# Patient Record
Sex: Male | Born: 1975 | Race: Black or African American | Hispanic: No | Marital: Single | Smoking: Former smoker
Health system: Southern US, Community
[De-identification: ages and names within clinical notes are randomized; demographics above are authoritative.]

## PROBLEM LIST (undated history)

## (undated) DIAGNOSIS — E785 Hyperlipidemia, unspecified: Secondary | ICD-10-CM

## (undated) DIAGNOSIS — J189 Pneumonia, unspecified organism: Secondary | ICD-10-CM

## (undated) DIAGNOSIS — B1920 Unspecified viral hepatitis C without hepatic coma: Secondary | ICD-10-CM

## (undated) DIAGNOSIS — K219 Gastro-esophageal reflux disease without esophagitis: Secondary | ICD-10-CM

## (undated) DIAGNOSIS — F4310 Post-traumatic stress disorder, unspecified: Secondary | ICD-10-CM

## (undated) DIAGNOSIS — F419 Anxiety disorder, unspecified: Secondary | ICD-10-CM

## (undated) DIAGNOSIS — F1910 Other psychoactive substance abuse, uncomplicated: Secondary | ICD-10-CM

## (undated) DIAGNOSIS — R7303 Prediabetes: Secondary | ICD-10-CM

## (undated) DIAGNOSIS — F32A Depression, unspecified: Secondary | ICD-10-CM

## (undated) DIAGNOSIS — F329 Major depressive disorder, single episode, unspecified: Secondary | ICD-10-CM

## (undated) DIAGNOSIS — I10 Essential (primary) hypertension: Secondary | ICD-10-CM

## (undated) DIAGNOSIS — M5430 Sciatica, unspecified side: Secondary | ICD-10-CM

## (undated) HISTORY — DX: Anxiety disorder, unspecified: F41.9

## (undated) HISTORY — DX: Sciatica, unspecified side: M54.30

## (undated) HISTORY — DX: Other psychoactive substance abuse, uncomplicated: F19.10

## (undated) HISTORY — DX: Post-traumatic stress disorder, unspecified: F43.10

## (undated) HISTORY — DX: Hyperlipidemia, unspecified: E78.5

## (undated) HISTORY — PX: OTHER SURGICAL HISTORY: SHX169

## (undated) HISTORY — PX: MOUTH SURGERY: SHX715

## (undated) HISTORY — PX: TESTICLE TORSION REDUCTION: SHX795

---

## 2012-02-24 DIAGNOSIS — F1193 Opioid use, unspecified with withdrawal: Secondary | ICD-10-CM

## 2012-02-24 DIAGNOSIS — F1220 Cannabis dependence, uncomplicated: Secondary | ICD-10-CM | POA: Insufficient documentation

## 2012-02-24 DIAGNOSIS — F1420 Cocaine dependence, uncomplicated: Secondary | ICD-10-CM

## 2012-02-24 HISTORY — DX: Opioid use, unspecified with withdrawal: F11.93

## 2012-02-24 HISTORY — DX: Cocaine dependence, uncomplicated: F14.20

## 2013-02-23 DIAGNOSIS — Z880 Allergy status to penicillin: Secondary | ICD-10-CM | POA: Insufficient documentation

## 2013-02-23 DIAGNOSIS — R45851 Suicidal ideations: Secondary | ICD-10-CM | POA: Insufficient documentation

## 2013-02-23 DIAGNOSIS — F1910 Other psychoactive substance abuse, uncomplicated: Secondary | ICD-10-CM | POA: Insufficient documentation

## 2013-02-23 DIAGNOSIS — F17200 Nicotine dependence, unspecified, uncomplicated: Secondary | ICD-10-CM | POA: Insufficient documentation

## 2013-02-23 DIAGNOSIS — Z8619 Personal history of other infectious and parasitic diseases: Secondary | ICD-10-CM | POA: Insufficient documentation

## 2013-02-23 DIAGNOSIS — F1010 Alcohol abuse, uncomplicated: Secondary | ICD-10-CM | POA: Insufficient documentation

## 2013-02-24 ENCOUNTER — Emergency Department (HOSPITAL_COMMUNITY)
Admission: EM | Admit: 2013-02-24 | Discharge: 2013-02-25 | Disposition: A | Discharge status: Other Acute Inpatient Hospital | Payer: Federal, State, Local not specified - Other | Attending: Emergency Medicine | Admitting: Emergency Medicine

## 2013-02-24 ENCOUNTER — Encounter (HOSPITAL_COMMUNITY): Payer: Self-pay | Admitting: Emergency Medicine

## 2013-02-24 DIAGNOSIS — F329 Major depressive disorder, single episode, unspecified: Secondary | ICD-10-CM

## 2013-02-24 DIAGNOSIS — F1010 Alcohol abuse, uncomplicated: Secondary | ICD-10-CM | POA: Diagnosis present

## 2013-02-24 DIAGNOSIS — F1910 Other psychoactive substance abuse, uncomplicated: Secondary | ICD-10-CM

## 2013-02-24 DIAGNOSIS — F1120 Opioid dependence, uncomplicated: Secondary | ICD-10-CM | POA: Diagnosis present

## 2013-02-24 HISTORY — DX: Unspecified viral hepatitis C without hepatic coma: B19.20

## 2013-02-24 LAB — CBC
HCT: 41.7 % (ref 39.0–52.0)
MCHC: 35.3 g/dL (ref 30.0–36.0)
MCV: 86.2 fL (ref 78.0–100.0)
Platelets: 281 10*3/uL (ref 150–400)
RDW: 13.1 % (ref 11.5–15.5)
WBC: 10.5 10*3/uL (ref 4.0–10.5)

## 2013-02-24 LAB — COMPREHENSIVE METABOLIC PANEL
AST: 19 U/L (ref 0–37)
Albumin: 3.8 g/dL (ref 3.5–5.2)
BUN: 8 mg/dL (ref 6–23)
CO2: 25 mEq/L (ref 19–32)
Chloride: 104 mEq/L (ref 96–112)
Creatinine, Ser: 1.11 mg/dL (ref 0.50–1.35)
Sodium: 138 mEq/L (ref 135–145)
Total Protein: 7.2 g/dL (ref 6.0–8.3)

## 2013-02-24 LAB — RAPID URINE DRUG SCREEN, HOSP PERFORMED
Benzodiazepines: NOT DETECTED
Cocaine: NOT DETECTED
Opiates: POSITIVE — AB

## 2013-02-24 LAB — ACETAMINOPHEN LEVEL: Acetaminophen (Tylenol), Serum: <15 ug/mL (ref 10–30)

## 2013-02-24 LAB — ETHANOL: Alcohol, Ethyl (B): 174 mg/dL — ABNORMAL HIGH (ref 0–11)

## 2013-02-24 LAB — SALICYLATE LEVEL: Salicylate Lvl: <2 mg/dL — ABNORMAL LOW (ref 2.8–20.0)

## 2013-02-24 MED ORDER — LORAZEPAM 1 MG PO TABS
1.0000 mg | ORAL_TABLET | Freq: Three times a day (TID) | ORAL | Status: DC | PRN
  Administered 2013-02-24: 1 mg via ORAL

## 2013-02-24 MED ORDER — LORAZEPAM 1 MG PO TABS
0.0000 mg | ORAL_TABLET | Freq: Four times a day (QID) | ORAL | Status: DC
  Filled 2013-02-24: qty 1

## 2013-02-24 MED ORDER — VITAMIN B-1 100 MG PO TABS
100.0000 mg | ORAL_TABLET | Freq: Every day | ORAL | Status: DC
  Administered 2013-02-25: 10:00:00 100 mg via ORAL
  Filled 2013-02-24: qty 1

## 2013-02-24 MED ORDER — CHLORDIAZEPOXIDE HCL 25 MG PO CAPS: 25.0000 mg | ORAL_CAPSULE | ORAL | Status: DC

## 2013-02-24 MED ORDER — CLONIDINE HCL 0.1 MG PO TABS: 0.1000 mg | ORAL_TABLET | ORAL | Status: DC

## 2013-02-24 MED ORDER — IBUPROFEN 200 MG PO TABS: 600.0000 mg | ORAL_TABLET | Freq: Three times a day (TID) | ORAL | Status: DC | PRN

## 2013-02-24 MED ORDER — LOPERAMIDE HCL 2 MG PO CAPS: 2.0000 mg | ORAL_CAPSULE | ORAL | Status: DC | PRN

## 2013-02-24 MED ORDER — CLONIDINE HCL 0.1 MG PO TABS
0.1000 mg | ORAL_TABLET | Freq: Four times a day (QID) | ORAL | Status: DC
  Administered 2013-02-24 – 2013-02-25 (×3): 0.1 mg via ORAL
  Filled 2013-02-24 (×4): qty 1

## 2013-02-24 MED ORDER — CHLORDIAZEPOXIDE HCL 25 MG PO CAPS
25.0000 mg | ORAL_CAPSULE | Freq: Three times a day (TID) | ORAL | Status: DC
  Administered 2013-02-25: 25 mg via ORAL
  Filled 2013-02-24: qty 1

## 2013-02-24 MED ORDER — NICOTINE 21 MG/24HR TD PT24: 21.0000 mg | MEDICATED_PATCH | Freq: Every day | TRANSDERMAL | Status: DC

## 2013-02-24 MED ORDER — CHLORDIAZEPOXIDE HCL 25 MG PO CAPS: 25.0000 mg | ORAL_CAPSULE | Freq: Four times a day (QID) | ORAL | Status: DC | PRN

## 2013-02-24 MED ORDER — CHLORDIAZEPOXIDE HCL 25 MG PO CAPS
25.0000 mg | ORAL_CAPSULE | Freq: Four times a day (QID) | ORAL | Status: AC
  Administered 2013-02-24 – 2013-02-25 (×3): 25 mg via ORAL
  Filled 2013-02-24 (×3): qty 1

## 2013-02-24 MED ORDER — ONDANSETRON HCL 4 MG PO TABS
4.0000 mg | ORAL_TABLET | Freq: Three times a day (TID) | ORAL | Status: DC | PRN
  Administered 2013-02-24: 4 mg via ORAL
  Filled 2013-02-24: qty 1

## 2013-02-24 MED ORDER — CHLORDIAZEPOXIDE HCL 25 MG PO CAPS: 25.0000 mg | ORAL_CAPSULE | Freq: Every day | ORAL | Status: DC

## 2013-02-24 MED ORDER — METHOCARBAMOL 500 MG PO TABS
500.0000 mg | ORAL_TABLET | Freq: Three times a day (TID) | ORAL | Status: DC | PRN
  Administered 2013-02-24: 500 mg via ORAL
  Filled 2013-02-24: qty 1

## 2013-02-24 MED ORDER — ONDANSETRON 4 MG PO TBDP: 4.0000 mg | ORAL_TABLET | Freq: Four times a day (QID) | ORAL | Status: DC | PRN

## 2013-02-24 MED ORDER — CHLORDIAZEPOXIDE HCL 25 MG PO CAPS
25.0000 mg | ORAL_CAPSULE | Freq: Once | ORAL | Status: AC
  Administered 2013-02-24: 25 mg via ORAL
  Filled 2013-02-24: qty 1

## 2013-02-24 MED ORDER — CLONIDINE HCL 0.1 MG PO TABS: 0.1000 mg | ORAL_TABLET | Freq: Every day | ORAL | Status: DC

## 2013-02-24 MED ORDER — HYDROXYZINE HCL 25 MG PO TABS
25.0000 mg | ORAL_TABLET | Freq: Four times a day (QID) | ORAL | Status: DC | PRN
  Administered 2013-02-24: 25 mg via ORAL
  Filled 2013-02-24: qty 1

## 2013-02-24 MED ORDER — DICYCLOMINE HCL 20 MG PO TABS
20.0000 mg | ORAL_TABLET | Freq: Four times a day (QID) | ORAL | Status: DC | PRN
  Administered 2013-02-24 – 2013-02-25 (×2): 20 mg via ORAL
  Filled 2013-02-24 (×2): qty 1

## 2013-02-24 MED ORDER — LORAZEPAM 1 MG PO TABS: 0.0000 mg | ORAL_TABLET | Freq: Two times a day (BID) | ORAL | Status: DC

## 2013-02-24 MED ORDER — NAPROXEN 500 MG PO TABS: 500.0000 mg | ORAL_TABLET | Freq: Two times a day (BID) | ORAL | Status: DC | PRN

## 2013-02-24 MED ORDER — ADULT MULTIVITAMIN W/MINERALS CH
1.0000 | ORAL_TABLET | Freq: Every day | ORAL | Status: DC
  Administered 2013-02-24 – 2013-02-25 (×2): 1 via ORAL
  Filled 2013-02-24 (×2): qty 1

## 2013-02-24 MED ORDER — THIAMINE HCL 100 MG/ML IJ SOLN
100.0000 mg | Freq: Once | INTRAMUSCULAR | Status: AC
  Administered 2013-02-24: 100 mg via INTRAMUSCULAR
  Filled 2013-02-24: qty 2

## 2013-02-24 NOTE — ED Notes (Signed)
Pt and belonging wanded.

## 2013-02-24 NOTE — ED Provider Notes (Signed)
CSN: [id number]     Arrival date & time 02/23/13  2339 History   First MD Initiated Contact with Patient 02/24/13 0043     Chief Complaint  Patient presents with  . Medical Clearance   (Consider location/radiation/quality/duration/timing/severity/associated sxs/prior Treatment) HPI Comments: Agent states he uses a plethora of drugs and alcohol.  States he spoke to a mark.  Today.  I was told to come to the hospital for medical clearance.  He he, states he thought about suicide, but has no active plan.  He thought about him is because nobody that he really wants to hurt.   The history is provided by the patient.    Past Medical History  Diagnosis Date  . Hepatitis C    History reviewed. No pertinent past surgical history. No family history on file. History  Substance Use Topics  . Smoking status: Current Every Day Smoker -- 1.00 packs/day    Types: Cigarettes  . Smokeless tobacco: Not on file  . Alcohol Use: Yes     Comment: "alot"    Review of Systems  Constitutional: Negative for fever.  Gastrointestinal: Negative for abdominal pain.  Neurological: Negative for dizziness and headaches.  Psychiatric/Behavioral: Negative for suicidal ideas, hallucinations and sleep disturbance. The patient is not nervous/anxious and is not hyperactive.   All other systems reviewed and are negative.    Allergies  Fish allergy and Penicillins  Home Medications   Current Outpatient Rx  Name  Route  Sig  Dispense  Refill  . ibuprofen (ADVIL,MOTRIN) 200 MG tablet   Oral   Take 600 mg by mouth every 6 (six) hours as needed for pain.          BP 120/72  Pulse 58  Temp(Src) 97.6 F (36.4 C) (Oral)  Resp 16  SpO2 94% Physical Exam  Nursing note and vitals reviewed. Constitutional: He is oriented to person, place, and time. He appears well-developed and well-nourished.  HENT:  Head: Normocephalic.  Eyes: Pupils are equal, round, and reactive to light.  Neck: Normal range of  motion.  Cardiovascular: Normal rate and regular rhythm.   Pulmonary/Chest: Effort normal.  Musculoskeletal: Normal range of motion.  Neurological: He is alert and oriented to person, place, and time.  Skin: Skin is warm.  Psychiatric: He has a normal mood and affect. Judgment normal. His speech is slurred. Cognition and memory are normal. He expresses no suicidal plans and no homicidal plans.    ED Course  Procedures (including critical care time) Labs Review Labs Reviewed  ACETAMINOPHEN LEVEL  CBC  COMPREHENSIVE METABOLIC PANEL  ETHANOL  SALICYLATE LEVEL  URINE RAPID DRUG SCREEN (HOSP PERFORMED)   Imaging Review No results found.  EKG Interpretation   None       MDM  No diagnosis found.  Polysubstance abuse    Arman Filter, NP 02/24/13 [phone]

## 2013-02-24 NOTE — Consult Note (Signed)
[facility] and [facility] will not accept pt due to seizure history indicated in assessment.  While it is uncertain whether or not pt does in fact have a seizure hx or it is related to detox is not relevant to [facility] and [facility] protocol.    Pt can either remain in ER to detox and then go to [facility] per pt plan or be considered for [facility] once they have a bed.
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
## 2013-02-24 NOTE — BH Assessment (Signed)
Dr. Mervyn Gay accepted Pt pending 300-hall bed. Per Laverle Hobby, [facility], adult unit is at capacity. Contacted the following facilities for placement:  [facility]: At capacity [facility]: At capacity [facility]: At capacity [facility]: At capacity [facility] At [facility]: At capacity [facility]: At capacity [facility]: At capacity [facility]: At capacity  [facility]: Does not accept detox patients [facility]: Does not accept detox patients [facility]: Declined due to history of seizures [facility]: Declined due to history of seizures   Pamalee Leyden, [facility], [facility] Triage Specialist

## 2013-02-24 NOTE — ED Provider Notes (Signed)
Medical screening examination/treatment/procedure(s) were performed by non-physician practitioner and as supervising physician I was immediately available for consultation/collaboration.  Braley Luckenbaugh M Alila Sotero, MD 02/24/13 0301 

## 2013-02-24 NOTE — ED Notes (Signed)
Pt states "I need help getting off drugs";  When asked what drugs pt states "all of them"; denies SI / HI; + ETOH

## 2013-02-24 NOTE — ED Notes (Signed)
Spoke with Shuvon Rankin NP about giving loading dose of Librium now, stated OK to give.
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
## 2013-02-25 ENCOUNTER — Inpatient Hospital Stay (HOSPITAL_COMMUNITY)
Admission: AD | Admit: 2013-02-25 | Discharge: 2013-03-05 | DRG: 897 | Disposition: A | Discharge status: Home / Self Care | Payer: Federal, State, Local not specified - Other | Source: Intra-hospital | Attending: Psychiatry | Admitting: Psychiatry

## 2013-02-25 ENCOUNTER — Encounter (HOSPITAL_COMMUNITY): Payer: Self-pay | Admitting: *Deleted

## 2013-02-25 DIAGNOSIS — F1994 Other psychoactive substance use, unspecified with psychoactive substance-induced mood disorder: Secondary | ICD-10-CM | POA: Diagnosis present

## 2013-02-25 DIAGNOSIS — B1920 Unspecified viral hepatitis C without hepatic coma: Secondary | ICD-10-CM | POA: Diagnosis present

## 2013-02-25 DIAGNOSIS — F1010 Alcohol abuse, uncomplicated: Secondary | ICD-10-CM

## 2013-02-25 DIAGNOSIS — F4310 Post-traumatic stress disorder, unspecified: Secondary | ICD-10-CM | POA: Diagnosis present

## 2013-02-25 DIAGNOSIS — F1120 Opioid dependence, uncomplicated: Secondary | ICD-10-CM | POA: Diagnosis present

## 2013-02-25 DIAGNOSIS — F39 Unspecified mood [affective] disorder: Secondary | ICD-10-CM

## 2013-02-25 DIAGNOSIS — F1920 Other psychoactive substance dependence, uncomplicated: Principal | ICD-10-CM | POA: Diagnosis present

## 2013-02-25 DIAGNOSIS — Z79899 Other long term (current) drug therapy: Secondary | ICD-10-CM

## 2013-02-25 MED ORDER — NICOTINE 21 MG/24HR TD PT24
21.0000 mg | MEDICATED_PATCH | Freq: Every day | TRANSDERMAL | Status: DC
  Administered 2013-02-26 – 2013-03-04 (×8): 21 mg via TRANSDERMAL
  Filled 2013-02-25 (×11): qty 1

## 2013-02-25 MED ORDER — DICYCLOMINE HCL 20 MG PO TABS: 20.0000 mg | ORAL_TABLET | Freq: Four times a day (QID) | ORAL | Status: AC | PRN

## 2013-02-25 MED ORDER — CHLORDIAZEPOXIDE HCL 25 MG PO CAPS
25.0000 mg | ORAL_CAPSULE | Freq: Once | ORAL | Status: DC
  Filled 2013-02-25 (×2): qty 1

## 2013-02-25 MED ORDER — ADULT MULTIVITAMIN W/MINERALS CH
1.0000 | ORAL_TABLET | Freq: Every day | ORAL | Status: DC
  Administered 2013-02-26 – 2013-03-04 (×7): 1 via ORAL
  Filled 2013-02-25 (×10): qty 1

## 2013-02-25 MED ORDER — METHOCARBAMOL 500 MG PO TABS
500.0000 mg | ORAL_TABLET | Freq: Three times a day (TID) | ORAL | Status: AC | PRN
  Administered 2013-02-26 – 2013-03-02 (×5): 500 mg via ORAL
  Filled 2013-02-25 (×4): qty 1

## 2013-02-25 MED ORDER — THIAMINE HCL 100 MG/ML IJ SOLN: 100.0000 mg | Freq: Once | INTRAMUSCULAR | Status: DC

## 2013-02-25 MED ORDER — CHLORDIAZEPOXIDE HCL 25 MG PO CAPS
25.0000 mg | ORAL_CAPSULE | Freq: Three times a day (TID) | ORAL | Status: AC
  Administered 2013-02-27 (×3): 25 mg via ORAL
  Filled 2013-02-25 (×3): qty 1

## 2013-02-25 MED ORDER — CLONIDINE HCL 0.1 MG PO TABS
0.1000 mg | ORAL_TABLET | ORAL | Status: AC
  Administered 2013-02-28 – 2013-03-01 (×4): 0.1 mg via ORAL
  Filled 2013-02-25 (×4): qty 1

## 2013-02-25 MED ORDER — MAGNESIUM HYDROXIDE 400 MG/5ML PO SUSP
30.0000 mL | Freq: Every day | ORAL | Status: DC | PRN
  Administered 2013-02-28: 30 mL via ORAL

## 2013-02-25 MED ORDER — NAPROXEN 500 MG PO TABS
500.0000 mg | ORAL_TABLET | Freq: Two times a day (BID) | ORAL | Status: AC | PRN
  Administered 2013-02-26 – 2013-02-27 (×2): 500 mg via ORAL
  Filled 2013-02-25 (×2): qty 1

## 2013-02-25 MED ORDER — VITAMIN B-1 100 MG PO TABS
100.0000 mg | ORAL_TABLET | Freq: Every day | ORAL | Status: DC
  Administered 2013-02-26 – 2013-03-04 (×7): 100 mg via ORAL
  Filled 2013-02-25 (×9): qty 1

## 2013-02-25 MED ORDER — CHLORDIAZEPOXIDE HCL 25 MG PO CAPS: 25.0000 mg | ORAL_CAPSULE | Freq: Four times a day (QID) | ORAL | Status: AC | PRN

## 2013-02-25 MED ORDER — ACETAMINOPHEN 325 MG PO TABS: 650.0000 mg | ORAL_TABLET | Freq: Four times a day (QID) | ORAL | Status: DC | PRN

## 2013-02-25 MED ORDER — CHLORDIAZEPOXIDE HCL 25 MG PO CAPS
25.0000 mg | ORAL_CAPSULE | Freq: Every day | ORAL | Status: AC
Start: 2013-03-01 — End: 2013-03-01
  Administered 2013-03-01: 25 mg via ORAL
  Filled 2013-02-25: qty 1

## 2013-02-25 MED ORDER — CHLORDIAZEPOXIDE HCL 25 MG PO CAPS
25.0000 mg | ORAL_CAPSULE | ORAL | Status: AC
  Administered 2013-02-28 (×2): 25 mg via ORAL
  Filled 2013-02-25 (×2): qty 1

## 2013-02-25 MED ORDER — CLONIDINE HCL 0.1 MG PO TABS
0.1000 mg | ORAL_TABLET | Freq: Every day | ORAL | Status: AC
  Administered 2013-03-02 – 2013-03-03 (×2): 0.1 mg via ORAL
  Filled 2013-02-25 (×2): qty 1

## 2013-02-25 MED ORDER — ALUM & MAG HYDROXIDE-SIMETH 200-200-20 MG/5ML PO SUSP: 30.0000 mL | ORAL | Status: DC | PRN

## 2013-02-25 MED ORDER — CLONIDINE HCL 0.1 MG PO TABS
0.1000 mg | ORAL_TABLET | Freq: Four times a day (QID) | ORAL | Status: AC
  Administered 2013-02-25 – 2013-02-27 (×10): 0.1 mg via ORAL
  Filled 2013-02-25 (×10): qty 1

## 2013-02-25 MED ORDER — CHLORDIAZEPOXIDE HCL 25 MG PO CAPS
25.0000 mg | ORAL_CAPSULE | Freq: Four times a day (QID) | ORAL | Status: AC
  Administered 2013-02-25 – 2013-02-26 (×6): 25 mg via ORAL
  Filled 2013-02-25 (×5): qty 1

## 2013-02-25 NOTE — Tx Team (Signed)
Initial Interdisciplinary Treatment Plan  PATIENT STRENGTHS: (choose at least two) Ability for insight Capable of independent living General fund of knowledge Physical Health Work skills  PATIENT STRESSORS: Financial difficulties Marital or family conflict Substance abuse   PROBLEM LIST: Problem List/Patient Goals Date to be addressed Date deferred Reason deferred Estimated date of resolution  Substance abuse 02/25/13                                                      DISCHARGE CRITERIA:  Ability to meet basic life and health needs Improved stabilization in mood, thinking, and/or behavior Motivation to continue treatment in a less acute level of care Need for constant or close observation no longer present Reduction of life-threatening or endangering symptoms to within safe limits Verbal commitment to aftercare and medication compliance Withdrawal symptoms are absent or subacute and managed without 24-hour nursing intervention  PRELIMINARY DISCHARGE PLAN: Attend aftercare/continuing care group Attend 12-step recovery group  PATIENT/FAMIILY INVOLVEMENT: This treatment plan has been presented to and reviewed with the patient, Todd [name], and/or family member, .  The patient and family have been given the opportunity to ask questions and make suggestions.  Todd [name] 02/25/2013, 5:19 PM

## 2013-02-25 NOTE — Consult Note (Signed)
  Patient Identification:  Todd [name] Date of Evaluation:  02/25/2013   History of Present Illness: Patient is seeking detox from multiple drugs and alcohol.  Patient states he has been using different drugs and alcohol since age 37.   He also reports long standing depression with out treatment. He states he is tired of wasting his money and need detox.  We will use our Librium protocol for his detox.  He denies SI/HI/AVH.   Past Psychiatric History:  Polysubstance dependence   Past Medical History:     Past Medical History  Diagnosis Date  . Hepatitis C       History reviewed. No pertinent past surgical history.  Allergies:  Allergies  Allergen Reactions  . Fish Allergy Anaphylaxis  . Penicillins Anaphylaxis    Current Medications:  Prior to Admission medications   Medication Sig Start Date End Date Taking? Authorizing Provider  ibuprofen (ADVIL,MOTRIN) 200 MG tablet Take 600 mg by mouth every 6 (six) hours as needed for pain.   Yes Historical Provider, MD    Social History:    reports that he has been smoking Cigarettes.  He has been smoking about 1.00 pack per day. He does not have any smokeless tobacco history on file. He reports that he drinks alcohol. He reports that he uses illicit drugs.   Family History:    No family history on file.  Mental Status Examination/Evaluation:Psychiatric Specialty Exam: Physical Exam  ROS  Blood pressure 138/88, pulse 50, temperature 98.7 F (37.1 C), temperature source Oral, resp. rate 22, SpO2 98.00%.There is no height or weight on file to calculate BMI.  General Appearance: Casual  Eye Contact::  Good  Speech:  Clear and Coherent and Normal Rate  Volume:  Normal  Mood:  Depressed  Affect:  Congruent, Depressed and Flat  Thought Process:  Coherent and Goal Directed  Orientation:  Full (Time, Place, and Person)  Thought Content:  NA  Suicidal Thoughts:  No  Homicidal Thoughts:  No  Memory:  Immediate;   Good Recent;    Good Remote;   Good  Judgement:  Fair  Insight:  Fair  Psychomotor Activity:  Tremor  Concentration:  Good  Recall:  NA  Akathisia:  NA  Handed:  Right  AIMS (if indicated):     Assets:  Desire for Improvement  Sleep:          DIAGNOSIS:   AXIS I   Depressive Disorder NOS and Substance Abuse   AXIS II  Deffered  AXIS III See medical notes.  AXIS IV other psychosocial or environmental problems and problems related to social environment  AXIS V 41-50 serious symptoms     Assessment/Plan:  Face to face interview with Dr Lolly Mustache We have accepted patient to our inpatient psychiatric unit for detox with librium protocol.  I have personally seen the patient and agreed with the findings and involved in the treatment plan. Kathryne Sharper, MD

## 2013-02-25 NOTE — Progress Notes (Signed)
Pt observed in the dayroom watching TV and interacting with his peers.  Pt reports he is doing fine at this point.  He denies any withdrawal symptoms at this time.  He denies SI/HI/AV.  Pt was encouraged to make his needs known to staff and reminded that he was on a protocol for withdrawal symptoms.  Pt voiced understanding.  Support and encouragement offered.  Safety maintained with q15 minute checks.

## 2013-02-25 NOTE — Progress Notes (Signed)
Attended AA group 

## 2013-02-25 NOTE — Progress Notes (Signed)
[facility] provided patient with a [employer] Orange Card application, highlighting [facility].  °
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
## 2013-02-26 ENCOUNTER — Encounter (HOSPITAL_COMMUNITY): Payer: Self-pay | Admitting: Psychiatry

## 2013-02-26 DIAGNOSIS — F411 Generalized anxiety disorder: Secondary | ICD-10-CM

## 2013-02-26 DIAGNOSIS — F1994 Other psychoactive substance use, unspecified with psychoactive substance-induced mood disorder: Secondary | ICD-10-CM | POA: Diagnosis present

## 2013-02-26 DIAGNOSIS — F39 Unspecified mood [affective] disorder: Secondary | ICD-10-CM

## 2013-02-26 DIAGNOSIS — F4310 Post-traumatic stress disorder, unspecified: Secondary | ICD-10-CM | POA: Diagnosis present

## 2013-02-26 MED ORDER — TRAZODONE HCL 100 MG PO TABS
200.0000 mg | ORAL_TABLET | Freq: Every day | ORAL | Status: DC
  Administered 2013-02-26 – 2013-03-04 (×7): 200 mg via ORAL
  Filled 2013-02-26 (×8): qty 2

## 2013-02-26 MED ORDER — QUETIAPINE FUMARATE 50 MG PO TABS
50.0000 mg | ORAL_TABLET | Freq: Four times a day (QID) | ORAL | Status: DC | PRN
  Administered 2013-02-26 – 2013-03-04 (×8): 50 mg via ORAL
  Filled 2013-02-26 (×5): qty 1

## 2013-02-26 MED ORDER — GABAPENTIN 100 MG PO CAPS
100.0000 mg | ORAL_CAPSULE | Freq: Three times a day (TID) | ORAL | Status: DC
  Administered 2013-02-26 – 2013-02-27 (×3): 100 mg via ORAL
  Filled 2013-02-26 (×6): qty 1

## 2013-02-26 NOTE — Progress Notes (Signed)
The focus of this group is to educate the patient on the purpose and policies of crisis stabilization and provide a format to answer questions about their admission.  The group details unit policies and expectations of patients while admitted.  Patient attended the group, but was called out by Dr. Dub Mikes and did not return until the group was nearly over.
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
## 2013-02-26 NOTE — Progress Notes (Signed)
Pt attended A/A 

## 2013-02-26 NOTE — BHH Suicide Risk Assessment (Addendum)
BHH INPATIENT: Family/Significant Other Suicide Prevention Education  Suicide Prevention Education:  Education Completed; No one has been identified by the patient as the family member/significant other with whom the patient will be residing, and identified as the person(s) who will aid the patient in the event of a mental health crisis (suicidal ideations/suicide attempt).   Pt refused to consent to family contact for SPE. SPE completed with pt. SPI pamphlet provided to pt and he was encouraged to ask questions, talk about concerns, and share with his support network.   The Sherwin-Bidinger, LCSWA 02/26/2013 10:47 AM

## 2013-02-26 NOTE — Progress Notes (Signed)
Recreation Therapy Notes  Date: 10.14.2014 Time: 3:00pm Location: [address] Dayroom  Group Topic: Communication, Team Building, Problem Solving  Goal Area(s) Addresses:  Patient will effectively work with peer towards shared goal.  Patient will identify skill used to make activity successful.  Patient will identify how skills used during activity can be used to reach post d/c goals.   Behavioral Response: Engaged, Attentive, Apporpriate  Intervention: Problem Solving Activitiy  Activity: Life Boat. Patients were given a scenario about being on a sinking yacht. Patients were informed the yacht included 15 guest, 8 of which could be placed on the life boat, along with all group members. Individuals on guest list were of varying socioeconomic classes such as a Education officer, museum, Materials engineer, Midwife, Tree surgeon.   Education: Customer service manager, Discharge Planning   Education Outcome: Acknowledges understanding  Clinical Observations/Feedback: Patient actively engaged in activity, voicing his opinion and debating appropriately with group members. Patient identified skill set and logic as qualities that guided decision making process. Patient additionally identified problem solving as a skill necessary to make activity successful. Patient identified importance of using skills used in group session to facilitate recovery.    Marykay Lex Larenda Reedy, LRT/CTRS  Jearl Klinefelter 02/26/2013 4:51 PM
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
## 2013-02-26 NOTE — Progress Notes (Signed)
D:  Patient up and active in the milieu most of the shift.  Attending and participating in groups.  Rates depression at 7 and hopelessness at 8, but affect has been bright most of the shift.  States sleep is fair.  Has stayed up today.  A:  Medications given as prescribed.  Encouraged participation in all groups.  Educated on specific medications.   R:  Cooperative with staff.  Interacting well with peers.  Tolerating medications.  Safety is maintained.
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
## 2013-02-26 NOTE — Progress Notes (Signed)
Adult Psychoeducational Group Note  Date:  02/26/2013 Time:  1:34 PM  Group Topic/Focus:  Recovery Goals:   The focus of this group is to identify appropriate goals for recovery and establish a plan to achieve them.  Participation Level:  Active  Participation Quality:  Appropriate, Drowsy, Sharing and Supportive  Affect:  Appropriate and Flat  Cognitive:  Appropriate  Insight: Appropriate and Good  Engagement in Group:  Engaged and Supportive  Modes of Intervention:  Discussion and Education  Additional Comments:  Pt stated one change he wants to make is change his friends and the people he hangs around. Pt stated he also wants to get a job and keep a job. Pt stated he doesn't have a problem getting a job, only keeping it.  Caswell Corwin 02/26/2013, 1:34 PM
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
## 2013-02-26 NOTE — [facility] Group Notes (Signed)
[facility] LCSW Group Therapy  02/26/2013 1:28 PM  Type of Therapy:  Group Therapy  Participation Level:  Minimal   Participation Quality:  Drowsy  Affect:  Lethargic  Cognitive:  Limited   Insight:  Limited  Engagement in Therapy:  Limited  Modes of Intervention:  Discussion, Education, Socialization and Support  Summary of Progress/Problems: MHA Speaker came to talk about his personal journey with substance abuse and addiction. The pt processed ways by which to relate to the speaker. MHA speaker provided handouts and educational information pertaining to groups and services offered by the [facility]. Todd [name] was drowsy and inattentive throughout today's therapy group. Todd [name] quickly fell asleep and rested quietly throughout speaker's presentation. He does not appear to be making progress in the group setting at this time.   Smart, Todd [name] 02/26/2013, 1:28 PM

## 2013-02-26 NOTE — Progress Notes (Signed)
Recreation Therapy Notes  Date: 10.14.2014 Time: 2:30pm Location: [address] Dayroom  Group Topic: Software engineer Activities (AAA)  Behavioral Response: Engaged, Attentive  Affect: Euthymic  Clinical Observations/Feedback: Dog Team: Education officer, museum. Patient interacted appropriately with peer, dog team, LRT and MHT.   Marykay Lex Marciano Mundt, LRT/CTRS  Jearl Klinefelter 02/26/2013 3:56 PM

## 2013-02-27 MED ORDER — QUETIAPINE FUMARATE 50 MG PO TABS
50.0000 mg | ORAL_TABLET | Freq: Three times a day (TID) | ORAL | Status: DC
  Administered 2013-02-27 – 2013-03-01 (×7): 50 mg via ORAL
  Filled 2013-02-27 (×10): qty 1

## 2013-02-27 MED ORDER — GABAPENTIN 100 MG PO CAPS
200.0000 mg | ORAL_CAPSULE | Freq: Three times a day (TID) | ORAL | Status: DC
  Administered 2013-02-27 – 2013-02-28 (×4): 200 mg via ORAL
  Filled 2013-02-27 (×6): qty 2

## 2013-02-27 NOTE — Progress Notes (Signed)
[facility] MD Progress Note  02/27/2013 2:42 PM Todd [name]  MRN:  [id number] Subjective:  Todd [name] continues to endorse anxiety, agitation. He is having a hard time with the withdrawal. He would like more help with the anxiety, the agitation. States that when it gets to this point he feels it is all hopeless, and starts feeling like giving up. This state of mind sets the stage for feeling more depressed, and suicidal. Sleep is still an issue although he slept some better last night. Diagnosis:   DSM5: Schizophrenia Disorders:   Obsessive-Compulsive Disorders:   Trauma-Stressor Disorders:  Posttraumatic Stress Disorder (309.81) Substance/Addictive Disorders:  Alcohol Related Disorder - Severe (303.90), Cannabis Use Disorder - Moderate 9304.30) and Opioid Disorder - Severe (304.00) Depressive Disorders:  Major Depressive Disorder - Moderate (296.22)  Axis I: Anxiety Disorder NOS and Mood Disorder NOS  ADL's:  Intact  Sleep: Fair  Appetite:  Fair  Suicidal Ideation:  Plan:  denies Intent:  denies Means:  denies Homicidal Ideation:  Plan:  denies Intent:  denies Means:  denies AEB (as evidenced by):  Psychiatric Specialty Exam: Review of Systems  Constitutional: Positive for malaise/fatigue.  HENT: Negative.   Eyes: Negative.   Respiratory: Negative.   Cardiovascular: Negative.   Gastrointestinal: Negative.   Genitourinary: Negative.   Musculoskeletal: Positive for myalgias.  Skin: Negative.   Neurological: Positive for dizziness.  Endo/Heme/Allergies: Negative.   Psychiatric/Behavioral: Positive for depression and substance abuse. The patient is nervous/anxious and has insomnia.     Blood pressure 130/91, pulse 93, temperature 97.1 F (36.2 C), temperature source Oral, resp. rate 18, height 6' (1.829 m), weight 89.359 kg (197 lb).Body mass index is 26.71 kg/(m^2).  General Appearance: Fairly Groomed  Patent attorney::  Fair  Speech:  Clear and Coherent  Volume:  Decreased   Mood:  Anxious, Depressed, Dysphoric and Irritable  Affect:  Restricted and anxious, worried  Thought Process:  Coherent and Goal Directed  Orientation:  Full (Time, Place, and Person)  Thought Content:  worries, concerns, symptoms, fear of losing control  Suicidal Thoughts:  Intermittent  Homicidal Thoughts:  No  Memory:  Immediate;   Fair Recent;   Fair Remote;   Fair  Judgement:  Fair  Insight:  Present and Shallow  Psychomotor Activity:  Restlessness  Concentration:  Fair  Recall:  Fair  Akathisia:  No  Handed:    AIMS (if indicated):     Assets:  Desire for Improvement  Sleep:  Number of Hours: 6.75   Current Medications: Current Facility-Administered Medications  Medication Dose Route Frequency Provider Last Rate Last Dose  . alum & mag hydroxide-simeth (MAALOX/MYLANTA) 200-200-20 MG/5ML suspension 30 mL  30 mL Oral Q4H PRN Nanine Means, NP      . chlordiazePOXIDE (LIBRIUM) capsule 25 mg  25 mg Oral Q6H PRN Nanine Means, NP      . chlordiazePOXIDE (LIBRIUM) capsule 25 mg  25 mg Oral Once Nanine Means, NP      . chlordiazePOXIDE (LIBRIUM) capsule 25 mg  25 mg Oral TID Nanine Means, NP   25 mg at 02/27/13 1210   Followed by  . [START ON 02/28/2013] chlordiazePOXIDE (LIBRIUM) capsule 25 mg  25 mg Oral BH-qamhs Nanine Means, NP       Followed by  . [START ON 03/01/2013] chlordiazePOXIDE (LIBRIUM) capsule 25 mg  25 mg Oral Daily Nanine Means, NP      . cloNIDine (CATAPRES) tablet 0.1 mg  0.1 mg Oral QID Nanine Means, NP   0.1  mg at 02/27/13 1207   Followed by  . [START ON 02/28/2013] cloNIDine (CATAPRES) tablet 0.1 mg  0.1 mg Oral BH-qamhs Nanine Means, NP       Followed by  . [START ON 03/02/2013] cloNIDine (CATAPRES) tablet 0.1 mg  0.1 mg Oral QAC breakfast Nanine Means, NP      . dicyclomine (BENTYL) tablet 20 mg  20 mg Oral Q6H PRN Nanine Means, NP      . gabapentin (NEURONTIN) capsule 200 mg  200 mg Oral TID Rachael Fee, MD   200 mg at 02/27/13 1207  . magnesium  hydroxide (MILK OF MAGNESIA) suspension 30 mL  30 mL Oral Daily PRN Nanine Means, NP      . methocarbamol (ROBAXIN) tablet 500 mg  500 mg Oral Q8H PRN Nanine Means, NP   500 mg at 02/27/13 1610  . multivitamin with minerals tablet 1 tablet  1 tablet Oral Daily Nanine Means, NP   1 tablet at 02/27/13 0910  . naproxen (NAPROSYN) tablet 500 mg  500 mg Oral BID PRN Nanine Means, NP   500 mg at 02/27/13 0917  . nicotine (NICODERM CQ - dosed in mg/24 hours) patch 21 mg  21 mg Transdermal Daily Nanine Means, NP   21 mg at 02/27/13 1208  . QUEtiapine (SEROQUEL) tablet 50 mg  50 mg Oral Q6H PRN Rachael Fee, MD   50 mg at 02/26/13 2123  . QUEtiapine (SEROQUEL) tablet 50 mg  50 mg Oral TID Rachael Fee, MD   50 mg at 02/27/13 1206  . thiamine (B-1) injection 100 mg  100 mg Intramuscular Once Nanine Means, NP      . thiamine (VITAMIN B-1) tablet 100 mg  100 mg Oral Daily Nanine Means, NP   100 mg at 02/27/13 0912  . traZODone (DESYREL) tablet 200 mg  200 mg Oral QHS Rachael Fee, MD   200 mg at 02/26/13 2122    Lab Results: No results found for this or any previous visit (from the past 48 hour(s)).  Physical Findings: AIMS: Facial and Oral Movements Muscles of Facial Expression: None, normal Lips and Perioral Area: None, normal Jaw: None, normal Tongue: None, normal,Extremity Movements Upper (arms, wrists, hands, fingers): None, normal Lower (legs, knees, ankles, toes): None, normal, Trunk Movements Neck, shoulders, hips: None, normal, Overall Severity Severity of abnormal movements (highest score from questions above): None, normal Incapacitation due to abnormal movements: None, normal Patient's awareness of abnormal movements (rate only patient's report): No Awareness, Dental Status Current problems with teeth and/or dentures?: No Does patient usually wear dentures?: No  CIWA:  CIWA-Ar Total: 0 COWS:  COWS Total Score: 1  Treatment Plan Summary: Daily contact with patient to assess and  evaluate symptoms and progress in treatment Medication management  Plan: Supportive approach/coping skills/relapse prevention           Continue detox protocol           Seroquel 50 mg TID  Medical Decision Making Problem Points:  Review of psycho-social stressors (1) Data Points:  Review of medication regiment & side effects (2) Review of new medications or change in dosage (2)  I certify that inpatient services furnished can reasonably be expected to improve the patient's condition.   Todd [name] A 02/27/2013, 2:42 PM

## 2013-02-27 NOTE — [facility] Group Notes (Signed)
[facility] LCSW Aftercare Discharge Planning Group Note   02/27/2013 9:30 AM  Participation Quality:  Appropriate   Mood/Affect:  Lethargic   Depression Rating:  7  Anxiety Rating:  8  Thoughts of Suicide:  No Will you contract for safety?   NA  Current AVH:  No  Plan for Discharge/Comments:  Pt reporting severe withdrawal symptoms today and stated that he slept poorly last night. Pt has Tues. Admission into [facility] and plans to follow up at [facility] for med management. He has screening on Friday at [facility]. [facility] referral sent as well.   Transportation Means: bus   Supports: none identified  Counselling psychologist, [employer]

## 2013-02-27 NOTE — [facility] Group Notes (Signed)
[facility] LCSW Group Therapy  02/27/2013 3:11 PM  Type of Therapy:  Group Therapy  Participation Level:  Did Not Attend  Smart, Todd [name] 02/27/2013, 3:11 PM

## 2013-02-27 NOTE — Progress Notes (Signed)
D: Patient denies SI/HI or AVH. Patient has a depressed mood and a flat affect.   A: Patient given emotional support from RN. Patient encouraged to come to staff with concerns and/or questions. Patient's medication routine continued. Patient's orders and plan of care reviewed.   R: Patient remains appropriate and cooperative. Will continue to monitor patient q15 minutes for safety.

## 2013-02-27 NOTE — Tx Team (Signed)
Interdisciplinary Treatment Plan Update (Adult)  Date: 02/27/2013   Time Reviewed: 10:58 AM  Progress in Treatment:  Attending groups: Yes Participating in groups:  Minimally   Taking medication as prescribed: Yes  Tolerating medication: Yes  Family/Significant othe contact made: No. SPE not required for this pt.  Patient understands diagnosis: Yes, AEB seeking treatment for ETOH detox and depression.  Discussing patient identified problems/goals with staff: Yes  Medical problems stabilized or resolved: Yes  Denies suicidal/homicidal ideation: Yes during admission, group, and self report.  Patient has not harmed self or Others: Yes  New problem(s) identified: Pt reporting severe withdrawals and difficulty sleeping. Pt has history of seizures and would not be good candidate for [facility].   Discharge Plan or Barriers: Pt has admission date for [facility] on Tues. 10/21 and plans to follow up at [facility] for med management. Pt reports that he will not make it if d/ced prior to [facility] date. CSW gave pt list of shelters/[place] [facility] in order to prepare for possible d/c prior to Tuesday.  Additional comments: Patient came to [facility] seeking assistance with detox. Pt had gotten in touch with [facility] and they said he had to be detoxed before coming in for rehabilitation bed. Patient has no SI, HI or A/V hallucinations. Patient is using a plethora of drugs along with ETOH. Patient reports drinking at least a 5th of rum daily over the last year. He last drank tonight (10/11) prior to arrival. Patient reports using "about a gram" of IV heroin daily for the last 7 months. Last use of heroin was 10/11 also. Patient uses cocaine and reports using about $70-$100 2-3 times in a week. Patient uses marijuana daily also. Patient did report that he had a seizure about 5 years ago. Questionable about whether it was during detox. No other seizure activity reported. Patient reports being in a detox facility in [facility]  about a year ago. Patient repeatedly asked for help with his SA problem. Reason for Continuation of Hospitalization: Librium/Clonidine taper-withdrawals Mood stabilization Medication management  Estimated length of stay: 3-5 days  For review of initial/current patient goals, please see plan of care.  Attendees:  Patient:    Family:    Physician: Geoffery Lyons MD 02/27/2013 10:57 AM   Nursing: Lupita Leash RN  02/27/2013 10:57 AM   Clinical Social Worker Kalil Woessner Smart, LCSWA  02/27/2013 10:57 AM   Other: Cicero Duck RN 02/27/2013 10:57 AM   Other:    Other: Darden Dates Nurse CM  02/27/2013 10:57 AM   Other:    Scribe for Treatment Team:  [facility] LCSWA 02/27/2013 10:58 AM

## 2013-02-27 NOTE — Progress Notes (Signed)
Attended NA Meeting 

## 2013-02-28 LAB — HEPATITIS PANEL, ACUTE
HCV Ab: REACTIVE — AB
Hepatitis B Surface Ag: NEGATIVE

## 2013-02-28 MED ORDER — GABAPENTIN 300 MG PO CAPS
300.0000 mg | ORAL_CAPSULE | Freq: Three times a day (TID) | ORAL | Status: DC
  Administered 2013-02-28 – 2013-03-04 (×13): 300 mg via ORAL
  Filled 2013-02-28 (×18): qty 1

## 2013-02-28 NOTE — Progress Notes (Signed)
Patient ID: Todd [name], male   DOB: 04-Jan-1976, 37 y.o.   MRN: [id number] He has been up and to groups interacting with peers and staff. Another pt confronted him and he argued back. He was left dayroom on request of MHT and he calmed down. He has refused to fill out his Self Inventory. He  Denies thoughts of harming self or others.

## 2013-02-28 NOTE — Progress Notes (Signed)
[facility] MD Progress Note  02/28/2013 3:19 PM Todd [name]  MRN:  [id number] Subjective:  Todd [name] is having a "rough time." admits he is feeling irritable, he has had some close calls with another patient and almost loses control. States that if he was out of here he would have been in trouble already. He is still going through some withdrawal, aches, pains, cravings. He is worried that he might relapse. His Hep C screening was positive. (broke down when he heard the result of the test) Expresses a lot of shame, guilt, regrets for what he has done to himself.  Diagnosis:   DSM5: Schizophrenia Disorders:   Obsessive-Compulsive Disorders:   Trauma-Stressor Disorders:  Posttraumatic Stress Disorder (309.81) Substance/Addictive Disorders:  Alcohol Related Disorder - Severe (303.90) and Opioid Disorder - Severe (304.00) Depressive Disorders:  Major Depressive Disorder - Moderate (296.22)  Axis I: Mood Disorder NOS  ADL's:  Intact  Sleep: Fair  Appetite:  Fair  Suicidal Ideation:  Plan:  denies Intent:  denies Means:  denies Homicidal Ideation:  Plan:  denies Intent:  denies Means:  denies AEB (as evidenced by):  Psychiatric Specialty Exam: Review of Systems  Constitutional: Negative.   HENT: Negative.   Eyes: Negative.   Respiratory: Negative.   Cardiovascular: Negative.   Gastrointestinal: Negative.   Genitourinary: Negative.   Musculoskeletal: Negative.   Skin: Negative.   Neurological: Negative.   Endo/Heme/Allergies: Negative.   Psychiatric/Behavioral: Positive for depression and substance abuse. The patient is nervous/anxious and has insomnia.     Blood pressure 105/71, pulse 65, temperature 96.7 F (35.9 C), temperature source Oral, resp. rate 18, height 6' (1.829 m), weight 89.359 kg (197 lb).Body mass index is 26.71 kg/(m^2).  General Appearance: Fairly Groomed  Patent attorney::  Fair  Speech:  Clear and Coherent  Volume:  Decreased  Mood:  Anxious, Depressed,  Dysphoric, Irritable and Worthless  Affect:  Restricted and Tearful  Thought Process:  Coherent and Goal Directed  Orientation:  Full (Time, Place, and Person)  Thought Content:  worries, concerns, regrets, fears  Suicidal Thoughts:  intermittent  Homicidal Thoughts:  No  Memory:  Immediate;   Fair Recent;   Fair Remote;   Fair  Judgement:  Fair  Insight:  Present  Psychomotor Activity:  Restlessness  Concentration:  Fair  Recall:  Fair  Akathisia:  No  Handed:    AIMS (if indicated):     Assets:  Desire for Improvement  Sleep:  Number of Hours: 6.75   Current Medications: Current Facility-Administered Medications  Medication Dose Route Frequency Provider Last Rate Last Dose  . alum & mag hydroxide-simeth (MAALOX/MYLANTA) 200-200-20 MG/5ML suspension 30 mL  30 mL Oral Q4H PRN Nanine Means, NP      . chlordiazePOXIDE (LIBRIUM) capsule 25 mg  25 mg Oral Q6H PRN Nanine Means, NP      . chlordiazePOXIDE (LIBRIUM) capsule 25 mg  25 mg Oral Once Nanine Means, NP      . chlordiazePOXIDE (LIBRIUM) capsule 25 mg  25 mg Oral BH-qamhs Nanine Means, NP   25 mg at 02/28/13 0840   Followed by  . [START ON 03/01/2013] chlordiazePOXIDE (LIBRIUM) capsule 25 mg  25 mg Oral Daily Nanine Means, NP      . cloNIDine (CATAPRES) tablet 0.1 mg  0.1 mg Oral BH-qamhs Nanine Means, NP   0.1 mg at 02/28/13 0839   Followed by  . [START ON 03/02/2013] cloNIDine (CATAPRES) tablet 0.1 mg  0.1 mg Oral QAC breakfast Nanine Means, NP      .  dicyclomine (BENTYL) tablet 20 mg  20 mg Oral Q6H PRN Nanine Means, NP      . gabapentin (NEURONTIN) capsule 300 mg  300 mg Oral TID Rachael Fee, MD      . magnesium hydroxide (MILK OF MAGNESIA) suspension 30 mL  30 mL Oral Daily PRN Nanine Means, NP   30 mL at 02/28/13 1154  . methocarbamol (ROBAXIN) tablet 500 mg  500 mg Oral Q8H PRN Nanine Means, NP   500 mg at 02/27/13 1610  . multivitamin with minerals tablet 1 tablet  1 tablet Oral Daily Nanine Means, NP   1 tablet at  02/28/13 0840  . naproxen (NAPROSYN) tablet 500 mg  500 mg Oral BID PRN Nanine Means, NP   500 mg at 02/27/13 0917  . nicotine (NICODERM CQ - dosed in mg/24 hours) patch 21 mg  21 mg Transdermal Daily Nanine Means, NP   21 mg at 02/28/13 1441  . QUEtiapine (SEROQUEL) tablet 50 mg  50 mg Oral Q6H PRN Rachael Fee, MD   50 mg at 02/27/13 2213  . QUEtiapine (SEROQUEL) tablet 50 mg  50 mg Oral TID Rachael Fee, MD   50 mg at 02/28/13 1154  . thiamine (B-1) injection 100 mg  100 mg Intramuscular Once Nanine Means, NP      . thiamine (VITAMIN B-1) tablet 100 mg  100 mg Oral Daily Nanine Means, NP   100 mg at 02/28/13 0840  . traZODone (DESYREL) tablet 200 mg  200 mg Oral QHS Rachael Fee, MD   200 mg at 02/27/13 2209    Lab Results:  Results for orders placed during the hospital encounter of 02/25/13 (from the past 48 hour(s))  HEPATITIS PANEL, ACUTE     Status: Abnormal   Collection Time    02/27/13  7:47 PM      Result Value Range   Hepatitis B Surface Ag NEGATIVE  NEGATIVE   HCV Ab Reactive (*) NEGATIVE   Comment: (NOTE)                                                                               This test is for screening purposes only.  Reactive results should be     confirmed by an alternative method.  Suggest HCV Qualitative, PCR,     test code 96045.  Specimens will be stable for reflex testing up to 3     days after collection.   Hep A IgM NON REACTIVE  NON REACTIVE   Hep B C IgM NON REACTIVE  NON REACTIVE   Comment: (NOTE)     High levels of Hepatitis B Core IgM antibody are detectable     during the acute stage of Hepatitis B. This antibody is used     to differentiate current from past HBV infection.     Performed at [facility]    Physical Findings: AIMS: Facial and Oral Movements Muscles of Facial Expression: None, normal Lips and Perioral Area: None, normal Jaw: None, normal Tongue: None, normal,Extremity Movements Upper (arms, wrists, hands, fingers):  None, normal Lower (legs, knees, ankles, toes): None, normal, Trunk Movements Neck, shoulders, hips: None, normal, Overall Severity  Severity of abnormal movements (highest score from questions above): None, normal Incapacitation due to abnormal movements: None, normal Patient's awareness of abnormal movements (rate only patient's report): No Awareness, Dental Status Current problems with teeth and/or dentures?: No Does patient usually wear dentures?: No  CIWA:  CIWA-Ar Total: 1 COWS:  COWS Total Score: 1  Treatment Plan Summary: Daily contact with patient to assess and evaluate symptoms and progress in treatment Medication management  Plan: Supportive approach/coping skills/relapse prevention           CBT/mindfulness           Wil continue detox protocol           Will increase the Neurontin to 300 mg TID           Will order the test suggested for confirmation of Hep C Medical Decision Making Problem Points:  Review of psycho-social stressors (1) Data Points:  Review of medication regiment & side effects (2)  I certify that inpatient services furnished can reasonably be expected to improve the patient's condition.   Karee Christopherson A 02/28/2013, 3:19 PM

## 2013-02-28 NOTE — Progress Notes (Signed)
Adult Psychoeducational Group Note  Date:  02/28/2013 Time:  10:07 AM  Group Topic/Focus:  Orientation:   The focus of this group is to educate the patient on the purpose and policies of crisis stabilization and provide a format to answer questions about their admission.  The group details unit policies and expectations of patients while admitted.  Participation Level:  Active  Participation Quality:  Appropriate, Sharing and Supportive  Affect:  Appropriate  Cognitive:  Appropriate  Insight: Appropriate  Engagement in Group:  Engaged and Supportive  Modes of Intervention:  Orientation  Additional Comments:  Pt participated in the morning exercises. Pt stated happiness is treating others the way you want to be treated and to have positive thinking. Pt stated his goal for today was to stay awake and that he wanted to go straight to [facility] from the hospital.   Caswell Corwin 02/28/2013, 10:07 AM

## 2013-02-28 NOTE — [facility] Group Notes (Signed)
[facility] LCSW Group Therapy  02/28/2013  1:15 PM   Type of Therapy:  Group Therapy  Participation Level:  Active  Participation Quality:  Appropriate and Attentive  Affect:  Appropriate  Cognitive:  Alert and Appropriate  Insight:  Developing/Improving and Engaged  Engagement in Therapy:  Developing/Improving and Engaged  Modes of Intervention:  Clarification, Confrontation, Discussion, Education, Exploration, Limit-setting, Orientation, Problem-solving, Rapport Building, Dance movement psychotherapist, Socialization and Support  Summary of Progress/Problems: The topic for group was balance in life.  Today's group focused on defining balance in one's own words, identifying things that can knock one off balance, and exploring healthy ways to maintain balance in life. Group members were asked to provide an example of a time when they felt off balance, describe how they handled that situation,and process healthier ways to regain balance in the future. Group members were asked to share the most important tool for maintaining balance that they learned while at [facility] and how they plan to apply this method after discharge.  Pt was able to address a conflict the group was having prior to this group starting, discussing his emotions behind how he felt when it occurred.  Through group leader's CBT, pt was able to identify feeling less than and small when other peers were making fun of him having hepatitis C and his natural response being to fight.  Pt was able to process how this comes from his childhood of being biracial and not fitting in, having to stuck up for himself by fighting.  Pt was able to resolve the conflict with the peer and apologize for his actions in the situation.  Pt was open to talking to a therapist longer term to learn ways to get grounded and think before acting as he did in this situation.  Pt actively participated and was engaged in group discussion.    Reyes Ivan, LCSWA 02/28/2013 2:44 PM

## 2013-02-28 NOTE — Progress Notes (Signed)
Recreation Therapy Notes  Date: 10.16.2014 Time: 3:00pm Location: [address] Dayroom   Group Topic: Communication, Team Building, Problem Solving  Goal Area(s) Addresses:  Patient will effectively work with peer towards shared goal.  Patient will identify skill used to make activity successful.  Patient will identify how skills used during activity can be used to reach post d/c goals.   Behavioral Response: Engaged, Attentive, Appropriate   Intervention: Problem Solving Activity  Activity: Landing Pad. In teams patients were given 12 plastic drinking straws and a length of masking tape. Using the materials provided patients were asked to build a landing pad to catch a golf ball dropped from approximately 6 feet in the air.   Education: Discharge Planning, Positive Lifestyle Change   Education Outcome: Acknowledges understanding  Clinical Observations/Feedback: Patient actively engaged in activity working well with team mates, offering suggestions for building teams landing pad, as well as assisting with Holiday representative. Patient contributed to group discussion, identifying communication and problem solving as skill needed to make activity successful. Patient related having good communication skills to being able to maintain employment. When asked to apply it to his recovery process and the positive lifestyle changes needed to maintain recovery, patient related communication skills to working with his sponsor and support system.   Marykay Lex Sterling Ucci, LRT/CTRS  Isaul Landi L 02/28/2013 5:04 PM

## 2013-02-28 NOTE — Progress Notes (Signed)
Patient ID: Todd [name], male   DOB: 1976-01-24, 37 y.o.   MRN: [id number] He has been up and to groups interacting with peers and staff He denies thoughts to harm self, no c/o discomfort. He has been sleepy today because he said he stayed up talking to 3 am last night to roommate. He is concerned about being discharged prior to his going to a treatment center. He says he has no support system here and said he will relapse.

## 2013-02-28 NOTE — Progress Notes (Signed)
[facility] Group Notes:  (Nursing/MHT/Case Management/Adjunct)  Date:  02/28/2013  Time:  10:22 AM  Type of Therapy:  AA Meeting   Summary of Progress/Problems: Pt attended the AA speaker group.  Elaina Cara C 02/28/2013, 10:22 AM 

## 2013-02-28 NOTE — Progress Notes (Signed)
Patient ID: Todd [name], male   DOB: October 17, 1975, 37 y.o.   MRN: [id number] D: Patient in room on approach. Pt stated he "feels anxious". Pt mood/affect appeared anxious. Pt denies any other withdrawal symptoms. Pt denies SI/HI/AVH and pain. Pt attended evening NA group and engaged in discussion. Pt denies any needs or concerns.  Cooperative with assessment. No acute distressed noted at this time.   A: Met with pt 1:1. Medications administered as prescribed. Writer encouraged pt to discuss feelings. Pt encouraged to come to staff with any question or concerns.   R: Patient remains safe. He is complaint with medications and denies any adverse reaction. Continue current POC.

## 2013-03-01 MED ORDER — GABAPENTIN 300 MG PO CAPS
300.0000 mg | ORAL_CAPSULE | Freq: Every day | ORAL | Status: DC
  Administered 2013-03-01 – 2013-03-04 (×4): 300 mg via ORAL
  Filled 2013-03-01 (×5): qty 1

## 2013-03-01 MED ORDER — QUETIAPINE FUMARATE 100 MG PO TABS
100.0000 mg | ORAL_TABLET | Freq: Every day | ORAL | Status: DC
  Administered 2013-03-01 – 2013-03-04 (×4): 100 mg via ORAL
  Filled 2013-03-01 (×5): qty 1

## 2013-03-01 MED ORDER — QUETIAPINE FUMARATE 50 MG PO TABS
75.0000 mg | ORAL_TABLET | Freq: Three times a day (TID) | ORAL | Status: DC
  Administered 2013-03-01 – 2013-03-03 (×6): 75 mg via ORAL
  Filled 2013-03-01 (×11): qty 1

## 2013-03-01 NOTE — Tx Team (Signed)
Interdisciplinary Treatment Plan Update (Adult)  Date: 03/01/2013   Time Reviewed: 11:08 AM  Progress in Treatment:  Attending groups: Yes Participating in groups:  Yes  Taking medication as prescribed: Yes  Tolerating medication: Yes  Family/Significant othe contact made: No. SPE not required for this pt.  Patient understands diagnosis: Yes, AEB seeking treatment for ETOH detox and depression.  Discussing patient identified problems/goals with staff: Yes  Medical problems stabilized or resolved: Yes  Denies suicidal/homicidal ideation: Yes during admission, group, and self report.  Patient has not harmed self or Others: Yes  New problem(s) identified: n/a  Discharge Plan or Barriers: Pt has admission date for [facility] on Tues. 10/21 and plans to follow up at [facility] for med management. Pt reports that he will not make it if d/ced prior to [facility] date. CSW gave pt list of shelters/[place] [facility] in order to prepare for possible d/c prior to Tuesday.  Additional comments: Pt reporting mild withdrawals with mood lability.  Reason for Continuation of Hospitalization: Clonidine taper-withdrawals Mood stabilization Medication management  Estimated length of stay:2-3 days (likely d/c Monday or Tues AM)  For review of initial/current patient goals, please see plan of care.  Attendees:  Patient:    Family:    Physician: Geoffery Lyons MD 03/01/2013 11:08 AM   Nursing: Sue Lush RN  03/01/2013 11:08 AM   Clinical Social Worker Garnet Chatmon Smart, LCSWA  03/01/2013 11:08 AM   Other: Massie Kluver, Care Coordination  03/01/2013 11:08 AM   Other:    Other: Darden Dates Nurse CM  03/01/2013 11:08 AM   Other:    Scribe for Treatment Team:  The Sherwin-Munar LCSWA 03/01/2013 11:08 AM

## 2013-03-01 NOTE — Progress Notes (Signed)
D.  Pt pleasant on approach, denies complaints other than some anxiety.  Positive for evening AA group, interacting appropriately within milieu.  Denies SI/HI/hallucinations at this time.  A.  Support and encouragement offered  R.  Pt remains safe, will continue to monitor.

## 2013-03-01 NOTE — Progress Notes (Signed)
Pt resting in bed with eyes closed. RR WNL, even and unlabored. Level III obs in place and pt remains safe. Lawrence Marseilles

## 2013-03-01 NOTE — Progress Notes (Signed)
[facility] MD Progress Note  03/01/2013 2:45 PM Todd [name]  MRN:  [id number] Subjective:  Todd [name] continues to have a hard time. He is experiencing mood instability, episodes of irritability with racing thoughts. . He is having issues with his other peers due to his mood instability. He is not sleeping trough the night, has dreams of people following him. States that he was dependent on substances to deal with his symptoms and now he is off the substances so he is having a hard time dealing with them.  Diagnosis:   DSM5: Schizophrenia Disorders:   Obsessive-Compulsive Disorders:   Trauma-Stressor Disorders:  Posttraumatic Stress Disorder (309.81) Substance/Addictive Disorders:  Alcohol Related Disorder - Severe (303.90) and Opioid Disorder - Severe (304.00) Depressive Disorders:    Axis I: Mood Disorder NOS  ADL's:  Intact  Sleep: Poor  Appetite:  Fair  Suicidal Ideation:  Plan:  denies Intent:  denies Means:  denies Homicidal Ideation:  Plan:  denies Intent:  denies Means:  denies AEB (as evidenced by):  Psychiatric Specialty Exam: Review of Systems  Constitutional: Negative.   HENT: Negative.   Eyes: Negative.   Respiratory: Negative.   Cardiovascular: Negative.   Gastrointestinal: Negative.   Genitourinary: Negative.   Musculoskeletal: Negative.   Skin: Negative.   Neurological: Negative.   Endo/Heme/Allergies: Negative.   Psychiatric/Behavioral: Positive for depression and substance abuse. The patient is nervous/anxious and has insomnia.     Blood pressure 147/85, pulse 77, temperature 97.4 F (36.3 C), temperature source Oral, resp. rate 18, height 6' (1.829 m), weight 89.359 kg (197 lb).Body mass index is 26.71 kg/(m^2).  General Appearance: Fairly Groomed  Patent attorney::  Fair  Speech:  Clear and Coherent  Volume:  fluctuates  Mood:  Anxious, Depressed, Dysphoric and Irritable  Affect:  Restricted  Thought Process:  Coherent and Goal Directed  Orientation:   Full (Time, Place, and Person)  Thought Content:  symptoms. worries, concerns  Suicidal Thoughts:  Yes.  without intent/plan  Homicidal Thoughts:  No  Memory:  Immediate;   Fair Recent;   Fair Remote;   Fair  Judgement:  Fair  Insight:  Present and superfical  Psychomotor Activity:  Restlessness  Concentration:  Fair  Recall:  Fair  Akathisia:  No  Handed:    AIMS (if indicated):     Assets:  Desire for Improvement  Sleep:  Number of Hours: 4.75   Current Medications: Current Facility-Administered Medications  Medication Dose Route Frequency Provider Last Rate Last Dose  . alum & mag hydroxide-simeth (MAALOX/MYLANTA) 200-200-20 MG/5ML suspension 30 mL  30 mL Oral Q4H PRN Nanine Means, NP      . chlordiazePOXIDE (LIBRIUM) capsule 25 mg  25 mg Oral Once Nanine Means, NP      . cloNIDine (CATAPRES) tablet 0.1 mg  0.1 mg Oral BH-qamhs Nanine Means, NP   0.1 mg at 03/01/13 0830   Followed by  . [START ON 03/02/2013] cloNIDine (CATAPRES) tablet 0.1 mg  0.1 mg Oral QAC breakfast Nanine Means, NP      . dicyclomine (BENTYL) tablet 20 mg  20 mg Oral Q6H PRN Nanine Means, NP      . gabapentin (NEURONTIN) capsule 300 mg  300 mg Oral TID Rachael Fee, MD   300 mg at 03/01/13 1206  . gabapentin (NEURONTIN) capsule 300 mg  300 mg Oral QHS Rachael Fee, MD      . magnesium hydroxide (MILK OF MAGNESIA) suspension 30 mL  30 mL Oral Daily PRN Catha Nottingham  Lord, NP   30 mL at 02/28/13 1154  . methocarbamol (ROBAXIN) tablet 500 mg  500 mg Oral Q8H PRN Nanine Means, NP   500 mg at 02/27/13 1610  . multivitamin with minerals tablet 1 tablet  1 tablet Oral Daily Nanine Means, NP   1 tablet at 03/01/13 9604  . naproxen (NAPROSYN) tablet 500 mg  500 mg Oral BID PRN Nanine Means, NP   500 mg at 02/27/13 0917  . nicotine (NICODERM CQ - dosed in mg/24 hours) patch 21 mg  21 mg Transdermal Daily Nanine Means, NP   21 mg at 03/01/13 0830  . QUEtiapine (SEROQUEL) tablet 100 mg  100 mg Oral QHS Rachael Fee, MD       . QUEtiapine (SEROQUEL) tablet 50 mg  50 mg Oral Q6H PRN Rachael Fee, MD   50 mg at 02/27/13 2213  . QUEtiapine (SEROQUEL) tablet 75 mg  75 mg Oral TID Rachael Fee, MD      . thiamine (B-1) injection 100 mg  100 mg Intramuscular Once Nanine Means, NP      . thiamine (VITAMIN B-1) tablet 100 mg  100 mg Oral Daily Nanine Means, NP   100 mg at 03/01/13 0829  . traZODone (DESYREL) tablet 200 mg  200 mg Oral QHS Rachael Fee, MD   200 mg at 02/28/13 2222    Lab Results:  Results for orders placed during the hospital encounter of 02/25/13 (from the past 48 hour(s))  HEPATITIS PANEL, ACUTE     Status: Abnormal   Collection Time    02/27/13  7:47 PM      Result Value Range   Hepatitis B Surface Ag NEGATIVE  NEGATIVE   HCV Ab Reactive (*) NEGATIVE   Comment: (NOTE)                                                                               This test is for screening purposes only.  Reactive results should be     confirmed by an alternative method.  Suggest HCV Qualitative, PCR,     test code 54098.  Specimens will be stable for reflex testing up to 3     days after collection.   Hep A IgM NON REACTIVE  NON REACTIVE   Hep B C IgM NON REACTIVE  NON REACTIVE   Comment: (NOTE)     High levels of Hepatitis B Core IgM antibody are detectable     during the acute stage of Hepatitis B. This antibody is used     to differentiate current from past HBV infection.     Performed at [facility]    Physical Findings: AIMS: Facial and Oral Movements Muscles of Facial Expression: None, normal Lips and Perioral Area: None, normal Jaw: None, normal Tongue: None, normal,Extremity Movements Upper (arms, wrists, hands, fingers): None, normal Lower (legs, knees, ankles, toes): None, normal, Trunk Movements Neck, shoulders, hips: None, normal, Overall Severity Severity of abnormal movements (highest score from questions above): None, normal Incapacitation due to abnormal movements: None,  normal Patient's awareness of abnormal movements (rate only patient's report): No Awareness, Dental Status Current problems with teeth and/or dentures?: No  Does patient usually wear dentures?: No  CIWA:  CIWA-Ar Total: 2 COWS:  COWS Total Score: 1  Treatment Plan Summary: Daily contact with patient to assess and evaluate symptoms and progress in treatment Medication management  Plan: Supportive approach/coping skills/relapse prevention           Will increase the Seroquel to 75 mg TID, 100 mg HS                                          Neurontin 300 mg TID and HS           CBT/mindfulness  Medical Decision Making Problem Points:  Review of psycho-social stressors (1) Data Points:  Review of medication regiment & side effects (2) Review of new medications or change in dosage (2)  I certify that inpatient services furnished can reasonably be expected to improve the patient's condition.   Zitlaly Malson A 03/01/2013, 2:45 PM
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
## 2013-03-01 NOTE — Progress Notes (Signed)
Patient did attend the evening karaoke group. Pt was engaged, supportive, and participated by singing multiple songs.   

## 2013-03-01 NOTE — [facility] Group Notes (Signed)
[facility] Group Notes:  (Nursing/MHT/Case Management/Adjunct)  Date:  03/01/2013  Time:  3:01 PM  Type of Therapy:  Psychoeducational Skills  Participation Level:  Active  Participation Quality:  Appropriate  Affect:  Appropriate  Cognitive:  Appropriate  Insight:  Appropriate  Engagement in Group:  Engaged  Modes of Intervention:  Discussion, Exploration, Rapport Building, Socialization and Support  Summary of Progress/Problems: Pt attended group this morning.Pt participated and shared his strength of being "vigilant." Pt stated, "I have to be vigilant to recognize danger during my recovery. I have to know or I'll be subject to relapse."   Tania Ade 03/01/2013, 3:01 PM
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
## 2013-03-01 NOTE — Progress Notes (Signed)
Adult Psychoeducational Group Note  Date:  03/01/2013 Time:  2:28 PM  Group Topic/Focus:  Recovery Goals:   The focus of this group is to identify appropriate goals for recovery and establish a plan to achieve them. Relapse Prevention Planning:   The focus of this group is to define relapse and discuss the need for planning to combat relapse.  Participation Level:  Minimal  Participation Quality:  Drowsy  Affect:  drowsy  Cognitive:  Lacking  Insight: Lacking  Engagement in Group:  Lacking  Modes of Intervention:  Discussion, Education, Socialization and Support  Additional Comments:  Pt was sleeping when group began but towards the end he awoke and added his comments about the discussion regarding the Journey to recovery.  Reynolds Bowl 03/01/2013, 2:28 PM

## 2013-03-02 NOTE — Progress Notes (Signed)
D.  Pt. Pleasant and cooperative.  Denies SI/HI and denies A/V hallucinations. Reports "I haven't felt this good in a year." No concerns or issues voiced at present. A.  Pt.  Encouraged to notify writer or staff of any concerns or issues. R.  Pt. Receptive and remains safe.

## 2013-03-02 NOTE — [facility] Group Notes (Signed)
[facility] Group Notes:  (Clinical Social Work)  03/02/2013     10-11AM  Summary of Progress/Problems:   The main focus of today's process group was for the patient to identify ways in which they have in the past sabotaged their own recovery. Motivational Interviewing was utilized to ask the group members what they get out of their substance use, and what reasons they may have for wanting to change.  The Stages of Change were explained using a handout, and patients identified where they currently are with regard to stages of change.  The patient expressed that his desire to change his addiction is 8 out of 10.  He said his whole family uses, and are dug users.  He said that he has always been the "bad guy" in the family, but now wants to be the good one.  He said what he lies most about the sober ife is the camaraderie.,  Type of Therapy:  Group Therapy - Process   Participation Level:  Active  Participation Quality:  Attentive, Sharing and Supportive  Affect:  Appropriate  Cognitive:  Appropriate  Insight:  Developing/Improving  Engagement in Therapy:  Engaged  Modes of Intervention:  Education, Support and Processing, Motivational Interviewing  Ambrose Mantle, LCSW 03/02/2013, 12:53 PM

## 2013-03-02 NOTE — Progress Notes (Signed)
[facility] MD Progress Note  03/02/2013 4:46 PM Todd [name]  MRN:  [id number] Subjective:  Still with occasional negative thoughts, but not as often and easier to handle. He is sleeping better what has made things more tolerable. Does not want to feel like a zombie so he states he thinks the medication dosage is enough to allow him to control his thoughts, his mood but not make him too sedated. Continues to work on identifying triggers and making plans of how to address them Diagnosis:   DSM5: Schizophrenia Disorders:   Obsessive-Compulsive Disorders:   Trauma-Stressor Disorders:  Posttraumatic Stress Disorder (309.81) Substance/Addictive Disorders:  Alcohol Related Disorder - Severe (303.90) and Opioid Disorder - Severe (304.00) Depressive Disorders:  Major Depressive Disorder - Moderate (296.22)  Axis I: Anxiety Disorder NOS and Mood Disorder NOS  ADL's:  Intact  Sleep: Fair  Appetite:  Fair  Suicidal Ideation:  Plan:  denies Intent:  denies Means:  denies Homicidal Ideation:  Plan:  denies Intent:  denies Means:  denies AEB (as evidenced by):  Psychiatric Specialty Exam: Review of Systems  Constitutional: Negative.   HENT: Negative.   Eyes: Negative.   Respiratory: Negative.   Cardiovascular: Negative.   Gastrointestinal: Negative.   Genitourinary: Negative.   Musculoskeletal: Negative.   Skin: Negative.   Neurological: Negative.   Endo/Heme/Allergies: Negative.   Psychiatric/Behavioral: Positive for substance abuse. The patient is nervous/anxious.     Blood pressure 130/90, pulse 80, temperature 97.8 F (36.6 C), temperature source Oral, resp. rate 20, height 6' (1.829 m), weight 89.359 kg (197 lb).Body mass index is 26.71 kg/(m^2).  General Appearance: Fairly Groomed  Patent attorney::  Fair  Speech:  Clear and Coherent and not spontaneus  Volume:  Decreased  Mood:  Anxious and worried  Affect:  Restricted  Thought Process:  Coherent and Goal Directed   Orientation:  Full (Time, Place, and Person)  Thought Content:  symptoms, worries, concerns  Suicidal Thoughts:  No  Homicidal Thoughts:  No  Memory:  Immediate;   Fair Recent;   Fair Remote;   Fair  Judgement:  Fair  Insight:  Present  Psychomotor Activity:  Restlessness  Concentration:  Fair  Recall:  Fair  Akathisia:  No  Handed:    AIMS (if indicated):     Assets:  Desire for Improvement  Sleep:  Number of Hours: 4.75   Current Medications: Current Facility-Administered Medications  Medication Dose Route Frequency Provider Last Rate Last Dose  . alum & mag hydroxide-simeth (MAALOX/MYLANTA) 200-200-20 MG/5ML suspension 30 mL  30 mL Oral Q4H PRN Nanine Means, NP      . chlordiazePOXIDE (LIBRIUM) capsule 25 mg  25 mg Oral Once Nanine Means, NP      . cloNIDine (CATAPRES) tablet 0.1 mg  0.1 mg Oral QAC breakfast Nanine Means, NP   0.1 mg at 03/02/13 0740  . dicyclomine (BENTYL) tablet 20 mg  20 mg Oral Q6H PRN Nanine Means, NP      . gabapentin (NEURONTIN) capsule 300 mg  300 mg Oral TID Rachael Fee, MD   300 mg at 03/02/13 1138  . gabapentin (NEURONTIN) capsule 300 mg  300 mg Oral QHS Rachael Fee, MD   300 mg at 03/01/13 2149  . magnesium hydroxide (MILK OF MAGNESIA) suspension 30 mL  30 mL Oral Daily PRN Nanine Means, NP   30 mL at 02/28/13 1154  . methocarbamol (ROBAXIN) tablet 500 mg  500 mg Oral Q8H PRN Nanine Means, NP   500  mg at 03/02/13 0742  . multivitamin with minerals tablet 1 tablet  1 tablet Oral Daily Nanine Means, NP   1 tablet at 03/02/13 0739  . naproxen (NAPROSYN) tablet 500 mg  500 mg Oral BID PRN Nanine Means, NP   500 mg at 02/27/13 0917  . nicotine (NICODERM CQ - dosed in mg/24 hours) patch 21 mg  21 mg Transdermal Daily Nanine Means, NP   21 mg at 03/02/13 0740  . QUEtiapine (SEROQUEL) tablet 100 mg  100 mg Oral QHS Rachael Fee, MD   100 mg at 03/01/13 2149  . QUEtiapine (SEROQUEL) tablet 50 mg  50 mg Oral Q6H PRN Rachael Fee, MD   50 mg at 03/02/13  1610  . QUEtiapine (SEROQUEL) tablet 75 mg  75 mg Oral TID Rachael Fee, MD   75 mg at 03/02/13 1138  . thiamine (B-1) injection 100 mg  100 mg Intramuscular Once Nanine Means, NP      . thiamine (VITAMIN B-1) tablet 100 mg  100 mg Oral Daily Nanine Means, NP   100 mg at 03/02/13 0739  . traZODone (DESYREL) tablet 200 mg  200 mg Oral QHS Rachael Fee, MD   200 mg at 03/01/13 2149    Lab Results:  Results for orders placed during the hospital encounter of 02/25/13 (from the past 48 hour(s))  HCV RNA QUANT     Status: Abnormal   Collection Time    02/28/13  8:17 PM      Result Value Range   HCV Quantitative Not Detected (*) <15 IU/mL   Comment: (NOTE)     No detectable level of HCV RNA.   HCV Quantitative Log NOT CALC  <1.18 log 10   Comment: (NOTE)     This test utilizes the [place] FDA approved Roche HCV Test Kit by RT-PCR.     Performed at [facility]    Physical Findings: AIMS: Facial and Oral Movements Muscles of Facial Expression: None, normal Lips and Perioral Area: None, normal Jaw: None, normal Tongue: None, normal,Extremity Movements Upper (arms, wrists, hands, fingers): None, normal Lower (legs, knees, ankles, toes): None, normal, Trunk Movements Neck, shoulders, hips: None, normal, Overall Severity Severity of abnormal movements (highest score from questions above): None, normal Incapacitation due to abnormal movements: None, normal Patient's awareness of abnormal movements (rate only patient's report): No Awareness, Dental Status Current problems with teeth and/or dentures?: No Does patient usually wear dentures?: No  CIWA:  CIWA-Ar Total: 2 COWS:  COWS Total Score: 1  Treatment Plan Summary: Daily contact with patient to assess and evaluate symptoms and progress in treatment Medication management  Plan: Supportive approach/coping skills/relapse prevention           Continue to optimize dosages of psychotropics            CBT  Medical Decision  Making Problem Points:  Review of psycho-social stressors (1) Data Points:  Review of medication regiment & side effects (2)  I certify that inpatient services furnished can reasonably be expected to improve the patient's condition.   Kamyra Schroeck A 03/02/2013, 4:46 PM

## 2013-03-02 NOTE — Progress Notes (Signed)
Adult Psychoeducational Group Note  Date:  03/02/2013 Time:  3:29 PM  Group Topic/Focus:  Healthy Communication:   The focus of this group is to discuss communication, barriers to communication, as well as healthy ways to communicate with others.  Participation Level:  None  Participation Quality:  Drowsy  Affect:  Lethargic  Cognitive:  Appropriate  Insight: None  Engagement in Group:  Poor  Modes of Intervention:  Discussion and Education  Additional Comments:  Pt closed eyes to sleep right when group started. Pt was asked to go lay down. Pt left group early to sleep.   Tora Perches N 03/02/2013, 3:29 PM

## 2013-03-02 NOTE — Progress Notes (Signed)
Patient ID: Todd [name], male   DOB: January 26, 1976, 37 y.o.   MRN: [id number]  D: Pt has been appropriate on the unit, he reported that he feels the best that he has ever felt in his life. Pt reported that his current medication regimen is great, and that his moods are stable and he has been able to get sleep. Pt reported that his depression and his hopelessness was a 2. Pt reported that he was negative SI/HI, no AH/VH noted. A: 15 min checks continued for pt safety. R: Pt safety maintained.

## 2013-03-02 NOTE — [facility] Group Notes (Signed)
[facility] Group Notes:  (Nursing/MHT/Case Management/Adjunct)  Date:  03/02/2013  Time:  1:33 PM  Type of Therapy:  Psychoeducational Skills  Participation Level:  Active  Participation Quality:  Appropriate  Affect:  Appropriate  Cognitive:  Appropriate  Insight:  Appropriate  Engagement in Group:  Engaged  Modes of Intervention:  Problem-solving  Summary of Progress/Problems: Pt attended healthy coping skills group and self inventory group.  Jacquelyne Balint Shanta 03/02/2013, 1:33 PM

## 2013-03-02 NOTE — Clinical Social Work Note (Signed)
Clinical Social Work Note  CSW provided patient with a pair of pants, a shirt, and a hoodie at his request.  He stated he had one shirt already, and nobody would be bringing him additional clothing.  Ambrose Mantle, LCSW 03/02/2013, 5:20 PM

## 2013-03-03 MED ORDER — QUETIAPINE FUMARATE 100 MG PO TABS
100.0000 mg | ORAL_TABLET | Freq: Three times a day (TID) | ORAL | Status: DC
  Administered 2013-03-03 – 2013-03-04 (×3): 100 mg via ORAL
  Filled 2013-03-03 (×10): qty 1

## 2013-03-03 MED ORDER — PAROXETINE HCL 10 MG PO TABS
10.0000 mg | ORAL_TABLET | Freq: Every day | ORAL | Status: DC
  Administered 2013-03-03 – 2013-03-04 (×2): 10 mg via ORAL
  Filled 2013-03-03: qty 1
  Filled 2013-03-03: qty 14
  Filled 2013-03-03 (×5): qty 1

## 2013-03-03 NOTE — Progress Notes (Signed)
Patient did attend the evening speaker AA meeting.  

## 2013-03-03 NOTE — Progress Notes (Signed)
D.  Pt pleasant but anxious on approach.  Denies other complaints at this time.  Happy with medication adjustment so far and hopeful for better sleep tonight.  Denies SI/HI/hallucinations at this time.  Interacting appropriately within milieu.  A.  Support and encouragement offered.  Medication given as ordered for anxiety.  R.  Pt remains safe, positive for evening AA group, will continue to monitor.

## 2013-03-03 NOTE — Progress Notes (Signed)
Patient ID: Todd [name], male   DOB: 1975-06-19, 37 y.o.   MRN: [id number]  D: Pt has been appropriate on the unit, he did report that he was agitated and anxious. Pt reported that another patient upset him when he called another patient something offensive. Pt requested his prn Seroquel, he was given Seroquel twice. Pt reported being negative SI/HI, no AH/VH noted. A: 15 minute checks continued for patient safety. R: Pts safety maintained.

## 2013-03-03 NOTE — [facility] Group Notes (Signed)
[facility] Group Notes:  (Clinical Social Work)  03/03/2013  10:00-11:00AM  Summary of Progress/Problems:   The main focus of today's process group was to   identify the patient's current support system and decide on other supports that can be put in place.  The picture on workbook was used to discuss why additional supports are needed, and a hand-out was distributed with four definitions/levels of support, then used to talk about how patients have given and received all different kinds of support.  An emphasis was placed on using counselor, doctor, therapy groups, 12-step groups, and problem-specific support groups to expand supports.  The patient identified his "best" girlfriend and his cousins as supports.  He stated that even though his cousins sell dope, they want him off of it.  Type of Therapy:  Process Group with Motivational Interviewing  Participation Level:  Active  Participation Quality:  Attentive, Sharing and Supportive  Affect:  Blunted  Cognitive:  Alert, Appropriate and Oriented  Insight:  Engaged  Engagement in Therapy:  Engaged  Modes of Intervention:   Education, Support and Processing, Activity  [employer], LCSW 03/03/2013, 12:41 PM

## 2013-03-03 NOTE — Progress Notes (Signed)
The focus of this group is to educate the patient on the purpose and policies of crisis stabilization and provide a format to answer questions about their admission.  The group details unit policies and expectations of patients while admitted.  Patient attended 0900 nurse education orientation group.  Patient actively participated, appropriate affect, alert, appropriate insight and engagement  Today patient will work on 3 goals for discharge. 

## 2013-03-03 NOTE — Progress Notes (Signed)
[facility] MD Progress Note  03/03/2013 7:54 PM Todd [name]  MRN:  [id number] Subjective:  Keeven endorses that he is still having depression, mood instability, anxiety, racing thoughts. Has difficulty falling asleep due to the racing thoughts. Will like his medications adjusted. Stated the Seroquel has helped but he is still struggling. States he really wants this to work for him He is also having dreams were he is being persecuted. Sates that he used Paxil successfully i the past Diagnosis:   DSM5: Schizophrenia Disorders:   Obsessive-Compulsive Disorders:   Trauma-Stressor Disorders:  Posttraumatic Stress Disorder (309.81) Substance/Addictive Disorders:  Alcohol Related Disorder - Severe (303.90), Cannabis Use Disorder - Moderate 9304.30) and Opioid Disorder - Severe (304.00) Depressive Disorders:  Major Depressive Disorder - Moderate (296.22)  Axis I: Anxiety Disorder NOS and Mood Disorder NOS  ADL's:  Intact  Sleep: Poor  Appetite:  Fair  Suicidal Ideation:  Plan:  denies Intent:  denies Means:  denies Homicidal Ideation:  Plan:  denies Intent:  denies Means:  denies AEB (as evidenced by):  Psychiatric Specialty Exam: Review of Systems  Constitutional: Negative.   HENT: Negative.   Eyes: Negative.   Respiratory: Negative.   Cardiovascular: Negative.   Gastrointestinal: Negative.   Genitourinary: Negative.   Musculoskeletal: Negative.   Skin: Negative.   Neurological: Negative.   Endo/Heme/Allergies: Negative.   Psychiatric/Behavioral: Positive for depression and substance abuse. The patient is nervous/anxious and has insomnia.     Blood pressure 148/107, pulse 82, temperature 98.2 F (36.8 C), temperature source Oral, resp. rate 16, height 6' (1.829 m), weight 89.359 kg (197 lb).Body mass index is 26.71 kg/(m^2).  General Appearance: Fairly Groomed  Patent attorney::  Fair  Speech:  Clear and Coherent  Volume:  fluctuates  Mood:  Anxious, Depressed and Irritable   Affect:  Restricted  Thought Process:  Coherent and Goal Directed  Orientation:  Full (Time, Place, and Person)  Thought Content:  symptoms, worries, concerns  Suicidal Thoughts:  Intermittent (when hopeless)  Homicidal Thoughts:  No  Memory:  Immediate;   Fair Recent;   Fair Remote;   Fair  Judgement:  Fair  Insight:  Present  Psychomotor Activity:  Restlessness  Concentration:  Fair  Recall:  Fair  Akathisia:  No  Handed:    AIMS (if indicated):     Assets:  Desire for Improvement  Sleep:  Number of Hours: 5.25   Current Medications: Current Facility-Administered Medications  Medication Dose Route Frequency Provider Last Rate Last Dose  . alum & mag hydroxide-simeth (MAALOX/MYLANTA) 200-200-20 MG/5ML suspension 30 mL  30 mL Oral Q4H PRN Nanine Means, NP      . chlordiazePOXIDE (LIBRIUM) capsule 25 mg  25 mg Oral Once Nanine Means, NP      . gabapentin (NEURONTIN) capsule 300 mg  300 mg Oral TID Rachael Fee, MD   300 mg at 03/03/13 1709  . gabapentin (NEURONTIN) capsule 300 mg  300 mg Oral QHS Rachael Fee, MD   300 mg at 03/02/13 2204  . magnesium hydroxide (MILK OF MAGNESIA) suspension 30 mL  30 mL Oral Daily PRN Nanine Means, NP   30 mL at 02/28/13 1154  . multivitamin with minerals tablet 1 tablet  1 tablet Oral Daily Nanine Means, NP   1 tablet at 03/03/13 0754  . nicotine (NICODERM CQ - dosed in mg/24 hours) patch 21 mg  21 mg Transdermal Daily Nanine Means, NP   21 mg at 03/03/13 0754  . PARoxetine (PAXIL) tablet  10 mg  10 mg Oral Daily Rachael Fee, MD   10 mg at 03/03/13 1413  . QUEtiapine (SEROQUEL) tablet 100 mg  100 mg Oral QHS Rachael Fee, MD   100 mg at 03/02/13 2204  . QUEtiapine (SEROQUEL) tablet 100 mg  100 mg Oral TID Rachael Fee, MD   100 mg at 03/03/13 1709  . QUEtiapine (SEROQUEL) tablet 50 mg  50 mg Oral Q6H PRN Rachael Fee, MD   50 mg at 03/03/13 1942  . thiamine (B-1) injection 100 mg  100 mg Intramuscular Once Nanine Means, NP      . thiamine  (VITAMIN B-1) tablet 100 mg  100 mg Oral Daily Nanine Means, NP   100 mg at 03/03/13 0754  . traZODone (DESYREL) tablet 200 mg  200 mg Oral QHS Rachael Fee, MD   200 mg at 03/02/13 2203    Lab Results: No results found for this or any previous visit (from the past 48 hour(s)).  Physical Findings: AIMS: Facial and Oral Movements Muscles of Facial Expression: None, normal Lips and Perioral Area: None, normal Jaw: None, normal Tongue: None, normal,Extremity Movements Upper (arms, wrists, hands, fingers): None, normal Lower (legs, knees, ankles, toes): None, normal, Trunk Movements Neck, shoulders, hips: None, normal, Overall Severity Severity of abnormal movements (highest score from questions above): None, normal Incapacitation due to abnormal movements: None, normal Patient's awareness of abnormal movements (rate only patient's report): No Awareness, Dental Status Current problems with teeth and/or dentures?: No Does patient usually wear dentures?: No  CIWA:  CIWA-Ar Total: 2 COWS:  COWS Total Score: 1  Treatment Plan Summary: Daily contact with patient to assess and evaluate symptoms and progress in treatment Medication management  Plan: Supportive approach/coping skills/relapse prevention           CBT           Increase the Seroquel to 100 mg TID and HS           Add Paxil 10 mg dailly Medical Decision Making Problem Points:  Review of psycho-social stressors (1) Data Points:  Review of medication regiment & side effects (2) Review of new medications or change in dosage (2)  I certify that inpatient services furnished can reasonably be expected to improve the patient's condition.   Brannan Cassedy A 03/03/2013, 7:54 PM

## 2013-03-04 DIAGNOSIS — F332 Major depressive disorder, recurrent severe without psychotic features: Secondary | ICD-10-CM

## 2013-03-04 DIAGNOSIS — F909 Attention-deficit hyperactivity disorder, unspecified type: Secondary | ICD-10-CM

## 2013-03-04 DIAGNOSIS — F1010 Alcohol abuse, uncomplicated: Secondary | ICD-10-CM

## 2013-03-04 DIAGNOSIS — F4310 Post-traumatic stress disorder, unspecified: Secondary | ICD-10-CM

## 2013-03-04 MED ORDER — QUETIAPINE FUMARATE 200 MG PO TABS: 200.0000 mg | ORAL_TABLET | Freq: Three times a day (TID) | ORAL | Status: DC

## 2013-03-04 MED ORDER — QUETIAPINE FUMARATE 200 MG PO TABS
200.0000 mg | ORAL_TABLET | Freq: Three times a day (TID) | ORAL | Status: DC
  Administered 2013-03-04: 200 mg via ORAL
  Filled 2013-03-04 (×2): qty 1

## 2013-03-04 MED ORDER — IBUPROFEN 800 MG PO TABS
800.0000 mg | ORAL_TABLET | Freq: Four times a day (QID) | ORAL | Status: DC
  Administered 2013-03-04 (×3): 800 mg via ORAL
  Filled 2013-03-04 (×8): qty 1

## 2013-03-04 MED ORDER — PAROXETINE HCL 10 MG PO TABS: 10.0000 mg | ORAL_TABLET | Freq: Every day | ORAL | Status: DC

## 2013-03-04 MED ORDER — TRAZODONE HCL 100 MG PO TABS: 200.0000 mg | ORAL_TABLET | Freq: Every day | ORAL | Status: DC

## 2013-03-04 MED ORDER — QUETIAPINE FUMARATE 100 MG PO TABS: 100.0000 mg | ORAL_TABLET | Freq: Four times a day (QID) | ORAL | Status: DC

## 2013-03-04 MED ORDER — GABAPENTIN 300 MG PO CAPS: 300.0000 mg | ORAL_CAPSULE | Freq: Four times a day (QID) | ORAL | Status: DC

## 2013-03-04 MED ORDER — GABAPENTIN 600 MG PO TABS
300.0000 mg | ORAL_TABLET | Freq: Four times a day (QID) | ORAL | Status: DC
  Filled 2013-03-04 (×4): qty 28

## 2013-03-04 MED ORDER — QUETIAPINE FUMARATE 100 MG PO TABS
100.0000 mg | ORAL_TABLET | Freq: Four times a day (QID) | ORAL | Status: DC
  Filled 2013-03-04 (×4): qty 56

## 2013-03-04 MED ORDER — TRAZODONE HCL 100 MG PO TABS
200.0000 mg | ORAL_TABLET | Freq: Every day | ORAL | Status: DC
  Filled 2013-03-04: qty 28

## 2013-03-04 NOTE — [facility] Suicide Risk Assessment (Signed)
Suicide Risk Assessment  Discharge Assessment     Demographic Factors:  Male, Low socioeconomic status and Unemployed , african american  Mental Status Per Nursing Assessment::   On Admission:  NA  Current Mental Status by Physician: Patient alert and oriented to 4. Speech normal. Mood slightly improved, continues to have some irritability. Denies SI/HI/AH/VH. FaIR INSIGHT AND JUDGEMENT.  Loss Factors: Decrease in vocational status and Financial problems/change in socioeconomic status  Historical Factors: Family history of mental illness or substance abuse and Impulsivity  Risk Reduction Factors:   Positive coping skills or problem solving skills  Continued Clinical Symptoms:  Alcohol/Substance Abuse/Dependencies  Cognitive Features That Contribute To Risk:  Cognitively intact  Suicide Risk:  Minimal: No identifiable suicidal ideation.  Patients presenting with no risk factors but with morbid ruminations; may be classified as minimal risk based on the severity of the depressive symptoms  Discharge Diagnoses:   AXIS I:  Alcohol Abuse and Substance Induced Mood Disorder, Cannabis abuse, Opiate abuse AXIS II:  Deferred AXIS III:   Past Medical History  Diagnosis Date  . Hepatitis C   . Seizures     5 yrs ago with detox   AXIS IV:  housing problems, occupational problems and other psychosocial or environmental problems AXIS V:  61-70 mild symptoms  Plan Of Care/Follow-up recommendations:  Activity:  as tolerated Diet:  normal  Is patient on multiple antipsychotic therapies at discharge:  No   Has Patient had three or more failed trials of antipsychotic monotherapy by history:  No  Recommended Plan for Multiple Antipsychotic Therapies: NA  Matika Bartell 03/04/2013, 1:59 PM

## 2013-03-04 NOTE — Progress Notes (Signed)
[facility] Adult Case Management Discharge Plan :  Will you be returning to the same living situation after discharge: No. [facility] Tues AM. At discharge, do you have transportation home?:Yes,  bus pass in chart.  Do you have the ability to pay for your medications:Yes,  mental health.  Release of information consent forms completed and in the chart;  Patient's signature needed at discharge.  Patient to Follow up at: Follow-up Information   Follow up with [facility]  On 03/05/2013. (Arrive at [facility] for admission. Make sure to bring ID, med supply, and clothing. )    Contact information:   [address] [place], [place] [address] Phone: [phone] Fax: [phone]      Follow up with [facility] . (Walk in between 8AM-9AM Monday through Friday for hospital followup/medication management. )    [address]:   [address] [place], [place] [address] Phone: [phone] Fax: [phone]      Patient denies SI/HI:   Yes,  during admission, group, and self report.    Safety Planning and Suicide Prevention discussed:  Yes,  Yes,  SPE not required for this pt. SPI pamphlet provided to pt and he was encouraged to ask questions, talk about concerns, and share information with his support network.   Smart, Terence Googe 03/04/2013, 11:45 AM

## 2013-03-04 NOTE — [facility] Group Notes (Signed)
[facility] LCSW Group Therapy  03/04/2013 3:36 PM  Type of Therapy:  Group Therapy  Participation Level:  None  Participation Quality:  Drowsy  Affect:  Lethargic  Cognitive:  Lacking  Insight:  None  Engagement in Therapy:  None  Modes of Intervention:  Discussion, Education, Exploration, Limit-setting, Socialization and Support  Summary of Progress/Problems: Today's Topic: Overcoming Obstacles. Todd [name] fell asleep within a few minutes and did not wake up until called out by NP. He did not contribute to group discussion and does not appear to be making progress in the group setting at this time due to extreme drowsiness.   Smart, Petina Muraski 03/04/2013, 3:36 PM

## 2013-03-04 NOTE — Progress Notes (Signed)
[facility] MD Progress Note  03/04/2013 4:40 PM Todd [name]  MRN:  [id number] Subjective:  Sleep "good", appetite "is back", depression 3/10 and denies suicidal/homicidal thoughts and hallucinations, patient is not helping his racing thoughts feels it needs to be increased, discharge planned for [facility] tomorrow Diagnosis:   DSM5:  Trauma-Stressor Disorders:  Posttraumatic Stress Disorder (309.81) Substance/Addictive Disorders:  Alcohol Related Disorder - Severe (303.90) Depressive Disorders:  Major Depressive Disorder - Severe (296.23)  Axis I: ADHD, hyperactive type, Alcohol Abuse, Anxiety Disorder NOS, Major Depression, Recurrent severe and Post Traumatic Stress Disorder Axis II: Deferred Axis III:  Past Medical History  Diagnosis Date  . Hepatitis C   . Seizures     5 yrs ago with detox   Axis IV: other psychosocial or environmental problems, problems related to social environment and problems with primary support group Axis V: 51-60 moderate symptoms  ADL's:  Intact  Sleep: Good  Appetite:  Fair  Suicidal Ideation:  Denies Homicidal Ideation:  Denies  Psychiatric Specialty Exam: Review of Systems  Constitutional: Negative.   HENT: Negative.   Eyes: Negative.   Respiratory: Negative.   Cardiovascular: Negative.   Gastrointestinal: Negative.   Genitourinary: Negative.   Musculoskeletal: Negative.   Skin: Negative.   Neurological: Negative.   Endo/Heme/Allergies: Negative.   Psychiatric/Behavioral: Positive for substance abuse. The patient is nervous/anxious.     Blood pressure 126/86, pulse 79, temperature 98.2 F (36.8 C), temperature source Oral, resp. rate 16, height 6' (1.829 m), weight 89.359 kg (197 lb).Body mass index is 26.71 kg/(m^2).  General Appearance: Casual  Eye Contact::  Fair  Speech:  Normal Rate  Volume:  Normal  Mood:  Anxious  Affect:  Congruent  Thought Process:  Coherent  Orientation:  Full (Time, Place, and Person)  Thought Content:   WDL  Suicidal Thoughts:  No  Homicidal Thoughts:  No  Memory:  Immediate;   Fair Recent;   Fair Remote;   Fair  Judgement:  Fair  Insight:  Fair  Psychomotor Activity:  Normal  Concentration:  Good  Recall:  Fair  Akathisia:  No  Handed:  Right  AIMS (if indicated):     Assets:  Resilience  Sleep:  Number of Hours: 6.75   Current Medications: Current Facility-Administered Medications  Medication Dose Route Frequency Provider Last Rate Last Dose  . alum & mag hydroxide-simeth (MAALOX/MYLANTA) 200-200-20 MG/5ML suspension 30 mL  30 mL Oral Q4H PRN Nanine Means, NP      . chlordiazePOXIDE (LIBRIUM) capsule 25 mg  25 mg Oral Once Nanine Means, NP      . gabapentin (NEURONTIN) capsule 300 mg  300 mg Oral TID Rachael Fee, MD   300 mg at 03/04/13 1202  . gabapentin (NEURONTIN) capsule 300 mg  300 mg Oral QHS Rachael Fee, MD   300 mg at 03/03/13 2117  . ibuprofen (ADVIL,MOTRIN) tablet 800 mg  800 mg Oral QID Nanine Means, NP   800 mg at 03/04/13 1202  . magnesium hydroxide (MILK OF MAGNESIA) suspension 30 mL  30 mL Oral Daily PRN Nanine Means, NP   30 mL at 02/28/13 1154  . multivitamin with minerals tablet 1 tablet  1 tablet Oral Daily Nanine Means, NP   1 tablet at 03/04/13 0849  . nicotine (NICODERM CQ - dosed in mg/24 hours) patch 21 mg  21 mg Transdermal Daily Nanine Means, NP   21 mg at 03/04/13 0847  . PARoxetine (PAXIL) tablet 10 mg  10 mg Oral  Daily Rachael Fee, MD   10 mg at 03/04/13 0849  . QUEtiapine (SEROQUEL) tablet 100 mg  100 mg Oral QHS Rachael Fee, MD   100 mg at 03/03/13 2118  . QUEtiapine (SEROQUEL) tablet 100 mg  100 mg Oral TID Rachael Fee, MD   100 mg at 03/04/13 1202  . QUEtiapine (SEROQUEL) tablet 50 mg  50 mg Oral Q6H PRN Rachael Fee, MD   50 mg at 03/04/13 1205  . thiamine (B-1) injection 100 mg  100 mg Intramuscular Once Nanine Means, NP      . thiamine (VITAMIN B-1) tablet 100 mg  100 mg Oral Daily Nanine Means, NP   100 mg at 03/04/13 0849  .  traZODone (DESYREL) tablet 200 mg  200 mg Oral QHS Rachael Fee, MD   200 mg at 03/03/13 2117    Lab Results: No results found for this or any previous visit (from the past 48 hour(s)).  Physical Findings: AIMS: Facial and Oral Movements Muscles of Facial Expression: None, normal Lips and Perioral Area: None, normal Jaw: None, normal Tongue: None, normal,Extremity Movements Upper (arms, wrists, hands, fingers): None, normal Lower (legs, knees, ankles, toes): None, normal, Trunk Movements Neck, shoulders, hips: None, normal, Overall Severity Severity of abnormal movements (highest score from questions above): None, normal Incapacitation due to abnormal movements: None, normal Patient's awareness of abnormal movements (rate only patient's report): No Awareness, Dental Status Current problems with teeth and/or dentures?: No Does patient usually wear dentures?: No  CIWA:  CIWA-Ar Total: 2 COWS:  COWS Total Score: 1  Treatment Plan Summary: Daily contact with patient to assess and evaluate symptoms and progress in treatment Medication management  Plan:  Review of chart, vital signs, medications, and notes. 1-Individual and group therapy 2-Medication management for depression and anxiety:  Medications reviewed with the patient and Seroquel increased per request 3-Coping skills for depression, anxiety, and  4-Continue crisis stabilization and management 5-Address health issues--monitoring vital signs, stable 6-Treatment plan in progress to prevent relapse of depression and anxiety  Medical Decision Making Problem Points:  Established problem, stable/improving (1) and Review of psycho-social stressors (1) Data Points:  Review of medication regiment & side effects (2)  I certify that inpatient services furnished can reasonably be expected to improve the patient's condition.   Nanine Means, PMH-NP 03/04/2013, 4:40 PM  Reviewed the information documented and agree with the treatment  plan.  Debanhi Blaker,JANARDHAHA R. 03/05/2013 9:11 AM

## 2013-03-04 NOTE — Progress Notes (Signed)
D:Pt reports that his mood is irritable and anxious. He c/o pain in his lt heel from playing basketball in gym yesterday. Pt rates his depression as a 3 on 1-10 scale with 10 being the most depressed. A:Reported pain to NP and received order for ibuprofen. Gave as ordered. Offered support and 15 minute checks.  R:Pt denies si and hi. Safety maintained on the unit.

## 2013-03-04 NOTE — [facility] Group Notes (Signed)
[facility] LCSW Aftercare Discharge Planning Group Note   03/04/2013 9:43 AM  Participation Quality:  Appropriate   Mood/Affect:  Appropriate  Depression Rating:  1  Anxiety Rating:  5  Thoughts of Suicide:  No Will you contract for safety?   NA  Current AVH:  No  Plan for Discharge/Comments:  Pt reports that he is ready for d/c tomorrow morning and feels hopeful but anxious about going to [facility]. Pt plans to follow up at [facility] for med management. Pt stated that he is still experiencing mild mood swings and had been snapping at staff in the mornings but reports that he apologizes afterward.   Transportation Means: bus   Supports: cousins/limited family supports.   Smart, [employer]

## 2013-03-04 NOTE — Progress Notes (Signed)
Pt observed in the dayroom watching TV.  Pt states he is discharging tomorrow to [facility].  He says he is ok with this plan.  He is frustrated that his clothes are at his cousin's house and the cousin won't bring them to him.  He says other than that he is doing well.  He denies SI/HI/AV.  He is not having any withdrawal symptoms at this time. Pt makes his needs known to staff.  Support and encouragement offered.  Will review discharge orders/instructions with pt before bedtime d/t early morning discharge.  Safety maintained with q15 minute checks.

## 2013-03-05 NOTE — Progress Notes (Signed)
Pt was discharged this morning to go to [facility] by bus.  Pt's belongings from his locker were returned.  Discharge instructions were reviewed with the patient.  Pt voiced understanding.  Paperwork was signed.  Pt was given instructions on how and where to meet the bus.  Pt was given samples of his med and prescriptions along with a bus pass.   Pt voiced no needs or concerns.

## 2013-03-08 NOTE — Progress Notes (Signed)
Patient Discharge Instructions:  After Visit Summary (AVS):   Faxed to:  03/08/13 Psychiatric Admission Assessment Note:   Faxed to:  03/08/13 Suicide Risk Assessment - Discharge Assessment:   Faxed to:  03/08/13 Faxed/Sent to the Next Level Care provider:  03/08/13 Faxed to [facility] @ [phone] Faxed to [facility] @ [phone]  Jerelene Redden, 03/08/2013, 3:46 PM
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
## 2013-03-29 ENCOUNTER — Emergency Department (HOSPITAL_BASED_OUTPATIENT_CLINIC_OR_DEPARTMENT_OTHER): Payer: Self-pay

## 2013-03-29 ENCOUNTER — Encounter (HOSPITAL_BASED_OUTPATIENT_CLINIC_OR_DEPARTMENT_OTHER): Payer: Self-pay | Admitting: Emergency Medicine

## 2013-03-29 ENCOUNTER — Emergency Department (HOSPITAL_BASED_OUTPATIENT_CLINIC_OR_DEPARTMENT_OTHER)
Admission: EM | Admit: 2013-03-29 | Discharge: 2013-03-29 | Disposition: A | Discharge status: Home / Self Care | Payer: Self-pay | Attending: Emergency Medicine | Admitting: Emergency Medicine

## 2013-03-29 DIAGNOSIS — Y9368 Activity, volleyball (beach) (court): Secondary | ICD-10-CM | POA: Insufficient documentation

## 2013-03-29 DIAGNOSIS — Z880 Allergy status to penicillin: Secondary | ICD-10-CM | POA: Insufficient documentation

## 2013-03-29 DIAGNOSIS — S6390XA Sprain of unspecified part of unspecified wrist and hand, initial encounter: Secondary | ICD-10-CM | POA: Insufficient documentation

## 2013-03-29 DIAGNOSIS — W219XXA Striking against or struck by unspecified sports equipment, initial encounter: Secondary | ICD-10-CM | POA: Insufficient documentation

## 2013-03-29 DIAGNOSIS — S63615A Unspecified sprain of left ring finger, initial encounter: Secondary | ICD-10-CM

## 2013-03-29 DIAGNOSIS — Z79899 Other long term (current) drug therapy: Secondary | ICD-10-CM | POA: Insufficient documentation

## 2013-03-29 DIAGNOSIS — G40909 Epilepsy, unspecified, not intractable, without status epilepticus: Secondary | ICD-10-CM | POA: Insufficient documentation

## 2013-03-29 DIAGNOSIS — Y9239 Other specified sports and athletic area as the place of occurrence of the external cause: Secondary | ICD-10-CM | POA: Insufficient documentation

## 2013-03-29 DIAGNOSIS — Z8619 Personal history of other infectious and parasitic diseases: Secondary | ICD-10-CM | POA: Insufficient documentation

## 2013-03-29 DIAGNOSIS — F17200 Nicotine dependence, unspecified, uncomplicated: Secondary | ICD-10-CM | POA: Insufficient documentation

## 2013-03-29 NOTE — ED Notes (Signed)
Patient educated on proper wear & usage of static finger splint.

## 2013-03-29 NOTE — ED Provider Notes (Signed)
CSN: [id number]     Arrival date & time 03/29/13  0900 History   First MD Initiated Contact with Patient 03/29/13 [phone]     Chief Complaint  Patient presents with  . Finger Injury   (Consider location/radiation/quality/duration/timing/severity/associated sxs/prior Treatment) HPI Comments: Patient presents with complaints of pain in the left fourth finger for the past 2 days since injuring it while playing volleyball. He is able to bend the finger but this is uncomfortable to do so. He denies any numbness or tingling. He denies any history of any prior injury to this area.  The history is provided by the patient.    Past Medical History  Diagnosis Date  . Hepatitis C   . Seizures     5 yrs ago with detox   History reviewed. No pertinent past surgical history. No family history on file. History  Substance Use Topics  . Smoking status: Current Every Day Smoker -- 1.00 packs/day    Types: Cigarettes  . Smokeless tobacco: Not on file  . Alcohol Use: Yes     Comment: "alot"- In [facility]    Review of Systems  All other systems reviewed and are negative.    Allergies  Fish allergy and Penicillins  Home Medications   Current Outpatient Rx  Name  Route  Sig  Dispense  Refill  . gabapentin (NEURONTIN) 300 MG capsule   Oral   Take 1 capsule (300 mg total) by mouth 4 (four) times daily.   120 capsule   0   . PARoxetine (PAXIL) 10 MG tablet   Oral   Take 1 tablet (10 mg total) by mouth daily.   30 tablet   0   . QUEtiapine (SEROQUEL) 100 MG tablet   Oral   Take 1 tablet (100 mg total) by mouth QID.   120 tablet   0   . traZODone (DESYREL) 100 MG tablet   Oral   Take 2 tablets (200 mg total) by mouth at bedtime.   60 tablet   0    BP 146/91  Pulse 83  Temp(Src) 98.7 F (37.1 C) (Oral)  Resp 18  Ht 6\' 2"  (1.88 m)  SpO2 99% Physical Exam  Nursing note and vitals reviewed. Constitutional: He is oriented to person, place, and time. He appears well-developed and  well-nourished.  HENT:  Head: Normocephalic.  Neck: Normal range of motion. Neck supple.  Musculoskeletal:  The left fourth finger appears grossly normal without swelling or deformity. There is tenderness to palpation over the dorsal aspect of the middle phalanx. He is able to flex and extend his finger without difficulty. Refill and sensation are intact to the finger tip.  Neurological: He is alert and oriented to person, place, and time.  Skin: Skin is warm and dry.    ED Course  Procedures (including critical care time) Labs Review Labs Reviewed - No data to display Imaging Review Dg Finger Ring Left  03/29/2013   CLINICAL DATA:  Left ring finger injury 03/26/2013 with continued pain.  EXAM: LEFT RING FINGER 2+V  COMPARISON:  None.  FINDINGS: Imaged bones, joints and soft tissues appear normal.  IMPRESSION: Negative exam.   Electronically Signed   By: Drusilla Kanner M.D.   On: 03/29/2013 09:38    EKG Interpretation   None       MDM  No diagnosis found. This appears to be a sprain. We'll place in a splint recommend NSAIDs and followup when necessary.    Geoffery Lyons, MD  03/29/13 0948 

## 2013-03-29 NOTE — ED Notes (Signed)
Pt c/o finger pain to left ring finger since Tuesday after jamming it with a volleyball. CMS intact.

## 2013-05-23 ENCOUNTER — Emergency Department (INDEPENDENT_AMBULATORY_CARE_PROVIDER_SITE_OTHER)
Admission: EM | Admit: 2013-05-23 | Discharge: 2013-05-23 | Disposition: A | Discharge status: Home / Self Care | Payer: Self-pay | Source: Home / Self Care | Attending: Emergency Medicine | Admitting: Emergency Medicine

## 2013-05-23 ENCOUNTER — Encounter (HOSPITAL_COMMUNITY): Payer: Self-pay | Admitting: Emergency Medicine

## 2013-05-23 DIAGNOSIS — J069 Acute upper respiratory infection, unspecified: Secondary | ICD-10-CM

## 2013-05-23 LAB — POCT RAPID STREP A
Streptococcus, Group A Screen (Direct): NEGATIVE
Streptococcus, Group A Screen (Direct): NEGATIVE

## 2013-05-23 MED ORDER — ONDANSETRON 8 MG PO TBDP: 8.0000 mg | ORAL_TABLET | Freq: Three times a day (TID) | ORAL | Status: DC | PRN

## 2013-05-23 MED ORDER — BENZONATATE 200 MG PO CAPS: 200.0000 mg | ORAL_CAPSULE | Freq: Three times a day (TID) | ORAL | Status: DC | PRN

## 2013-05-23 NOTE — ED Notes (Signed)
Seen by physician.

## 2013-05-23 NOTE — Discharge Instructions (Signed)

## 2013-05-23 NOTE — ED Provider Notes (Signed)
  Chief Complaint   Chief Complaint  Patient presents with  . URI    History of Present Illness   Todd [name] is a 38 year old male who has had a history since last night of nausea, headache, nasal congestion, rhinorrhea with yellow drainage, watering of the eyes, soreness, sinus pressure, ear congestion, sore throat, cough productive yellow sputum, chest pain, low-grade fever, and myalgias. He's been exposed to sick coworkers.  Review of Systems   Other than as noted above, the patient denies any of the following symptoms: Systemic:  No fevers, chills, sweats, or myalgias. Eye:  No redness or discharge. ENT:  No ear pain, headache, nasal congestion, drainage, sinus pressure, or sore throat. Neck:  No neck pain, stiffness, or swollen glands. Lungs:  No cough, sputum production, hemoptysis, wheezing, chest tightness, shortness of breath or chest pain. GI:  No abdominal pain, nausea, vomiting or diarrhea.  PMFSH   Past medical history, family history, social history, meds, and allergies were reviewed. He's allergic to penicillin.  Physical exam   Vital signs:  BP 149/88  Pulse 92  Temp(Src) 99.6 F (37.6 C) (Oral)  Resp 18  SpO2 98% General:  Alert and oriented.  In no distress.  Skin warm and dry. Eye:  No conjunctival injection or drainage. Lids were normal. ENT:  TMs and canals were normal, without erythema or inflammation.  Nasal mucosa was clear and uncongested, without drainage.  Mucous membranes were moist.  Pharynx was erythematous with no exudate or drainage.  There were no oral ulcerations or lesions. Neck:  Supple, no adenopathy, tenderness or mass. Lungs:  No respiratory distress.  Lungs were clear to auscultation, without wheezes, rales or rhonchi.  Breath sounds were clear and equal bilaterally.  Heart:  Regular rhythm, without gallops, murmers or rubs. Skin:  Clear, warm, and dry, without rash or lesions.  Labs   Results for orders placed during the  hospital encounter of 05/23/13  POCT RAPID STREP A ([facility]RG CARE ONLY)      Result Value Range   Streptococcus, Group A Screen (Direct) NEGATIVE  NEGATIVE  POCT RAPID STREP A ([facility] ONLY)      Result Value Range   Streptococcus, Group A Screen (Direct) NEGATIVE  NEGATIVE    Assessment     The encounter diagnosis was Viral upper respiratory infection.  No indication for antibiotics.  Plan    1.  Meds:  The following meds were prescribed:   New Prescriptions   BENZONATATE (TESSALON) 200 MG CAPSULE    Take 1 capsule (200 mg total) by mouth 3 (three) times daily as needed for cough.   ONDANSETRON (ZOFRAN ODT) 8 MG DISINTEGRATING TABLET    Take 1 tablet (8 mg total) by mouth every 8 (eight) hours as needed for nausea.    2.  Patient Education/Counseling:  The patient was given appropriate handouts, self care instructions, and instructed in symptomatic relief.  Instructed to get extra fluids, rest, and use a cool mist vaporizer.   3.  Follow up:  The patient was told to follow up here if no better in 3 to 4 days, or sooner if becoming worse in any way, and given some red flag symptoms such as increasing fever, difficulty breathing, chest pain, or persistent vomiting which would prompt immediate return.  Follow up here as needed.      Reuben Likesavid C Dazaria Macneill, MD 05/23/13 1425

## 2013-05-23 NOTE — ED Notes (Signed)
djischarged by Jadene Pieriniramon, emt

## 2013-05-25 LAB — CULTURE, GROUP A STREP

## 2014-06-23 ENCOUNTER — Encounter (HOSPITAL_BASED_OUTPATIENT_CLINIC_OR_DEPARTMENT_OTHER): Payer: Self-pay | Admitting: Emergency Medicine

## 2014-06-23 ENCOUNTER — Emergency Department (HOSPITAL_BASED_OUTPATIENT_CLINIC_OR_DEPARTMENT_OTHER)
Admission: EM | Admit: 2014-06-23 | Discharge: 2014-06-23 | Disposition: A | Discharge status: Home / Self Care | Payer: Self-pay | Attending: Emergency Medicine | Admitting: Emergency Medicine

## 2014-06-23 DIAGNOSIS — Z8619 Personal history of other infectious and parasitic diseases: Secondary | ICD-10-CM | POA: Insufficient documentation

## 2014-06-23 DIAGNOSIS — Z791 Long term (current) use of non-steroidal anti-inflammatories (NSAID): Secondary | ICD-10-CM | POA: Insufficient documentation

## 2014-06-23 DIAGNOSIS — G8929 Other chronic pain: Secondary | ICD-10-CM | POA: Insufficient documentation

## 2014-06-23 DIAGNOSIS — S39012A Strain of muscle, fascia and tendon of lower back, initial encounter: Secondary | ICD-10-CM | POA: Insufficient documentation

## 2014-06-23 DIAGNOSIS — Y998 Other external cause status: Secondary | ICD-10-CM | POA: Insufficient documentation

## 2014-06-23 DIAGNOSIS — Y92410 Unspecified street and highway as the place of occurrence of the external cause: Secondary | ICD-10-CM | POA: Insufficient documentation

## 2014-06-23 DIAGNOSIS — Z880 Allergy status to penicillin: Secondary | ICD-10-CM | POA: Insufficient documentation

## 2014-06-23 DIAGNOSIS — Z720 Tobacco use: Secondary | ICD-10-CM | POA: Insufficient documentation

## 2014-06-23 DIAGNOSIS — Y9389 Activity, other specified: Secondary | ICD-10-CM | POA: Insufficient documentation

## 2014-06-23 MED ORDER — KETOROLAC TROMETHAMINE 60 MG/2ML IM SOLN
60.0000 mg | Freq: Once | INTRAMUSCULAR | Status: AC
  Administered 2014-06-23: 60 mg via INTRAMUSCULAR
  Filled 2014-06-23: qty 2

## 2014-06-23 MED ORDER — HYDROCODONE-ACETAMINOPHEN 5-325 MG PO TABS: 2.0000 | ORAL_TABLET | ORAL | Status: DC | PRN

## 2014-06-23 MED ORDER — METHOCARBAMOL 500 MG PO TABS: 500.0000 mg | ORAL_TABLET | Freq: Two times a day (BID) | ORAL | Status: DC | PRN

## 2014-06-23 MED ORDER — NAPROXEN 500 MG PO TABS
500.0000 mg | ORAL_TABLET | Freq: Two times a day (BID) | ORAL | Status: DC
Start: 2014-06-23 — End: 2016-03-14
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
## 2014-06-23 NOTE — Discharge Instructions (Signed)
Back Pain: ° ° °Your back pain should be treated with medicines such as ibuprofen or aleve and this back pain should get better over the next 2 weeks.  However if you develop severe or worsening pain, low back pain with fever, numbness, weakness or inability to walk or urinate, you should return to the ER immediately.  Please follow up with your doctor this week for a recheck if still having symptoms. °Low back pain is discomfort in the lower back that may be due to injuries to muscles and ligaments around the spine.  Occasionally, it may be caused by a a problem to a part of the spine called a disc.  The pain may last several days or a week;  However, most patients get completely well in 4 weeks. ° °Self - care:  The application of heat can help soothe the pain.  Maintaining your daily activities, including walking, is encourged, as it will help you get better faster than just staying in bed. ° °Medications are also useful to help with pain control.  A commonly prescribed medications includes acetaminophen.  This medication is generally safe, though you should not take more than 8 of the extra strength (500mg) pills a day. ° °Non steroidal anti inflammatory medications including Ibuprofen and naproxen;  These medications help both pain and swelling and are very useful in treating back pain.  They should be taken with food, as they can cause stomach upset, and more seriously, stomach bleeding.   ° °Muscle relaxants:  These medications can help with muscle tightness that is a cause of lower back pain.  Most of these medications can cause drowsiness, and it is not safe to drive or use dangerous machinery while taking them. ° °You will need to follow up with  Your primary healthcare provider in 1-2 weeks for reassessment. ° °Be aware that if you develop new symptoms, such as a fever, leg weakness, difficulty with or loss of control of your urine or bowels, abdominal pain, or more severe pain, you will need to seek  medical attention and  / or return to the Emergency department. ° °If you do not have a doctor see the list below. ° °RESOURCE GUIDE ° °Chronic Pain Problems: °Contact  [facility]  297-2271 °Patients need to be referred by their primary care doctor. ° °Insufficient Money for Medicine: °Contact United Way:  call "211" or [facility] 271-5999. ° °No Primary Care Doctor: °- Call [facility]  832-8000 - can help you locate a primary care doctor that  accepts your insurance, provides certain services, etc. °- Physician Referral Service- 1-800-533-3463 ° °Agencies that provide inexpensive medical care: °- [facility]  832-8035 °- [facility]  832-7272 °- [facility]  [phone] °- [facility]  832-4777 °- [facility]  373-0678 °- [facility]  272-1050 ° °Medicaid-accepting [place] Providers: °- [facility]- [address] ° [phone], Mon-Fri 9am-7pm, Sat 9am-1pm °- [facility]- [address], [address] ° [phone] °- [facility]- [address], [address] ° [phone] °- [facility]- [address] [place] [address] ° [phone] °- [facility]- [address], [address], [phone] ° Only accepts [place] Access Medicaid patients after they have their name  applied to their card ° °Self Pay (no insurance) in [place]: °- Sickle Cell Patients: Dr Eric Dean, [facility] °   [address], [phone] °- [facility]- [address] ° [phone] °      -     [facility]- [address] °      -     [facility]- see information above (Speak to Pam H if you do not have insurance) °      -  [facility]- [address], [phone] °      -  [facility] [place]- [address],  [phone] °      -  [facility]- [address] [place] [address],  [phone] °      -  Dr Osei-Bonsu-  [address], [place], [phone] °      -  [facility]- [address], [phone] °      -  [facility]- [address] [place] [address], [phone], also [address], [phone] °      -    [facility]- [address], [phone], 1st & 3rd Saturday   every month, 10am-1pm ° °1) Find a Doctor and Pay Out of Pocket °Although you won't have to find out who is covered by your insurance plan, it is a good idea to ask around and get recommendations. You will then need to call the office and see if the doctor you have chosen will accept you as a new patient and what types of options they offer for patients who are self-pay. Some doctors offer discounts or will set up payment plans for their patients who do not have insurance, but you will need to ask so you aren't surprised when you get to your appointment. ° °2) Contact Your Local Health Department °Not all health departments have doctors that can see patients for sick visits, but many do, so it is worth a call to see if yours does. If you don't know where your local health department is, you can check in your phone book. The CDC also has a tool to help you locate your state's health department, and many state websites also have listings of all of their local health departments. ° °3) Find a Walk-in Clinic °If your illness is not likely to be very severe or complicated, you may want to try a walk in clinic. These are popping up all over the country in pharmacies, drugstores, and shopping centers. They're usually staffed by nurse practitioners or physician assistants that have been trained to treat common illnesses and complaints. They're usually fairly quick and inexpensive. However, if you have serious medical issues or chronic medical problems, these are probably not your best option ° °STD Testing °- [facility] [place], [facility] Clinic, [address], [place], phone [phone] or 1-877-539-9860.  Monday - Friday, call for an appointment. °- [place] [facility] [place], [facility] Clinic, [address], [place], phone [phone] or 1-877-539-9860.  Monday - Friday, call for an appointment. ° °Abuse/Neglect: °- [place] Child Abuse Hotline (336) 641-3795 °- [place] Child Abuse Hotline 800-378-5315 (After Hours) ° °Emergency Shelter:  [facility] (336) 271-5985 ° °Maternity Homes: °- [facility] (336) 275-9566 °- [facility] (704) 372-4663 ° °MRSA Hotline #:   832-7006 ° °[place] Resources ° °[facility] [place]  [employer] [facility] [facility] °[address]                 [address]         [address]  °[place]                                               [place]                              [place] °Phone:  349-3220                                  Phone:  [phone]                   Phone:  342-8140 ° °[facility], 342-8316 °- [place] [facility] - [facility]- 1-888-581-9988 °      -     [facility] in [place], [address],                                  336-349-4454, Insurance ° °[place] Child Abuse Hotline °(336) 342-1394 or (336) 342-3537 (After Hours) ° ° °Behavioral Health Services ° °Substance Abuse Resources: °- [facility]  336-882-2125 °- [facility] 336-784-9470 °- [facility] 336-285-9073 °- [facility] 336-845-3988 °- Residential & Outpatient Substance Abuse Program  800-659-3381 ° °Psychological Services: °- [facility]  832-9600 °- [facility]  378-7881 °- [facility], [address], [place], ACCESS LINE: 1-800-853-5163 or 336-641-4981, Http://www.[facility].com/services/adult.htm ° °Dental Assistance ° °If unable to pay or  uninsured, contact:  [facility] or [facility] to become qualified for the adult dental clinic. ° °Patients with Medicaid: [facility] °[address], 632-0744 °[address], 510-2600 ° °If unable to pay, or uninsured, contact [facility] (271-5999) or [place] [facility] (641-3152 in [place], 842-7733 in [place]) to become qualified for the adult dental clinic ° °Other Low-Cost Community Dental Services: °- [facility]- [address], [place], [place], [address], 723-1848, Ext. [address], 2nd and 4th Thursday of the month at 6:30am.  10 clients each day by appointment, can sometimes see walk-in patients if someone does not show for an appointment. °- [facility]- [address], [place], [place], [address], 723-7904 °- [facility]- [address], [place], [place], [address], 631-2330 °- [facility]- 342-8273 °- [facility]- 703-3100 °- [facility]- 570-6415 ° ° ° ° ° ° °
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
## 2014-10-21 ENCOUNTER — Ambulatory Visit: Payer: Self-pay | Admitting: Internal Medicine

## 2014-10-21 DIAGNOSIS — Z0289 Encounter for other administrative examinations: Secondary | ICD-10-CM

## 2015-10-27 DIAGNOSIS — F331 Major depressive disorder, recurrent, moderate: Secondary | ICD-10-CM | POA: Insufficient documentation

## 2015-10-30 DIAGNOSIS — F3181 Bipolar II disorder: Secondary | ICD-10-CM | POA: Insufficient documentation

## 2016-03-09 ENCOUNTER — Emergency Department (HOSPITAL_COMMUNITY)
Admission: EM | Admit: 2016-03-09 | Discharge: 2016-03-10 | Disposition: A | Discharge status: Other Acute Inpatient Hospital | Payer: Self-pay | Attending: Emergency Medicine | Admitting: Emergency Medicine

## 2016-03-09 ENCOUNTER — Encounter (HOSPITAL_COMMUNITY): Payer: Self-pay | Admitting: Emergency Medicine

## 2016-03-09 DIAGNOSIS — F332 Major depressive disorder, recurrent severe without psychotic features: Secondary | ICD-10-CM | POA: Insufficient documentation

## 2016-03-09 DIAGNOSIS — Z79899 Other long term (current) drug therapy: Secondary | ICD-10-CM | POA: Insufficient documentation

## 2016-03-09 DIAGNOSIS — F1410 Cocaine abuse, uncomplicated: Secondary | ICD-10-CM | POA: Insufficient documentation

## 2016-03-09 DIAGNOSIS — F1910 Other psychoactive substance abuse, uncomplicated: Secondary | ICD-10-CM

## 2016-03-09 DIAGNOSIS — F1920 Other psychoactive substance dependence, uncomplicated: Secondary | ICD-10-CM

## 2016-03-09 DIAGNOSIS — R45851 Suicidal ideations: Secondary | ICD-10-CM

## 2016-03-09 DIAGNOSIS — F17210 Nicotine dependence, cigarettes, uncomplicated: Secondary | ICD-10-CM | POA: Insufficient documentation

## 2016-03-09 DIAGNOSIS — F4310 Post-traumatic stress disorder, unspecified: Secondary | ICD-10-CM

## 2016-03-09 DIAGNOSIS — F1110 Opioid abuse, uncomplicated: Secondary | ICD-10-CM | POA: Insufficient documentation

## 2016-03-09 DIAGNOSIS — F1120 Opioid dependence, uncomplicated: Secondary | ICD-10-CM

## 2016-03-09 LAB — CBC
HEMATOCRIT: 45.8 % (ref 39.0–52.0)
HEMOGLOBIN: 16.5 g/dL (ref 13.0–17.0)
MCH: 30.8 pg (ref 26.0–34.0)
MCHC: 36 g/dL (ref 30.0–36.0)
MCV: 85.4 fL (ref 78.0–100.0)
PLATELETS: 276 10*3/uL (ref 150–400)
RBC: 5.36 MIL/uL (ref 4.22–5.81)
RDW: 12.9 % (ref 11.5–15.5)
WBC: 10.6 10*3/uL — AB (ref 4.0–10.5)

## 2016-03-09 LAB — COMPREHENSIVE METABOLIC PANEL
ALBUMIN: 4.4 g/dL (ref 3.5–5.0)
ALT: 42 U/L (ref 17–63)
AST: 50 U/L — ABNORMAL HIGH (ref 15–41)
Alkaline Phosphatase: 65 U/L (ref 38–126)
Anion gap: 10 (ref 5–15)
BUN: 11 mg/dL (ref 6–20)
CHLORIDE: 101 mmol/L (ref 101–111)
CO2: 24 mmol/L (ref 22–32)
CREATININE: 1.29 mg/dL — AB (ref 0.61–1.24)
Calcium: 9.2 mg/dL (ref 8.9–10.3)
GFR calc Af Amer: >60 mL/min (ref >60)
GLUCOSE: 112 mg/dL — AB (ref 65–99)
POTASSIUM: 3.2 mmol/L — AB (ref 3.5–5.1)
Sodium: 135 mmol/L (ref 135–145)
Total Bilirubin: 1.6 mg/dL — ABNORMAL HIGH (ref 0.3–1.2)
Total Protein: 7.8 g/dL (ref 6.5–8.1)

## 2016-03-09 LAB — RAPID URINE DRUG SCREEN, HOSP PERFORMED
AMPHETAMINES: NOT DETECTED
BENZODIAZEPINES: NOT DETECTED
Barbiturates: NOT DETECTED
Cocaine: POSITIVE — AB
Opiates: POSITIVE — AB
TETRAHYDROCANNABINOL: NOT DETECTED

## 2016-03-09 LAB — ETHANOL

## 2016-03-09 LAB — SALICYLATE LEVEL: Salicylate Lvl: <7 mg/dL (ref 2.8–30.0)

## 2016-03-09 LAB — CBG MONITORING, ED: GLUCOSE-CAPILLARY: 114 mg/dL — AB (ref 65–99)

## 2016-03-09 LAB — ACETAMINOPHEN LEVEL: Acetaminophen (Tylenol), Serum: <10 ug/mL — ABNORMAL LOW (ref 10–30)

## 2016-03-09 NOTE — ED Triage Notes (Signed)
Pt reports using Molly, cocaine, and heroine for the last 2 days. Pt reports wanting to kill himself from overdose. Pt also reporting chest pain and shortness of breath that started today.

## 2016-03-09 NOTE — ED Notes (Signed)
Pt dressed out in maroon scrubs

## 2016-03-10 ENCOUNTER — Encounter (HOSPITAL_COMMUNITY): Payer: Self-pay | Admitting: *Deleted

## 2016-03-10 ENCOUNTER — Inpatient Hospital Stay (HOSPITAL_COMMUNITY)
Admission: EM | Admit: 2016-03-10 | Discharge: 2016-03-14 | DRG: 897 | Disposition: A | Discharge status: Rehabilitation Facility | Payer: Federal, State, Local not specified - Other | Source: Intra-hospital | Attending: Psychiatry | Admitting: Psychiatry

## 2016-03-10 DIAGNOSIS — F332 Major depressive disorder, recurrent severe without psychotic features: Secondary | ICD-10-CM | POA: Insufficient documentation

## 2016-03-10 DIAGNOSIS — F1120 Opioid dependence, uncomplicated: Secondary | ICD-10-CM | POA: Diagnosis present

## 2016-03-10 DIAGNOSIS — G4700 Insomnia, unspecified: Secondary | ICD-10-CM | POA: Diagnosis not present

## 2016-03-10 DIAGNOSIS — G8929 Other chronic pain: Secondary | ICD-10-CM | POA: Diagnosis present

## 2016-03-10 DIAGNOSIS — B1920 Unspecified viral hepatitis C without hepatic coma: Secondary | ICD-10-CM | POA: Diagnosis present

## 2016-03-10 DIAGNOSIS — F419 Anxiety disorder, unspecified: Secondary | ICD-10-CM | POA: Diagnosis present

## 2016-03-10 DIAGNOSIS — F17210 Nicotine dependence, cigarettes, uncomplicated: Secondary | ICD-10-CM | POA: Diagnosis not present

## 2016-03-10 DIAGNOSIS — F1010 Alcohol abuse, uncomplicated: Secondary | ICD-10-CM | POA: Diagnosis present

## 2016-03-10 DIAGNOSIS — F1920 Other psychoactive substance dependence, uncomplicated: Principal | ICD-10-CM | POA: Diagnosis present

## 2016-03-10 DIAGNOSIS — Z79899 Other long term (current) drug therapy: Secondary | ICD-10-CM | POA: Diagnosis not present

## 2016-03-10 DIAGNOSIS — Z91013 Allergy to seafood: Secondary | ICD-10-CM

## 2016-03-10 DIAGNOSIS — I10 Essential (primary) hypertension: Secondary | ICD-10-CM | POA: Diagnosis present

## 2016-03-10 DIAGNOSIS — F4310 Post-traumatic stress disorder, unspecified: Secondary | ICD-10-CM | POA: Diagnosis present

## 2016-03-10 DIAGNOSIS — Z880 Allergy status to penicillin: Secondary | ICD-10-CM | POA: Diagnosis not present

## 2016-03-10 DIAGNOSIS — F1994 Other psychoactive substance use, unspecified with psychoactive substance-induced mood disorder: Secondary | ICD-10-CM | POA: Diagnosis present

## 2016-03-10 DIAGNOSIS — F22 Delusional disorders: Secondary | ICD-10-CM | POA: Diagnosis present

## 2016-03-10 MED ORDER — VITAMIN B-1 100 MG PO TABS
100.0000 mg | ORAL_TABLET | Freq: Every day | ORAL | Status: DC
  Administered 2016-03-10 – 2016-03-14 (×5): 100 mg via ORAL
  Filled 2016-03-10 (×7): qty 1

## 2016-03-10 MED ORDER — VITAMIN B-1 100 MG PO TABS
100.0000 mg | ORAL_TABLET | Freq: Every day | ORAL | Status: DC
  Filled 2016-03-10 (×2): qty 1

## 2016-03-10 MED ORDER — POTASSIUM CHLORIDE CRYS ER 20 MEQ PO TBCR
40.0000 meq | EXTENDED_RELEASE_TABLET | Freq: Once | ORAL | Status: AC
  Administered 2016-03-10: 40 meq via ORAL
  Filled 2016-03-10 (×2): qty 2

## 2016-03-10 MED ORDER — LORAZEPAM 1 MG PO TABS
1.0000 mg | ORAL_TABLET | Freq: Four times a day (QID) | ORAL | Status: AC | PRN
  Administered 2016-03-11: 1 mg via ORAL
  Filled 2016-03-10: qty 1

## 2016-03-10 MED ORDER — LORAZEPAM 1 MG PO TABS: 0.0000 mg | ORAL_TABLET | Freq: Two times a day (BID) | ORAL | Status: DC

## 2016-03-10 MED ORDER — LORAZEPAM 1 MG PO TABS
1.0000 mg | ORAL_TABLET | Freq: Every day | ORAL | Status: AC
  Administered 2016-03-14: 1 mg via ORAL
  Filled 2016-03-10: qty 1

## 2016-03-10 MED ORDER — MAGNESIUM HYDROXIDE 400 MG/5ML PO SUSP: 30.0000 mL | Freq: Every day | ORAL | Status: DC | PRN

## 2016-03-10 MED ORDER — HYDROXYZINE HCL 25 MG PO TABS
25.0000 mg | ORAL_TABLET | Freq: Four times a day (QID) | ORAL | Status: DC | PRN
  Administered 2016-03-11 – 2016-03-13 (×3): 25 mg via ORAL
  Filled 2016-03-10 (×2): qty 1
  Filled 2016-03-10: qty 30
  Filled 2016-03-10: qty 1

## 2016-03-10 MED ORDER — CLONIDINE HCL 0.1 MG PO TABS
0.1000 mg | ORAL_TABLET | Freq: Every day | ORAL | Status: DC
  Filled 2016-03-10: qty 1

## 2016-03-10 MED ORDER — QUETIAPINE FUMARATE 100 MG PO TABS
100.0000 mg | ORAL_TABLET | Freq: Every day | ORAL | Status: DC
  Administered 2016-03-10 – 2016-03-11 (×2): 100 mg via ORAL
  Filled 2016-03-10 (×4): qty 1

## 2016-03-10 MED ORDER — ONDANSETRON 4 MG PO TBDP
4.0000 mg | ORAL_TABLET | Freq: Four times a day (QID) | ORAL | Status: DC | PRN
Start: 2016-03-10 — End: 2016-03-14
  Administered 2016-03-11: 4 mg via ORAL
  Filled 2016-03-10: qty 1

## 2016-03-10 MED ORDER — DICYCLOMINE HCL 20 MG PO TABS: 20.0000 mg | ORAL_TABLET | Freq: Four times a day (QID) | ORAL | Status: DC | PRN

## 2016-03-10 MED ORDER — POTASSIUM CHLORIDE CRYS ER 20 MEQ PO TBCR
40.0000 meq | EXTENDED_RELEASE_TABLET | Freq: Once | ORAL | Status: AC
  Administered 2016-03-10: 40 meq via ORAL
  Filled 2016-03-10: qty 2

## 2016-03-10 MED ORDER — LORAZEPAM 1 MG PO TABS: 0.0000 mg | ORAL_TABLET | Freq: Four times a day (QID) | ORAL | Status: DC

## 2016-03-10 MED ORDER — CLONIDINE HCL 0.1 MG PO TABS
0.1000 mg | ORAL_TABLET | Freq: Four times a day (QID) | ORAL | Status: AC
  Administered 2016-03-10 – 2016-03-11 (×7): 0.1 mg via ORAL
  Filled 2016-03-10 (×10): qty 1

## 2016-03-10 MED ORDER — LOPERAMIDE HCL 2 MG PO CAPS: 2.0000 mg | ORAL_CAPSULE | ORAL | Status: DC | PRN

## 2016-03-10 MED ORDER — ONDANSETRON HCL 4 MG PO TABS: 4.0000 mg | ORAL_TABLET | Freq: Three times a day (TID) | ORAL | Status: DC | PRN

## 2016-03-10 MED ORDER — IBUPROFEN 200 MG PO TABS: 600.0000 mg | ORAL_TABLET | Freq: Three times a day (TID) | ORAL | Status: DC | PRN

## 2016-03-10 MED ORDER — IBUPROFEN 600 MG PO TABS: 600.0000 mg | ORAL_TABLET | Freq: Three times a day (TID) | ORAL | Status: DC | PRN

## 2016-03-10 MED ORDER — LORAZEPAM 2 MG/ML IJ SOLN
1.0000 mg | Freq: Once | INTRAMUSCULAR | Status: AC
  Administered 2016-03-10: 1 mg via INTRAVENOUS
  Filled 2016-03-10: qty 1

## 2016-03-10 MED ORDER — NAPROXEN 500 MG PO TABS: 500.0000 mg | ORAL_TABLET | Freq: Two times a day (BID) | ORAL | Status: DC | PRN

## 2016-03-10 MED ORDER — ZOLPIDEM TARTRATE 5 MG PO TABS: 5.0000 mg | ORAL_TABLET | Freq: Every evening | ORAL | Status: DC | PRN

## 2016-03-10 MED ORDER — ACETAMINOPHEN 325 MG PO TABS: 650.0000 mg | ORAL_TABLET | ORAL | Status: DC | PRN

## 2016-03-10 MED ORDER — LORAZEPAM 1 MG PO TABS
1.0000 mg | ORAL_TABLET | Freq: Two times a day (BID) | ORAL | Status: AC
  Administered 2016-03-12 – 2016-03-13 (×2): 1 mg via ORAL
  Filled 2016-03-10: qty 1

## 2016-03-10 MED ORDER — LORAZEPAM 1 MG PO TABS
1.0000 mg | ORAL_TABLET | Freq: Four times a day (QID) | ORAL | Status: AC
  Administered 2016-03-10 – 2016-03-11 (×4): 1 mg via ORAL
  Filled 2016-03-10 (×4): qty 1

## 2016-03-10 MED ORDER — NICOTINE 21 MG/24HR TD PT24
21.0000 mg | MEDICATED_PATCH | Freq: Every day | TRANSDERMAL | Status: DC
  Administered 2016-03-10 – 2016-03-13 (×4): 21 mg via TRANSDERMAL
  Filled 2016-03-10 (×8): qty 1

## 2016-03-10 MED ORDER — ALUM & MAG HYDROXIDE-SIMETH 200-200-20 MG/5ML PO SUSP: 30.0000 mL | ORAL | Status: DC | PRN

## 2016-03-10 MED ORDER — CLONIDINE HCL 0.1 MG PO TABS
0.1000 mg | ORAL_TABLET | ORAL | Status: AC
  Administered 2016-03-12 – 2016-03-13 (×4): 0.1 mg via ORAL
  Filled 2016-03-10 (×4): qty 1

## 2016-03-10 MED ORDER — LORAZEPAM 1 MG PO TABS: 1.0000 mg | ORAL_TABLET | Freq: Four times a day (QID) | ORAL | Status: DC | PRN

## 2016-03-10 MED ORDER — METHOCARBAMOL 500 MG PO TABS
500.0000 mg | ORAL_TABLET | Freq: Three times a day (TID) | ORAL | Status: DC | PRN
  Administered 2016-03-11: 500 mg via ORAL
  Filled 2016-03-10: qty 1

## 2016-03-10 MED ORDER — LIP MEDEX EX OINT
TOPICAL_OINTMENT | CUTANEOUS | Status: DC | PRN
  Filled 2016-03-10: qty 7

## 2016-03-10 MED ORDER — LORAZEPAM 1 MG PO TABS
1.0000 mg | ORAL_TABLET | Freq: Three times a day (TID) | ORAL | Status: AC
  Administered 2016-03-11 – 2016-03-12 (×3): 1 mg via ORAL
  Filled 2016-03-10 (×3): qty 1

## 2016-03-10 MED ORDER — ADULT MULTIVITAMIN W/MINERALS CH
1.0000 | ORAL_TABLET | Freq: Every day | ORAL | Status: DC
  Administered 2016-03-10 – 2016-03-14 (×5): 1 via ORAL
  Filled 2016-03-10 (×6): qty 1

## 2016-03-10 MED ORDER — ACETAMINOPHEN 325 MG PO TABS
650.0000 mg | ORAL_TABLET | ORAL | Status: DC | PRN
  Administered 2016-03-13: 650 mg via ORAL
  Filled 2016-03-10: qty 2

## 2016-03-10 MED ORDER — THIAMINE HCL 100 MG/ML IJ SOLN: 100.0000 mg | Freq: Once | INTRAMUSCULAR | Status: DC

## 2016-03-10 NOTE — ED Notes (Signed)
[facility], asked to call back after they finish report.
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
## 2016-03-10 NOTE — ED Notes (Signed)
[facility] called for transport. 
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
## 2016-03-10 NOTE — BH Assessment (Signed)
[facility] Assessment Progress Note  Per Todd SievertSpencer Simon, PA, this pt requires psychiatric hospitalization at this time.  Todd AbedLindsay, RN, [id number]  Pt has signed Voluntary Admission and Consent for Treatment, as well as Consent to Release Information to no one, and signed [facility].  Pt's nurse has been notified, and agrees to send original paperwork along with pt via [place], and to call report to [phone].  Doylene Canninghomas Devetta Hagenow, MA Triage Specialist [phone]

## 2016-03-10 NOTE — ED Notes (Signed)
Pt's belongings secured in cabinet ([id number]) on the secretary's side.
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
## 2016-03-10 NOTE — ED Provider Notes (Signed)
[facility]-EMERGENCY DEPT Provider Note   CSN: [id number] Arrival date & time: 03/09/16  2242     History   Chief Complaint Chief Complaint  Patient presents with  . Medical Clearance    HPI Todd [name] is a 40 y.o. male.  Patient is a 40 year old male history of hepatitis C who presents the ED for medical clearance. Patient reports using Molly, cocaine and heroin daily for the past 6 weeks. He reports using the drugs last night with the intent of overdosing to try to kill himself. He reports after using the drugs he began to have chest pain and shortness of breath resulting in him calling his sister and coming to the ED for evaluation. Patient denies any chest pain or shortness of breath at this time. He reports feeling "tired". Denies any pain or complaints at this time. Patient also notes he drinks 6 pack of beer daily but denies having any beer last night. Patient endorses HI and reports "I just want to hurt everyone". Denies hallucinations.      Past Medical History:  Diagnosis Date  . Hepatitis C     Patient Active Problem List   Diagnosis Date Noted  . Unspecified episodic mood disorder 02/26/2013  . PTSD (post-traumatic stress disorder) 02/26/2013  . Polysubstance dependence including opioid type drug, episodic abuse ([facility]) 02/24/2013  . Alcohol abuse 02/24/2013    Past Surgical History:  Procedure Laterality Date  . TESTICLE TORSION REDUCTION         Home Medications    Prior to Admission medications   Medication Sig Start Date End Date Taking? Authorizing Provider  PROPRANOLOL HCL PO Take 1 tablet by mouth 2 (two) times daily.   Yes Historical Provider, MD  HYDROcodone-acetaminophen (NORCO/VICODIN) 5-325 MG per tablet Take 2 tablets by mouth every 4 (four) hours as needed. Patient not taking: Reported on 03/09/2016 06/23/14   Eber Hong, MD  methocarbamol (ROBAXIN) 500 MG tablet Take 1 tablet (500 mg total) by mouth 2 (two) times daily as needed for muscle  spasms. Patient not taking: Reported on 03/09/2016 06/23/14   Eber Hong, MD  naproxen (NAPROSYN) 500 MG tablet Take 1 tablet (500 mg total) by mouth 2 (two) times daily with a meal. Patient not taking: Reported on 03/09/2016 06/23/14   Eber Hong, MD    Family History No family history on file.  Social History Social History  Substance Use Topics  . Smoking status: Current Every Day Smoker    Packs/day: 1.00    Types: Cigarettes  . Smokeless tobacco: Never Used  . Alcohol use Yes     Comment: "alot"- In [place]     Allergies   Fish allergy and Penicillins   Review of Systems Review of Systems  Psychiatric/Behavioral: Positive for suicidal ideas.  All other systems reviewed and are negative.    Physical Exam Updated Vital Signs BP (!) 144/113   Pulse 70   Temp 99.3 F (37.4 C) (Oral)   Resp 19   Ht 6\' 2"  (1.88 m)   Wt 104.3 kg   SpO2 98%   BMI 29.53 kg/m   Physical Exam  Constitutional: He is oriented to person, place, and time. He appears well-developed and well-nourished. No distress.  HENT:  Head: Normocephalic and atraumatic.  Mouth/Throat: Oropharynx is clear and moist. No oropharyngeal exudate.  Eyes: Conjunctivae and EOM are normal. Right eye exhibits no discharge. Left eye exhibits no discharge. No scleral icterus.  Neck: Normal range of motion. Neck supple.  Cardiovascular:  Normal rate, regular rhythm, normal heart sounds and intact distal pulses.   Pulmonary/Chest: Effort normal and breath sounds normal. No respiratory distress. He has no wheezes. He has no rales. He exhibits no tenderness.  Abdominal: Soft. Bowel sounds are normal. He exhibits no distension and no mass. There is no tenderness. There is no rebound and no guarding. No hernia.  Musculoskeletal: Normal range of motion. He exhibits no edema.  Neurological: He is alert and oriented to person, place, and time.  Skin: Skin is warm and dry. He is not diaphoretic.  Psychiatric: His affect is  blunt. His speech is delayed. He is withdrawn. Cognition and memory are normal. He expresses inappropriate judgment. He expresses homicidal and suicidal ideation. He expresses suicidal plans.  Nursing note and vitals reviewed.    ED Treatments / Results  Labs (all labs ordered are listed, but only abnormal results are displayed) Labs Reviewed  COMPREHENSIVE METABOLIC PANEL - Abnormal; Notable for the following:       Result Value   Potassium 3.2 (*)    Glucose, Bld 112 (*)    Creatinine, Ser 1.29 (*)    AST 50 (*)    Total Bilirubin 1.6 (*)    All other components within normal limits  ACETAMINOPHEN LEVEL - Abnormal; Notable for the following:    Acetaminophen (Tylenol), Serum <10 (*)    All other components within normal limits  CBC - Abnormal; Notable for the following:    WBC 10.6 (*)    All other components within normal limits  RAPID URINE DRUG SCREEN, HOSP PERFORMED - Abnormal; Notable for the following:    Opiates POSITIVE (*)    Cocaine POSITIVE (*)    All other components within normal limits  CBG MONITORING, ED - Abnormal; Notable for the following:    Glucose-Capillary 114 (*)    All other components within normal limits  ETHANOL  SALICYLATE LEVEL    EKG  EKG Interpretation  Date/Time:  Wednesday March 09 2016 23:01:13 EDT Ventricular Rate:  91 PR Interval:    QRS Duration: 87 QT Interval:  379 QTC Calculation: 467 R Axis:   27 Text Interpretation:  Sinus rhythm No previous ECGs available Confirmed by LITTLE MD, RACHEL ([id number]) on 03/10/2016 12:02:48 AM       Radiology No results found.  Procedures Procedures (including critical care time)  Medications Ordered in ED Medications  LORazepam (ATIVAN) tablet 0-4 mg (not administered)    Followed by  LORazepam (ATIVAN) tablet 0-4 mg (not administered)  acetaminophen (TYLENOL) tablet 650 mg (not administered)  ibuprofen (ADVIL,MOTRIN) tablet 600 mg (not administered)  zolpidem (AMBIEN) tablet 5 mg  (not administered)  ondansetron (ZOFRAN) tablet 4 mg (not administered)  LORazepam (ATIVAN) injection 1 mg (1 mg Intravenous Given 03/10/16 0323)  potassium chloride SA (K-DUR,KLOR-CON) CR tablet 40 mEq (40 mEq Oral Given 03/10/16 0324)     Initial Impression / Assessment and Plan / ED Course  I have reviewed the triage vital signs and the nursing notes.  Pertinent labs & imaging results that were available during my care of the patient were reviewed by me and considered in my medical decision making (see chart for details).  Clinical Course   Patient presents for medical clearance. He reports using Molly, cocaine and heroin daily for the past few weeks and notes he tried to kill himself by overdosing last night. He reports having chest pain and shortness of breath after using drugs but denies any pain or complaints at this  time. Endorses drinking approximately 1-2 sixpacks of beer daily and notes he last drank yesterday. VSS. Exam revealed patient with blunt affect, delayed speech, withdrawn behavior and SI/HI. Remaining exam unremarkable. During my initial valuation patient denied having any chest pain or shortness of breath and states they have resolved while in the ED. EKG showed sinus rhythm without any acute ischemic changes. Potassium 3.2, patient given oral potassium supplement in the ED. UDS positive for opiates and cocaine. Remaining labs unremarkable. Patient medically cleared. Consulted TTS. Behavioral Health report patient will be reviewed by PA in the morning.  Final Clinical Impressions(s) / ED Diagnoses   Final diagnoses:  None    New Prescriptions New Prescriptions   No medications on file     Barrett Henleicole Elizabeth Jaxtyn [place]ville, PA-C 03/10/16 [id number]    Tomasita CrumbleAdeleke Oni, MD 03/10/16 [phone]

## 2016-03-10 NOTE — [facility] Suicide Risk Assessment (Signed)
[facility]   Nursing information obtained from:    Demographic factors:    Current Mental Status:    Loss Factors:    Historical Factors:    Risk Reduction Factors:     Total Time spent with patient: 30 minutes Principal Problem: Polysubstance (including opioids) dependence with physiological dependence (HCC) Diagnosis:   Patient Active Problem List   Diagnosis Date Noted  . Major depressive disorder, recurrent severe without psychotic features (HCC) [F33.2] 03/10/2016  . Polysubstance (including opioids) dependence with physiological dependence (HCC) [F19.20] 03/10/2016  . Substance induced mood disorder (HCC) [F19.94] 02/26/2013  . PTSD (post-traumatic stress disorder) [F43.10] 02/26/2013  . Polysubstance dependence including opioid type drug, episodic abuse (HCC) [F11.20, F19.20] 02/24/2013  . Alcohol abuse [F10.10] 02/24/2013   Subjective Data: Patient denies current suicidal or homicidal ideation, plan or intent.  Continued Clinical Symptoms:    The "Alcohol Use Disorders Identification Test", Guidelines for Use in Primary Care, Second Edition.  World Science writerHealth Organization [facility](WHO). Score between 0-7:  no or low risk or alcohol related problems. Score between 8-15:  moderate risk of alcohol related problems. Score between 16-19:  high risk of alcohol related problems. Score 20 or above:  warrants further diagnostic evaluation for alcohol dependence and treatment.   CLINICAL FACTORS:   Alcohol/Substance Abuse/Dependencies   Musculoskeletal: Strength & Muscle Tone: within normal limits Gait & Station: normal Patient leans: N/A  Psychiatric Specialty Exam: Physical Exam  ROS  Blood pressure 125/69, pulse 73, temperature 98.6 F (37 C), temperature source Oral, resp. rate 16, height 6' (1.829 m), weight 99.8 kg (220 lb), SpO2 97 %.Body mass index is 29.84 kg/m.     General Appearance: Casual  Eye Contact:  Good  Speech:  Clear and Coherent   Volume:  Normal  Mood:  tired  Affect:  Congruent  Thought Process:  Coherent and Goal Directed  Orientation:  Full (Time, Place, and Person)  Thought Content:  Paranoid Ideation  Suicidal Thoughts:  No  Homicidal Thoughts:  No  Memory:  Negative  Judgement:  Impaired  Insight:  Fair  Psychomotor Activity:  Normal  Concentration:  Concentration: Good and Attention Span: Good  Recall:  Good  Fund of Knowledge:  Good  Language:  Good  Akathisia:  No  Handed:  Right  AIMS (if indicated):     Assets:  Resilience  ADL's:  Intact  Cognition:  WNL  Sleep:         COGNITIVE FEATURES THAT CONTRIBUTE TO RISK:  Loss of executive function    SUICIDE RISK:   Mild:  Suicidal ideation of limited frequency, intensity, duration, and specificity.  There are no identifiable plans, no associated intent, mild dysphoria and related symptoms, good self-control (both objective and subjective assessment), few other risk factors, and identifiable protective factors, including available and accessible social support.Patient is at risk to himself chiefly from dangerous substance use that could lead to health effects or accidental OD.   PLAN OF CARE: see PAA  I certify that inpatient services furnished can reasonably be expected to improve the patient's condition.  Acquanetta SitElizabeth Woods Kayde Atkerson, MD 03/10/2016, 3:08 PM

## 2016-03-10 NOTE — ED Notes (Signed)
Report given to [place], RN at [facility]. 
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
## 2016-03-10 NOTE — Tx Team (Signed)
Initial Treatment Plan 03/10/2016 7:43 PM Todd Hanlyobert Ibe [id number]    PATIENT STRESSORS: Loss of  relationship ("my girlfriend is locked up") Substance abuse   PATIENT STRENGTHS: Ability for insight Average or above average intelligence Capable of independent living Communication skills Motivation for treatment/growth Physical Health Work skills   PATIENT IDENTIFIED PROBLEMS: "I'm lonely, I'm depressed, my girlfriend is locked up" Alteration in mood  ( Depression & Anxiety)  Infective coping skills   Polysubstance abuse  Risk for self harm               DISCHARGE CRITERIA:  Improved stabilization in mood, thinking, and/or behavior Motivation to continue treatment in a less acute level of care Need for constant or close observation no longer present Verbal commitment to aftercare and medication compliance Withdrawal symptoms are absent or subacute and managed without 24-hour nursing intervention  PRELIMINARY DISCHARGE PLAN: Outpatient therapy Placement in alternative living arrangements  PATIENT/FAMILY INVOLVEMENT: This treatment plan has been presented to and reviewed with the patient, Todd HanlyRobert [name]. The patient have been given the opportunity to ask questions and make suggestions.  Sherryl MangesWesseh, Cortana Vanderford, RN 03/10/2016, 7:43 PM

## 2016-03-10 NOTE — [facility] Group Notes (Signed)
[facility] Group Notes:  (Nursing/MHT/Case Management/Adjunct)  Date:  03/10/2016  Time:  12:20 PM  Type of Therapy:  Nurse Education  Participation Level:  Did Not Attend   [name] [name] 03/10/2016, 12:20 PM

## 2016-03-11 NOTE — Progress Notes (Signed)
Recreation Therapy Notes  Date: 03/11/16 Time: 0930 Location: 300 Hall Dayroom  Group Topic: Stress Management  Goal Area(s) Addresses:  Patient will verbalize importance of using healthy stress management.  Patient will identify positive emotions associated with healthy stress management.   Intervention: Stress Management  Activity :  [place].  LRT introduced the stress management technique of guided imagery.  LRT read a script to engage patients in the technique.  Patients were to follow along as LRT read the script.  Education:  Stress Management, Discharge Planning.   Education Outcome: Acknowledges edcuation/In group clarification offered/Needs additional education  Clinical Observations/Feedback: Pt did not attend group.     Todd [name], LRT/CTRS         Todd [name] A 03/11/2016 11:30 AM 

## 2016-03-11 NOTE — Progress Notes (Addendum)
Todd [name] presented to the med window this am after breakfast and said " can you help me". He says he feels " awful" and he is given all scheduled meds in addition to prn robaxin ( body cramps) and zofran ( nausea). He makes poor eye contact. E says this is the worst." A HE completes his daily assessment and on it he wrote he  Denied SI today and he rated his depression, hopelessness and anxiety " 5/9/9". R Safety is in place.

## 2016-03-11 NOTE — [facility] Group Notes (Signed)
[place] LCSW Group Therapy  03/11/2016 3:02 PM  Type of Therapy:  Group Therapy  Participation Level:  Did Not Attend-pt was resting in room. Invited. CSW met with pt individually after group concluded.   Summary of Progress/Problems: Feelings around Relapse. Group members discussed the meaning of relapse and shared personal stories of relapse, how it affected them and others, and how they perceived themselves during this time. Group members were encouraged to identify triggers, warning signs and coping skills used when facing the possibility of relapse. Social supports were discussed and explored in detail. Post Acute Withdrawal Syndrome (handout provided) was introduced and examined. Pt's were encouraged to ask questions, talk about key points associated with PAWS, and process this information in terms of relapse prevention.   Raffi Milstein N Smart LCSW 03/11/2016, 3:02 PM

## 2016-03-11 NOTE — Progress Notes (Signed)
Pt is a new admit to the unit earlier in the afternoon.  He was in the dayroom at the change of shift, but went to bed shortly after shift report. Pt denies SI/HI/AVH at this time.  He does report some withdrawal symptoms along with malaise.  He chose not to attend evening karaoke group.  He has been somewhat irritable, but cooperative with staff.  He was encouraged to make his needs known to staff.  Pt was medicated per orders.  Support and encouragement offered.  Discharge plans are in process.  Safety maintained with q15 minute checks.

## 2016-03-11 NOTE — Progress Notes (Signed)
NUTRITION ASSESSMENT  Pt identified as at risk on the Malnutrition Screen Tool  INTERVENTION: 1. Educated patient on the importance of nutrition and encouraged intake of food and beverages. 2. Discussed weight goals. 3. Supplements: none at this time.   NUTRITION DIAGNOSIS: Unintentional weight loss related to sub-optimal intake as evidenced by pt report.   Goal: Pt to meet >/= 90% of their estimated nutrition needs.  Monitor:  PO intake  Assessment:  Pt admitted for poly substance abuse (molly, cocaine, heroine, and 6-pack of beer daily) x6 weeks and SI. Pt reported hx of PTSD and bipolar disorder. MD note from today states that pt continues to detox and remains fatigued.   Per chart review, pt weight on date of admission was 10 lbs heavier than [facility]. Per weight hx table, he has lost 30 lbs (12% body weight) in the past 1.5 years which is not significant for time frame.   40 y.o. male  Height: Ht Readings from Last 1 Encounters:  03/10/16 6' (1.829 m)    Weight: Wt Readings from Last 1 Encounters:  03/10/16 220 lb (99.8 kg)    Weight Hx: Wt Readings from Last 10 Encounters:  03/10/16 220 lb (99.8 kg)  03/09/16 230 lb (104.3 kg)  06/23/14 250 lb (113.4 kg)  02/25/13 197 lb (89.4 kg)    BMI:  Body mass index is 29.84 kg/m. Pt meets criteria for overweight/borderline obesity based on current BMI.  Estimated Nutritional Needs: Kcal: 25-30 kcal/kg Protein: > 1 gram protein/kg Fluid: 1 ml/kcal  Diet Order: Diet Heart Room service appropriate? Yes; Fluid consistency: Thin Pt is also offered choice of unit snacks mid-morning and mid-afternoon.  Pt is eating as desired.   Lab results and medications reviewed.     Trenton GammonJessica Evelina Lore, MS, RD, LDN Inpatient Clinical Dietitian Pager # [phone] After hours/weekend pager # [phone]

## 2016-03-11 NOTE — [facility] Suicide Risk Assessment (Signed)
[facility] INPATIENT:  Family/Significant Other Suicide Prevention Education  Suicide Prevention Education:  Patient Refusal for Family/Significant Other Suicide Prevention Education: The patient [name] has refused to provide written consent for family/significant other to be provided Family/Significant Other Suicide Prevention Education during admission and/or prior to discharge.  Physician notified.  SPE completed with pt, as pt refused to consent to family contact. SPI pamphlet provided to pt and pt was encouraged to share information with support network, ask questions, and talk about any concerns relating to SPE. Pt denies access to guns/firearms and verbalized understanding of information provided. Mobile Crisis information also provided to pt.   Viraat Vanpatten N Smart LCSW 03/11/2016, 12:58 PM

## 2016-03-11 NOTE — Progress Notes (Signed)
[facility] MD Progress Note  03/11/2016 12:18 PM  Patient Active Problem List   Diagnosis Date Noted  . Major depressive disorder, recurrent severe without psychotic features ([place]) 03/10/2016  . Polysubstance (including opioids) dependence with physiological dependence ([place]) 03/10/2016  . Substance induced mood disorder ([place]) 02/26/2013  . PTSD (post-traumatic stress disorder) 02/26/2013  . Polysubstance dependence including opioid type drug, episodic abuse ([place]) 02/24/2013  . Alcohol abuse 02/24/2013    Diagnosis: Polysubstance use disorder  Subjective: Patient apparently is still quite fatigued and was resting comfortably in bed and sleeping soundly when approached. He was easily arousable and denied any current issues or any suicidal or homicidal plan or intent.  Objective: Well-developed well-nourished man resting comfortably in bed in no apparent distress speech and motor have been within normal limits not processes have been linear and goal directed although he was not very communicative at present mood is all right affect is somnolent, no current suicidal or homicidal ideation, plan or an 10 endorsed somnolent but oriented, IQ appears an average range insight and judgment are limited     Current Facility-Administered Medications (Cardiovascular):  .  cloNIDine (CATAPRES) tablet 0.1 mg **FOLLOWED BY** [START ON 03/12/2016] cloNIDine (CATAPRES) tablet 0.1 mg **FOLLOWED BY** [START ON 03/15/2016] cloNIDine (CATAPRES) tablet 0.1 mg     Current Facility-Administered Medications (Analgesics):  .  acetaminophen (TYLENOL) tablet 650 mg .  naproxen (NAPROSYN) tablet 500 mg     Current Facility-Administered Medications (Other):  .  alum & mag hydroxide-simeth (MAALOX/MYLANTA) 200-200-20 MG/5ML suspension 30 mL .  dicyclomine (BENTYL) tablet 20 mg .  hydrOXYzine (ATARAX/VISTARIL) tablet 25 mg .  lip balm (CARMEX) ointment .  loperamide (IMODIUM) capsule 2-4 mg .  LORazepam (ATIVAN)  tablet 1 mg .  LORazepam (ATIVAN) tablet 1 mg **FOLLOWED BY** LORazepam (ATIVAN) tablet 1 mg **FOLLOWED BY** [START ON 03/12/2016] LORazepam (ATIVAN) tablet 1 mg **FOLLOWED BY** [START ON 03/14/2016] LORazepam (ATIVAN) tablet 1 mg .  magnesium hydroxide (MILK OF MAGNESIA) suspension 30 mL .  methocarbamol (ROBAXIN) tablet 500 mg .  multivitamin with minerals tablet 1 tablet .  nicotine (NICODERM CQ - dosed in mg/24 hours) patch 21 mg .  ondansetron (ZOFRAN-ODT) disintegrating tablet 4 mg .  QUEtiapine (SEROQUEL) tablet 100 mg .  thiamine (VITAMIN B-1) tablet 100 mg  No current outpatient prescriptions on file.  Vital Signs:Blood pressure 109/70, pulse (!) 118, temperature 98.7 F (37.1 C), resp. rate 17, height 6' (1.829 m), weight 99.8 kg (220 lb), SpO2 97 %.    Lab Results:  Results for orders placed or performed during the hospital encounter of 03/09/16 (from the past 48 hour(s))  Rapid urine drug screen (hospital performed)     Status: Abnormal   Collection Time: 03/09/16 11:18 PM  Result Value Ref Range   Opiates POSITIVE (A) NONE DETECTED   Cocaine POSITIVE (A) NONE DETECTED   Benzodiazepines NONE DETECTED NONE DETECTED   Amphetamines NONE DETECTED NONE DETECTED   Tetrahydrocannabinol NONE DETECTED NONE DETECTED   Barbiturates NONE DETECTED NONE DETECTED    Comment:        DRUG SCREEN FOR MEDICAL PURPOSES ONLY.  IF CONFIRMATION IS NEEDED FOR ANY PURPOSE, NOTIFY LAB WITHIN 5 DAYS.        LOWEST DETECTABLE LIMITS FOR URINE DRUG SCREEN Drug Class       Cutoff (ng/mL) Amphetamine      1000 Barbiturate      200 Benzodiazepine   299 Tricyclics       242 Opiates  300 Cocaine          300 THC              50   CBG monitoring, ED     Status: Abnormal   Collection Time: 03/09/16 11:18 PM  Result Value Ref Range   Glucose-Capillary 114 (H) 65 - 99 mg/dL  Comprehensive metabolic panel     Status: Abnormal   Collection Time: 03/09/16 11:24 PM  Result Value Ref  Range   Sodium 135 135 - 145 mmol/L   Potassium 3.2 (L) 3.5 - 5.1 mmol/L   Chloride 101 101 - 111 mmol/L   CO2 24 22 - 32 mmol/L   Glucose, Bld 112 (H) 65 - 99 mg/dL   BUN 11 6 - 20 mg/dL   Creatinine, Ser 1.29 (H) 0.61 - 1.24 mg/dL   Calcium 9.2 8.9 - 10.3 mg/dL   Total Protein 7.8 6.5 - 8.1 g/dL   Albumin 4.4 3.5 - 5.0 g/dL   AST 50 (H) 15 - 41 U/L   ALT 42 17 - 63 U/L   Alkaline Phosphatase 65 38 - 126 U/L   Total Bilirubin 1.6 (H) 0.3 - 1.2 mg/dL   GFR calc non Af Amer >60 >60 mL/min   GFR calc Af Amer >60 >60 mL/min    Comment: (NOTE) The eGFR has been calculated using the CKD EPI equation. This calculation has not been validated in all clinical situations. eGFR's persistently <60 mL/min signify possible Chronic Kidney Disease.    Anion gap 10 5 - 15  Ethanol     Status: None   Collection Time: 03/09/16 11:24 PM  Result Value Ref Range   Alcohol, Ethyl (B) <5 <5 mg/dL    Comment:        LOWEST DETECTABLE LIMIT FOR SERUM ALCOHOL IS 5 mg/dL FOR MEDICAL PURPOSES ONLY   Salicylate level     Status: None   Collection Time: 03/09/16 11:24 PM  Result Value Ref Range   Salicylate Lvl <1.6 2.8 - 30.0 mg/dL  Acetaminophen level     Status: Abnormal   Collection Time: 03/09/16 11:24 PM  Result Value Ref Range   Acetaminophen (Tylenol), Serum <10 (L) 10 - 30 ug/mL    Comment:        THERAPEUTIC CONCENTRATIONS VARY SIGNIFICANTLY. A RANGE OF 10-30 ug/mL MAY BE AN EFFECTIVE CONCENTRATION FOR MANY PATIENTS. HOWEVER, SOME ARE BEST TREATED AT CONCENTRATIONS OUTSIDE THIS RANGE. ACETAMINOPHEN CONCENTRATIONS >150 ug/mL AT 4 HOURS AFTER INGESTION AND >50 ug/mL AT 12 HOURS AFTER INGESTION ARE OFTEN ASSOCIATED WITH TOXIC REACTIONS.   cbc     Status: Abnormal   Collection Time: 03/09/16 11:24 PM  Result Value Ref Range   WBC 10.6 (H) 4.0 - 10.5 K/uL   RBC 5.36 4.22 - 5.81 MIL/uL   Hemoglobin 16.5 13.0 - 17.0 g/dL   HCT 45.8 39.0 - 52.0 %   MCV 85.4 78.0 - 100.0 fL   MCH  30.8 26.0 - 34.0 pg   MCHC 36.0 30.0 - 36.0 g/dL   RDW 12.9 11.5 - 15.5 %   Platelets 276 150 - 400 K/uL    Physical Findings: AIMS: Facial and Oral Movements Muscles of Facial Expression: None, normal Lips and Perioral Area: None, normal Jaw: None, normal Tongue: None, normal,Extremity Movements Upper (arms, wrists, hands, fingers): None, normal Lower (legs, knees, ankles, toes): None, normal, Trunk Movements Neck, shoulders, hips: None, normal, Overall Severity Severity of abnormal movements (highest score from questions above): None, normal  Incapacitation due to abnormal movements: None, normal Patient's awareness of abnormal movements (rate only patient's report): No Awareness, Dental Status Current problems with teeth and/or dentures?: No Does patient usually wear dentures?: No  CIWA:  CIWA-Ar Total: 0 COWS:  COWS Total Score: 3   Assessment/Plan: Patient is detoxing without incident so far. Still remains fatigued and somnolent. We'll continue with present monitoring and treatment.  [name], MD 03/11/2016, 12:18 PM

## 2016-03-11 NOTE — Tx Team (Signed)
Interdisciplinary Treatment and Diagnostic Plan Update  03/11/2016 Time of Session: 9:30AM Todd [name] MRN: [id number]  Principal Diagnosis: Polysubstance (including opioids) dependence with physiological dependence (HCC)  Secondary Diagnoses: Principal Problem:   Polysubstance (including opioids) dependence with physiological dependence (HCC) Active Problems:   Substance induced mood disorder (HCC)   Current Medications:  Current Facility-Administered Medications  Medication Dose Route Frequency Provider Last Rate Last Dose  . acetaminophen (TYLENOL) tablet 650 mg  650 mg Oral Q4H PRN Charm Rings, NP      . alum & mag hydroxide-simeth (MAALOX/MYLANTA) 200-200-20 MG/5ML suspension 30 mL  30 mL Oral Q4H PRN Charm Rings, NP      . cloNIDine (CATAPRES) tablet 0.1 mg  0.1 mg Oral QID Acquanetta Sit, MD   0.1 mg at 03/11/16 0746   Followed by  . [START ON 03/12/2016] cloNIDine (CATAPRES) tablet 0.1 mg  0.1 mg Oral BH-qamhs Acquanetta Sit, MD       Followed by  . [START ON 03/15/2016] cloNIDine (CATAPRES) tablet 0.1 mg  0.1 mg Oral QAC breakfast Acquanetta Sit, MD      . dicyclomine (BENTYL) tablet 20 mg  20 mg Oral Q6H PRN Acquanetta Sit, MD      . hydrOXYzine (ATARAX/VISTARIL) tablet 25 mg  25 mg Oral Q6H PRN Acquanetta Sit, MD      . lip balm (CARMEX) ointment   Topical PRN Acquanetta Sit, MD      . loperamide (IMODIUM) capsule 2-4 mg  2-4 mg Oral PRN Acquanetta Sit, MD      . LORazepam (ATIVAN) tablet 1 mg  1 mg Oral Q6H PRN Acquanetta Sit, MD      . LORazepam (ATIVAN) tablet 1 mg  1 mg Oral QID Acquanetta Sit, MD   1 mg at 03/11/16 0746   Followed by  . LORazepam (ATIVAN) tablet 1 mg  1 mg Oral TID Acquanetta Sit, MD       Followed by  . [START ON 03/12/2016] LORazepam (ATIVAN) tablet 1 mg  1 mg Oral BID Acquanetta Sit, MD       Followed by  . [START ON 03/14/2016] LORazepam (ATIVAN) tablet 1 mg   1 mg Oral Daily Acquanetta Sit, MD      . magnesium hydroxide (MILK OF MAGNESIA) suspension 30 mL  30 mL Oral Daily PRN Charm Rings, NP      . methocarbamol (ROBAXIN) tablet 500 mg  500 mg Oral Q8H PRN Acquanetta Sit, MD   500 mg at 03/11/16 0746  . multivitamin with minerals tablet 1 tablet  1 tablet Oral Daily Acquanetta Sit, MD   1 tablet at 03/10/16 1342  . naproxen (NAPROSYN) tablet 500 mg  500 mg Oral BID PRN Acquanetta Sit, MD      . nicotine (NICODERM CQ - dosed in mg/24 hours) patch 21 mg  21 mg Transdermal Daily Acquanetta Sit, MD   21 mg at 03/11/16 0748  . ondansetron (ZOFRAN-ODT) disintegrating tablet 4 mg  4 mg Oral Q6H PRN Acquanetta Sit, MD   4 mg at 03/11/16 0746  . QUEtiapine (SEROQUEL) tablet 100 mg  100 mg Oral QHS Acquanetta Sit, MD   100 mg at 03/10/16 2131  . thiamine (VITAMIN B-1) tablet 100 mg  100 mg Oral Daily Acquanetta Sit, MD   100 mg at 03/11/16 2130   PTA Medications: Prescriptions Prior to Admission  Medication Sig Dispense Refill Last Dose  . methocarbamol (ROBAXIN) 500 MG tablet Take 1 tablet (500 mg total) by mouth 2 (two) times daily as needed for muscle spasms. (Patient not taking: Reported on 03/09/2016) 20 tablet 0 Not Taking at Unknown time  . naproxen (NAPROSYN) 500 MG tablet Take 1 tablet (500 mg total) by mouth 2 (two) times daily with a meal. (Patient not taking: Reported on 03/09/2016) 30 tablet 0 Not Taking at Unknown time  . PROPRANOLOL HCL PO Take 1 tablet by mouth 2 (two) times daily.   Past Month at Unknown time    Patient Stressors: Loss of  relationship ("my girlfriend is locked up") Substance abuse  Patient Strengths: Ability for insight Average or above average intelligence Capable of independent living Communication skills Motivation for treatment/growth Physical Health Work skills  Treatment Modalities: Medication Management, Group therapy, Case management,  1 to 1 session  with clinician, Psychoeducation, Recreational therapy.   Physician Treatment Plan for Primary Diagnosis: Polysubstance (including opioids) dependence with physiological dependence (HCC) Long Term Goal(s): Improvement in symptoms so as ready for discharge Improvement in symptoms so as ready for discharge   Short Term Goals: Ability to demonstrate self-control will improve Ability to maintain clinical measurements within normal limits will improve Ability to identify changes in lifestyle to reduce recurrence of condition will improve Ability to identify triggers associated with substance abuse/mental health issues will improve  Medication Management: Evaluate patient's response, side effects, and tolerance of medication regimen.  Therapeutic Interventions: 1 to 1 sessions, Unit Group sessions and Medication administration.  Evaluation of Outcomes: Progressing  Physician Treatment Plan for Secondary Diagnosis: Principal Problem:   Polysubstance (including opioids) dependence with physiological dependence (HCC) Active Problems:   Substance induced mood disorder (HCC)  Long Term Goal(s): Improvement in symptoms so as ready for discharge Improvement in symptoms so as ready for discharge   Short Term Goals: Ability to demonstrate self-control will improve Ability to maintain clinical measurements within normal limits will improve Ability to identify changes in lifestyle to reduce recurrence of condition will improve Ability to identify triggers associated with substance abuse/mental health issues will improve     Medication Management: Evaluate patient's response, side effects, and tolerance of medication regimen.  Therapeutic Interventions: 1 to 1 sessions, Unit Group sessions and Medication administration.  Evaluation of Outcomes: Progressing   RN Treatment Plan for Primary Diagnosis: Polysubstance (including opioids) dependence with physiological dependence (HCC) Long Term Goal(s):  Knowledge of disease and therapeutic regimen to maintain health will improve  Short Term Goals: Ability to remain free from injury will improve, Ability to disclose and discuss suicidal ideas and Ability to identify and develop effective coping behaviors will improve  Medication Management: RN will administer medications as ordered by provider, will assess and evaluate patient's response and provide education to patient for prescribed medication. RN will report any adverse and/or side effects to prescribing provider.  Therapeutic Interventions: 1 on 1 counseling sessions, Psychoeducation, Medication administration, Evaluate responses to treatment, Monitor vital signs and CBGs as ordered, Perform/monitor CIWA, COWS, AIMS and Fall Risk screenings as ordered, Perform wound care treatments as ordered.  Evaluation of Outcomes: Progressing   LCSW Treatment Plan for Primary Diagnosis: Polysubstance (including opioids) dependence with physiological dependence (HCC) Long Term Goal(s): Safe transition to appropriate next level of care at discharge, Engage patient in therapeutic group addressing interpersonal concerns.  Short Term Goals: Engage patient in aftercare planning with referrals and resources, Increase social support, Facilitate patient progression through stages of  change regarding substance use diagnoses and concerns and Identify triggers associated with mental health/substance abuse issues  Therapeutic Interventions: Assess for all discharge needs, 1 to 1 time with Social worker, Explore available resources and support systems, Assess for adequacy in community support network, Educate family and significant other(s) on suicide prevention, Complete Psychosocial Assessment, Interpersonal group therapy.  Evaluation of Outcomes: Progressing   Progress in Treatment: Attending groups: No.  New to unit. Continuing to assess. Participating in groups: No. Taking medication as prescribed:  Yes. Toleration medication: Yes. Family/Significant other contact made: No, will contact:  family member if pt consents. Patient understands diagnosis: Yes. Discussing patient identified problems/goals with staff: Yes. Medical problems stabilized or resolved: Yes. Denies suicidal/homicidal ideation: Yes. Issues/concerns per patient self-inventory: No. Other: n/a  New problem(s) identified: No, Describe:  n/a  New Short Term/Long Term Goal(s): medication stabilization, detox; possibly inpatient treatment from here. Pt unsure at this time.   Discharge Plan or Barriers: CSW assessing for appropriate referrals. Pt was last admitted in 2014 and was scheduled follow-up at [facility].   Reason for Continuation of Hospitalization: Anxiety Depression Medication stabilization Withdrawal symptoms  Estimated Length of Stay: 2-4 days   Attendees: Patient: 03/11/2016 8:04 AM  Physician: Dr. Mckinley Jewel MD 03/11/2016 8:04 AM  Nursing: Sharalyn Ink RN 03/11/2016 8:04 AM  RN Care Manager: Onnie Boer CM 03/11/2016 8:04 AM  Social Worker: Chartered loss adjuster, LCSW 03/11/2016 8:04 AM  Recreational Therapist:  03/11/2016 8:04 AM  Other:  03/11/2016 8:04 AM  Other:  03/11/2016 8:04 AM  Other: 03/11/2016 8:04 AM    Scribe for Treatment Team: Ledell Peoples Smart, LCSW 03/11/2016 8:04 AM

## 2016-03-12 DIAGNOSIS — F17210 Nicotine dependence, cigarettes, uncomplicated: Secondary | ICD-10-CM

## 2016-03-12 DIAGNOSIS — Z79899 Other long term (current) drug therapy: Secondary | ICD-10-CM

## 2016-03-12 DIAGNOSIS — F1920 Other psychoactive substance dependence, uncomplicated: Principal | ICD-10-CM

## 2016-03-12 MED ORDER — QUETIAPINE FUMARATE 200 MG PO TABS
200.0000 mg | ORAL_TABLET | Freq: Every day | ORAL | Status: DC
  Administered 2016-03-12 – 2016-03-13 (×2): 200 mg via ORAL
  Filled 2016-03-12 (×4): qty 1

## 2016-03-12 MED ORDER — SERTRALINE HCL 25 MG PO TABS
25.0000 mg | ORAL_TABLET | Freq: Every day | ORAL | Status: DC
  Administered 2016-03-13 – 2016-03-14 (×2): 25 mg via ORAL
  Filled 2016-03-12 (×4): qty 1

## 2016-03-12 NOTE — [facility] Group Notes (Signed)
  [facility] LCSW Group Therapy Note  03/12/2016  and  10:00 AM  Type of Therapy and Topic:  Group Therapy: Avoiding Self-Sabotaging and Enabling Behaviors  Participation Level:  Did Not Attend; invited to participate yet did not despite overhead announcement and encouragement by staff    Catherine C Harrill, LCSW 

## 2016-03-12 NOTE — [facility] Group Notes (Signed)
Identifying Needs  Date:  03/12/2016  Time:  0915  Type of Therapy:  Nurse Education   :  The group focuses on teaching  Patients how to identify their needs as a coping skill.   Participation Level:  Did Not Attend    Participation Quality: N/A   Affect:  N/A  Cognitive:  N/A  Insight:N/A    Engagement in Group:  N/A  Modes of Intervention:  N/A  Summary of Progress/Problems:N/A  Rich BraveDuke, Salene Mohamud Lynn 03/12/2016, 11:21 AM

## 2016-03-12 NOTE — Progress Notes (Addendum)
Patient ID: Francena HanlyRobert Kass, male   DOB: 09/23/1975, 40 y.o.   MRN: [id number]   Pt currently presents with an animated affect and restless behavior. Pt arrogant during interaction possibly due to increased anxiety and cravings. Pt reports to writer that their goal is to "get through the day." Pt states "[facility], if not [place] [facility] [facility]." Pt reports good sleep with current medication regimen.   Pt provided with medications per providers orders. Pt's labs and vitals were monitored throughout the night, pt trending hypertensive. Reports that he used to take propanolol but cannot remember the dosage. Pt supported emotionally and encouraged to express concerns and questions. Pt educated on medications.  Pt's safety ensured with 15 minute and environmental checks. Pt currently denies SI/HI and A/V hallucinations. Pt verbally agrees to seek staff if SI/HI or A/VH occurs and to consult with staff before acting on any harmful thoughts. Will continue POC.

## 2016-03-12 NOTE — Progress Notes (Signed)
Pt has been up this evening out in the milieu interacting with other patients.  He is not as irritable as when he was admitted yesterday.  He reports that his withdrawal symptoms are still moderate, and he is medicated per the Ativan protocol for his symptoms.  Pt denies SI/HI/AVh at this time.  He makes his needs known to staff.  He has been polite and cooperative with staff. Support and encouragement offered.  Discharge plans are in process.  He is considering long term treatment.  Safety maintained with q15 minute checks.

## 2016-03-12 NOTE — [facility] Group Notes (Signed)
[facility] Group Notes:  (Nursing/MHT/Case Management/Adjunct)  Date:  03/12/2016  Time:  5:40 PM  Type of Therapy:  Nurse Education  Participation Level:  Active  Participation Quality:  Intrusive  Affect:  Defensive  Cognitive:  Appropriate  Insight:  Lacking  Engagement in Group:  Lacking and Off Topic  Modes of Intervention:  Education and Problem-solving  Summary of Progress/Problems: patient made inappropriate comments through out group. He spent time discussing the size of his penis and was difficult to redirect and then kept coming back top this topic. He was able to identify five positive personal traits.  [name] Agustine Rossitto 03/12/2016, 5:40 PM

## 2016-03-12 NOTE — Progress Notes (Signed)
[facility] MD Progress Note  03/12/2016 12:44 PM Todd [name]  MRN:  [id number] Subjective:   Patient seen, chart reviewed and case discussed with nursing staff. Patient states that he feels good today. He states that he came here after overdosing Molly, feeling overwhelmed that his "life is falling apart." He relapsed on substance use after 2.5 years of sobriety in the setting of relationship issues. He wishes to go to [facility]. He wants his medication to be back on his home dose, a s quetiapine has been helping for his irritability. He denies insomnia. he denies SI.  Principal Problem: Polysubstance (including opioids) dependence with physiological dependence (HCC) Diagnosis:   Patient Active Problem List   Diagnosis Date Noted  . Major depressive disorder, recurrent severe without psychotic features (HCC) [F33.2] 03/10/2016  . Polysubstance (including opioids) dependence with physiological dependence (HCC) [F19.20] 03/10/2016  . Substance induced mood disorder (HCC) [F19.94] 02/26/2013  . PTSD (post-traumatic stress disorder) [F43.10] 02/26/2013  . Polysubstance dependence including opioid type drug, episodic abuse (HCC) [F11.20, F19.20] 02/24/2013  . Alcohol abuse [F10.10] 02/24/2013   Total Time spent with patient: 20 minutes  Past Psychiatric History: see HPI  Past Medical History:  Past Medical History:  Diagnosis Date  . Hepatitis C     Past Surgical History:  Procedure Laterality Date  . TESTICLE TORSION REDUCTION     Family History: History reviewed. No pertinent family history. Family Psychiatric  History: see HPI Social History:  History  Alcohol Use  . Yes    Comment: "alot"- In [facility]     History  Drug Use  . Types: Cocaine, Methamphetamines    Comment: pt states "all of them"    Social History   Social History  . Marital status: Single    Spouse name: N/A  . Number of children: N/A  . Years of education: N/A   Social History Main Topics  . Smoking status:  Current Every Day Smoker    Packs/day: 1.00    Types: Cigarettes  . Smokeless tobacco: Never Used  . Alcohol use Yes     Comment: "alot"- In [facility]  . Drug use:     Types: Cocaine, Methamphetamines     Comment: pt states "all of them"  . Sexual activity: Not Asked   Other Topics Concern  . None   Social History Narrative  . None   Additional Social History:                         Sleep: Good  Appetite:  Good  Current Medications: Current Facility-Administered Medications  Medication Dose Route Frequency Provider Last Rate Last Dose  . acetaminophen (TYLENOL) tablet 650 mg  650 mg Oral Q4H PRN Charm Rings, NP      . alum & mag hydroxide-simeth (MAALOX/MYLANTA) 200-200-20 MG/5ML suspension 30 mL  30 mL Oral Q4H PRN Charm Rings, NP      . cloNIDine (CATAPRES) tablet 0.1 mg  0.1 mg Oral BH-qamhs Acquanetta Sit, MD   0.1 mg at 03/12/16 0804   Followed by  . [START ON 03/15/2016] cloNIDine (CATAPRES) tablet 0.1 mg  0.1 mg Oral QAC breakfast Acquanetta Sit, MD      . dicyclomine (BENTYL) tablet 20 mg  20 mg Oral Q6H PRN Acquanetta Sit, MD      . hydrOXYzine (ATARAX/VISTARIL) tablet 25 mg  25 mg Oral Q6H PRN Acquanetta Sit, MD   25 mg at 03/11/16 2150  .  lip balm (CARMEX) ointment   Topical PRN Acquanetta SitElizabeth Woods Oates, MD      . loperamide (IMODIUM) capsule 2-4 mg  2-4 mg Oral PRN Acquanetta SitElizabeth Woods Oates, MD      . LORazepam (ATIVAN) tablet 1 mg  1 mg Oral Q6H PRN Acquanetta SitElizabeth Woods Oates, MD   1 mg at 03/11/16 2153  . LORazepam (ATIVAN) tablet 1 mg  1 mg Oral BID Acquanetta SitElizabeth Woods Oates, MD       Followed by  . [START ON 03/14/2016] LORazepam (ATIVAN) tablet 1 mg  1 mg Oral Daily Acquanetta SitElizabeth Woods Oates, MD      . magnesium hydroxide (MILK OF MAGNESIA) suspension 30 mL  30 mL Oral Daily PRN Charm RingsJamison Y Lord, NP      . methocarbamol (ROBAXIN) tablet 500 mg  500 mg Oral Q8H PRN Acquanetta SitElizabeth Woods Oates, MD   500 mg at 03/11/16 0746  . multivitamin with  minerals tablet 1 tablet  1 tablet Oral Daily Acquanetta SitElizabeth Woods Oates, MD   1 tablet at 03/12/16 [phone]  . naproxen (NAPROSYN) tablet 500 mg  500 mg Oral BID PRN Acquanetta SitElizabeth Woods Oates, MD      . nicotine (NICODERM CQ - dosed in mg/24 hours) patch 21 mg  21 mg Transdermal Daily Acquanetta SitElizabeth Woods Oates, MD   21 mg at 03/12/16 0805  . ondansetron (ZOFRAN-ODT) disintegrating tablet 4 mg  4 mg Oral Q6H PRN Acquanetta SitElizabeth Woods Oates, MD   4 mg at 03/11/16 0746  . QUEtiapine (SEROQUEL) tablet 100 mg  100 mg Oral QHS Acquanetta SitElizabeth Woods Oates, MD   100 mg at 03/11/16 2150  . thiamine (VITAMIN B-1) tablet 100 mg  100 mg Oral Daily Acquanetta SitElizabeth Woods Oates, MD   100 mg at 03/12/16 [id number]    Lab Results: No results found for this or any previous visit (from the past 48 hour(s)).  Blood Alcohol level:  Lab Results  Component Value Date   ETH <5 03/09/2016   ETH <11 02/24/2013    Metabolic Disorder Labs: No results found for: HGBA1C, MPG No results found for: PROLACTIN No results found for: CHOL, TRIG, HDL, CHOLHDL, VLDL, LDLCALC  Physical Findings: AIMS: Facial and Oral Movements Muscles of Facial Expression: None, normal Lips and Perioral Area: None, normal Jaw: None, normal Tongue: None, normal,Extremity Movements Upper (arms, wrists, hands, fingers): None, normal Lower (legs, knees, ankles, toes): None, normal, Trunk Movements Neck, shoulders, hips: None, normal, Overall Severity Severity of abnormal movements (highest score from questions above): None, normal Incapacitation due to abnormal movements: None, normal Patient's awareness of abnormal movements (rate only patient's report): No Awareness, Dental Status Current problems with teeth and/or dentures?: No Does patient usually wear dentures?: No  CIWA:  CIWA-Ar Total: 0 COWS:  COWS Total Score: 10  Musculoskeletal: Strength & Muscle Tone: within normal limits Gait & Station: normal Patient leans: N/A  Psychiatric Specialty Exam: Physical Exam   Review of Systems  Cardiovascular: Negative for chest pain and palpitations.  Neurological: Negative for dizziness and tremors.  Psychiatric/Behavioral: Positive for depression. Negative for hallucinations, substance abuse and suicidal ideas. The patient is nervous/anxious. The patient does not have insomnia.   All other systems reviewed and are negative.   Blood pressure (!) 142/91, pulse 84, temperature 99.1 F (37.3 C), temperature source Oral, resp. rate 16, height 6' (1.829 m), weight 220 lb (99.8 kg), SpO2 97 %.Body mass index is 29.84 kg/m.  General Appearance: Fairly Groomed  Eye Contact:  Good  Speech:  Clear and  Coherent  Volume:  Normal  Mood:  "better"  Affect:  Restricted  Thought Process:  Coherent and Goal Directed  Orientation:  Full (Time, Place, and Person)  Thought Content:  Logical Perceptions: denies AH/VH  Suicidal Thoughts:  No  Homicidal Thoughts:  No  Memory:  Immediate;   Good Recent;   Good Remote;   Good  Judgement:  Fair  Insight:  Fair  Psychomotor Activity:  Normal  Concentration:  Concentration: Fair and Attention Span: Fair  Recall:  [employer]Fair  Fund of Knowledge:  Fair  Language:  Fair  Akathisia:  No  Handed:  Right  AIMS (if indicated):     Assets:  Communication Skills Desire for Improvement  ADL's:  Intact  Cognition:  WNL  Sleep:  Number of Hours: 6.75   Assessment Todd [name] is a 40 year old male with cocaine, use, alcohol use, MDMA use disorder, substance induced mood disorder, Hep C, chronic pain who presented after suicide attempt of overdosing on MDMA.  # MDD # r/o substance induced mood disorder Patient endorses mild neurovegetative symptoms. Will start sertraline to target his mood symptoms. Will increase quetiapine to his home dose as adjunctive treatment for his depression.   # MDMA/cocaine/alcohol use disorder Patient is motivated for sobriety and [facility] Will liaise with Child psychotherapistsocial worker for  appropriate disposition. Will continue clonidine and ativan protocol.  Plan - Start sertraline 25 mg daily - Increase quetiapine 200 mg qhs - Continue clonidine, ativan protocol  Treatment Plan Summary: Daily contact with patient to assess and evaluate symptoms and progress in treatment  Neysa Hottereina Zykiria Bruening, MD 03/12/2016, 12:44 PM

## 2016-03-12 NOTE — Progress Notes (Addendum)
D Jahari is OOB UAL at change of shift this morning. Writer notified by night shift MHT that patient was overheard making verbal threat to 2 different patients on the [address] this morning during breakfast. Writer asked pot to speak with Probation officer privately ( in old group room on [address]) and Probation officer told pt that MHT reproted he made verbal threats to 2 different patients. Pt said " why do people tell lies on me....what did they say....they didn't tell you ALL of what I said" Writer asked pt if he siad these statents and he said " what do you think.Marland KitchenMarland KitchenI think you people need to get your staory straight.. This really pisses me off....come in here and people start all kids of mess with you.Marland Kitchenibuprofen didn't say nothin to those people.Marland Kitchenibuprofen can say what I want to whoever I want to .Marland Kitchen I  know who made that accusation....you don't need to tell me". Writer reinforced to pt that Probation officer appreciated pt's willingness to speak with Probation officer, to have the conversation and to work with Probation officer to keep all patients safe. Pt cont to deny making any threats against any patient and said " that's how ya'll do me..ibuprofen'm going bck to sleep". Writer encouraged pt to come to writer ASAP if pt felt like he was unsafe and / or unable to control himslef.  Addendum 1200: Pt presented to this nurse and said " I want to go home..when can I go home...when can I get discharged from here?" . Writer spoke with physician, who states " I will speak with him". Patient notified and said " well you better get somebody over here to talk to me because I don't want to be here any more and I'm ready to go. " Social work and MD made aware of [t's behavior. He met with Dr. Modesta Messing and has been calmer, quieter and in control the remainder of the shift. MD's note reports pt wants to go to [facility]. Safety in place.

## 2016-03-13 MED ORDER — PROPRANOLOL HCL 20 MG PO TABS
20.0000 mg | ORAL_TABLET | Freq: Two times a day (BID) | ORAL | Status: DC
  Administered 2016-03-13 – 2016-03-14 (×2): 20 mg via ORAL
  Filled 2016-03-13 (×4): qty 1

## 2016-03-13 MED ORDER — PROPRANOLOL HCL 10 MG PO TABS
ORAL_TABLET | ORAL | Status: AC
  Administered 2016-03-13: 10 mg
  Filled 2016-03-13: qty 1

## 2016-03-13 NOTE — Progress Notes (Signed)
[name] is irritable, not engaged and disinterested. He has spent much of his time today..sleeping in his room. AA HE did complete his daily assessment and on it he wrote he deneid SI today and he rated his depression, hopelessness and anxetiy " 8/8/9", respectively.R He got agitated again because he demanded to be discharged, although he is clearly not ready ( his BP was 152/103 at 1700). He is started on 20 mg inderal bid and he is educated about this and in agreement. Safety in place,.

## 2016-03-13 NOTE — Progress Notes (Signed)
[facility] MD Progress Note  03/13/2016 5:37 PM Todd [name]  MRN:  [id number] Subjective:   Patient seen, chart reviewed and case discussed with nursing staff.  Patient states that he was upset this morning due to insomnia. (He was loud and declined to be interviewed when this writer visited his room) he is concerned about his blood pressure. He is interested in going to [facility]. He denies SI.   Principal Problem: Polysubstance (including opioids) dependence with physiological dependence (HCC) Diagnosis:   Patient Active Problem List   Diagnosis Date Noted  . Major depressive disorder, recurrent severe without psychotic features (HCC) [F33.2] 03/10/2016  . Polysubstance (including opioids) dependence with physiological dependence (HCC) [F19.20] 03/10/2016  . Substance induced mood disorder (HCC) [F19.94] 02/26/2013  . PTSD (post-traumatic stress disorder) [F43.10] 02/26/2013  . Polysubstance dependence including opioid type drug, episodic abuse (HCC) [F11.20, F19.20] 02/24/2013  . Alcohol abuse [F10.10] 02/24/2013   Total Time spent with patient: 20 minutes  Past Psychiatric History: see HPI  Past Medical History:  Past Medical History:  Diagnosis Date  . Hepatitis C     Past Surgical History:  Procedure Laterality Date  . TESTICLE TORSION REDUCTION     Family History: History reviewed. No pertinent family history. Family Psychiatric  History: see HPI Social History:  History  Alcohol Use  . Yes    Comment: "alot"- In [facility]     History  Drug Use  . Types: Cocaine, Methamphetamines    Comment: pt states "all of them"    Social History   Social History  . Marital status: Single    Spouse name: N/A  . Number of children: N/A  . Years of education: N/A   Social History Main Topics  . Smoking status: Current Every Day Smoker    Packs/day: 1.00    Types: Cigarettes  . Smokeless tobacco: Never Used  . Alcohol use Yes     Comment: "alot"- In [facility]  . Drug use:    Types: Cocaine, Methamphetamines     Comment: pt states "all of them"  . Sexual activity: Not Asked   Other Topics Concern  . None   Social History Narrative  . None   Additional Social History:                         Sleep: Good  Appetite:  Good  Current Medications: Current Facility-Administered Medications  Medication Dose Route Frequency Provider Last Rate Last Dose  . acetaminophen (TYLENOL) tablet 650 mg  650 mg Oral Q4H PRN Charm Rings, NP   650 mg at 03/13/16 1729  . alum & mag hydroxide-simeth (MAALOX/MYLANTA) 200-200-20 MG/5ML suspension 30 mL  30 mL Oral Q4H PRN Charm Rings, NP      . cloNIDine (CATAPRES) tablet 0.1 mg  0.1 mg Oral BH-qamhs Acquanetta Sit, MD   0.1 mg at 03/13/16 7829   Followed by  . [START ON 03/15/2016] cloNIDine (CATAPRES) tablet 0.1 mg  0.1 mg Oral QAC breakfast Acquanetta Sit, MD      . dicyclomine (BENTYL) tablet 20 mg  20 mg Oral Q6H PRN Acquanetta Sit, MD      . hydrOXYzine (ATARAX/VISTARIL) tablet 25 mg  25 mg Oral Q6H PRN Acquanetta Sit, MD   25 mg at 03/13/16 1729  . lip balm (CARMEX) ointment   Topical PRN Acquanetta Sit, MD      . loperamide (IMODIUM) capsule 2-4 mg  2-4  mg Oral PRN Acquanetta SitElizabeth Woods Oates, MD      . Melene Muller[START ON 03/14/2016] LORazepam (ATIVAN) tablet 1 mg  1 mg Oral Daily Acquanetta SitElizabeth Woods Oates, MD      . magnesium hydroxide (MILK OF MAGNESIA) suspension 30 mL  30 mL Oral Daily PRN Charm RingsJamison Y Lord, NP      . methocarbamol (ROBAXIN) tablet 500 mg  500 mg Oral Q8H PRN Acquanetta SitElizabeth Woods Oates, MD   500 mg at 03/11/16 0746  . multivitamin with minerals tablet 1 tablet  1 tablet Oral Daily Acquanetta SitElizabeth Woods Oates, MD   1 tablet at 03/13/16 [id number]  . naproxen (NAPROSYN) tablet 500 mg  500 mg Oral BID PRN Acquanetta SitElizabeth Woods Oates, MD      . nicotine (NICODERM CQ - dosed in mg/24 hours) patch 21 mg  21 mg Transdermal Daily Acquanetta SitElizabeth Woods Oates, MD   21 mg at 03/13/16 0925  . ondansetron  (ZOFRAN-ODT) disintegrating tablet 4 mg  4 mg Oral Q6H PRN Acquanetta SitElizabeth Woods Oates, MD   4 mg at 03/11/16 0746  . propranolol (INDERAL) tablet 20 mg  20 mg Oral BID Neysa Hottereina Rylinn Linzy, MD      . QUEtiapine (SEROQUEL) tablet 200 mg  200 mg Oral QHS Neysa Hottereina Konstantinos Cordoba, MD   200 mg at 03/12/16 2141  . sertraline (ZOLOFT) tablet 25 mg  25 mg Oral Daily Neysa Hottereina Bilan Tedesco, MD   25 mg at 03/13/16 0926  . thiamine (VITAMIN B-1) tablet 100 mg  100 mg Oral Daily Acquanetta SitElizabeth Woods Oates, MD   100 mg at 03/13/16 [id number]    Lab Results: No results found for this or any previous visit (from the past 48 hour(s)).  Blood Alcohol level:  Lab Results  Component Value Date   ETH <5 03/09/2016   ETH <11 02/24/2013    Metabolic Disorder Labs: No results found for: HGBA1C, MPG No results found for: PROLACTIN No results found for: CHOL, TRIG, HDL, CHOLHDL, VLDL, LDLCALC  Physical Findings: AIMS: Facial and Oral Movements Muscles of Facial Expression: None, normal Lips and Perioral Area: None, normal Jaw: None, normal Tongue: None, normal,Extremity Movements Upper (arms, wrists, hands, fingers): None, normal Lower (legs, knees, ankles, toes): None, normal, Trunk Movements Neck, shoulders, hips: None, normal, Overall Severity Severity of abnormal movements (highest score from questions above): None, normal Incapacitation due to abnormal movements: None, normal Patient's awareness of abnormal movements (rate only patient's report): No Awareness, Dental Status Current problems with teeth and/or dentures?: No Does patient usually wear dentures?: No  CIWA:  CIWA-Ar Total: 6 COWS:  COWS Total Score: 0  Musculoskeletal: Strength & Muscle Tone: within normal limits Gait & Station: normal Patient leans: N/A  Psychiatric Specialty Exam: Physical Exam  Review of Systems  Cardiovascular: Negative for chest pain and palpitations.  Neurological: Positive for headaches. Negative for dizziness and tremors.  Psychiatric/Behavioral:  Positive for depression. Negative for hallucinations, substance abuse and suicidal ideas. The patient is nervous/anxious. The patient does not have insomnia.   All other systems reviewed and are negative.   Blood pressure (!) 149/99, pulse 98, temperature 98.9 F (37.2 C), temperature source Oral, resp. rate 18, height 6' (1.829 m), weight 220 lb (99.8 kg), SpO2 97 %.Body mass index is 29.84 kg/m.  General Appearance: Fairly Groomed  Eye Contact:  Good  Speech:  Clear and Coherent  Volume:  Normal  Mood:  "fine now"  Affect:  Restricted  Thought Process:  Coherent and Goal Directed  Orientation:  Full (Time, Place, and Person)  Thought Content:  Logical Perceptions: denies AH/VH  Suicidal Thoughts:  No  Homicidal Thoughts:  No  Memory:  Immediate;   Good Recent;   Good Remote;   Good  Judgement:  Fair  Insight:  Fair  Psychomotor Activity:  Normal  Concentration:  Concentration: Fair and Attention Span: Fair  Recall:  [employer]Fair  Fund of Knowledge:  Fair  Language:  Fair  Akathisia:  No  Handed:  Right  AIMS (if indicated):     Assets:  Communication Skills Desire for Improvement  ADL's:  Intact  Cognition:  WNL  Sleep:  Number of Hours: 6.25   Assessment Todd [name] is a 40 year old male with cocaine, use, alcohol use, MDMA use disorder, substance induced mood disorder, Hep C, chronic pain who presented after suicide attempt of overdosing on MDMA.  # MDD # r/o substance induced mood disorder Patient continues to endorses mild neurovegetative symptoms. Will continue sertraline to target his mood symptoms and quetiapine as adjunctive treatment for his depression.   # MDMA/cocaine/alcohol use disorder Patient is motivated for sobriety and [facility]. Will liaise with Child psychotherapistsocial worker for appropriate disposition. Will continue clonidine and ativan protocol.  # HTN Start propranolol, home meds for hypertension  Plan - Continue sertraline 25 mg daily -  Continue quetiapine 200 mg qhs - Continue clonidine, ativan protocol - Propranolol 20 mg BID for hypertension  Treatment Plan Summary: Daily contact with patient to assess and evaluate symptoms and progress in treatment  Neysa Hottereina Tyson Masin, MD 03/13/2016, 5:37 PM
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
## 2016-03-13 NOTE — Progress Notes (Signed)
D: Pt denies SI/HI/AVH. Pt is pleasant and cooperative. Pt seen interacting with peers in the dayroom this evening. Pt forwards little information, but did listen to writer when explained that if he did not work on his Bp ( modify his lifestyle with his foods and exercise) he may have a cardiovascular event in the future. Pt seemed to listen and appeared to want to make a change.   A: Pt was offered support and encouragement. Pt was given scheduled medications. Pt was encourage to attend groups. Q 15 minute checks were done for safety.   R:Pt attends groups and interacts well with peers and staff. Pt is taking medication. Pt has no complaints at this time .Pt receptive to treatment and safety maintained on unit.

## 2016-03-14 MED ORDER — SERTRALINE HCL 25 MG PO TABS: 25.0000 mg | ORAL_TABLET | Freq: Every day | ORAL | 0 refills | Status: DC

## 2016-03-14 MED ORDER — PROPRANOLOL HCL 20 MG PO TABS: 20.0000 mg | ORAL_TABLET | Freq: Two times a day (BID) | ORAL | 0 refills | Status: DC

## 2016-03-14 MED ORDER — HYDROXYZINE HCL 25 MG PO TABS: 25.0000 mg | ORAL_TABLET | Freq: Four times a day (QID) | ORAL | 0 refills | Status: DC | PRN

## 2016-03-14 MED ORDER — QUETIAPINE FUMARATE 200 MG PO TABS: 200.0000 mg | ORAL_TABLET | Freq: Every day | ORAL | 0 refills | Status: DC

## 2016-03-14 MED ORDER — NICOTINE 21 MG/24HR TD PT24: 21.0000 mg | MEDICATED_PATCH | Freq: Every day | TRANSDERMAL | 0 refills | Status: DC

## 2016-03-14 MED ORDER — THIAMINE HCL 100 MG PO TABS: 100.0000 mg | ORAL_TABLET | Freq: Every day | ORAL | 0 refills | Status: DC

## 2016-03-14 NOTE — [facility] Group Notes (Signed)
Pt attended spiritual care group on grief and loss facilitated by chaplain Todd [name]   Group opened with brief discussion and psycho-social ed around grief and loss in relationships and in relation to self - identifying life patterns, circumstances, changes that cause losses. Established group norm of speaking from own life experience. Group goal of establishing open and affirming space for members to share loss and experience with grief, normalize grief experience and provide psycho social education and grief support.    Todd [name] attended group until time for him to discharge.  [facility] and stated [facility].    [name], [name] Mdiv

## 2016-03-14 NOTE — Progress Notes (Signed)
Recreation Therapy Notes  Date: 03/14/16 Time: 0930 Location: [address] Dayroom  Group Topic: Stress Management  Goal Area(s) Addresses:  Patient will verbalize importance of using healthy stress management.  Patient will identify positive emotions associated with healthy stress management.   Behavioral Response: Engaged  Intervention: Calm App  Activity :  Body Scan Meditation.  LRT introduced the stress management technique of meditation.  LRT played a body scan meditation from the Calm app to allow the patients to engage in the activity.  Patients were to follow along as the meditation was being played.  Education:  Stress Management, Discharge Planning.   Education Outcome: Acknowledges edcuation/In group clarification offered/Needs additional education  Clinical Observations/Feedback: Pt attended group.   Caroll RancherMarjette Darrnell Mangiaracina, LRT/CTRS         Caroll RancherLindsay, Madisun Hargrove A 03/14/2016 12:11 PM

## 2016-03-14 NOTE — Progress Notes (Signed)
Discharge note:  Patient discharged per MD order.  Patient received all personal belongings from locker and unit.  Reviewed AVS/transitiion record with patient and he indicated understanding.  He denies any thoughts of self harm.  Patient received prescriptions and medication samples.  Patient left ambulatory with [facility] representative.
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
## 2016-03-14 NOTE — Progress Notes (Signed)
  [facility] :  Will you be returning to the same living situation after discharge:  No-[facility]  At discharge, do you have transportation home?: Yes,  [facility] driver will pick up pt this afternoon Do you have the ability to pay for your medications: Yes,  mental health  Release of information consent forms completed and submitted to medical records by CSW.  Patient to Follow up at: Follow-up Information    [facility] .   Specialty:  Behavioral Health Why:  Walk in between 8am-9am Monday through Friday for hospital follow-up/medication management/assessment for counseling and substance abuse services. Thank you.  Contact information: [address] [place] [address] [phone]        [facility] .   Why:  Referral faxed: 10/27. Please call [facility] if you plan to pursue treatment at this facility after completing [facility]'s program. Thank you.  Contact information: [address] [place] [address] [phone]        [facility] Follow up on 03/14/2016.   Why:  You have been accepted for admission today, 03/14/16 to [facility]. [facility] driver will pick you up today for transport.  Contact information: [address] [place], [place] [address] Phone: [phone] Fax: [phone]          Next level of care provider has access to [facility] Link:no  Safety Planning and Suicide Prevention discussed: Yes,  SPE completed with pt; pt declined to consent to family contact. SPI pamphlet and mobile crisis information provided to pt; he was encouraged to share this information with support network.  Have you used any form of tobacco in the last 30 days? (Cigarettes, Smokeless Tobacco, Cigars, and/or Pipes): Yes  Has patient been referred to the Quitline?: Patient refused referral  Patient has been referred for addiction treatment: Yes  Trivia Heffelfinger N Smart LCSW 03/14/2016, 8:39 AM

## 2016-03-14 NOTE — [facility] Suicide Risk Assessment (Signed)
[facility]   Principal Problem: Polysubstance (including opioids) dependence with physiological dependence [facility](HCC) Discharge Diagnoses:  Patient Active Problem List   Diagnosis Date Noted  . Major depressive disorder, recurrent severe without psychotic features (HCC) [F33.2] 03/10/2016  . Polysubstance (including opioids) dependence with physiological dependence (HCC) [F19.20] 03/10/2016  . Substance induced mood disorder (HCC) [F19.94] 02/26/2013  . PTSD (post-traumatic stress disorder) [F43.10] 02/26/2013  . Polysubstance dependence including opioid type drug, episodic abuse (HCC) [F11.20, F19.20] 02/24/2013  . Alcohol abuse [F10.10] 02/24/2013    Total Time spent with patient: 15 minutes  Musculoskeletal: Strength & Muscle Tone: within normal limits Gait & Station: normal Patient leans: N/A  Psychiatric Specialty Exam: ROS  Blood pressure (!) 135/92, pulse 86, temperature 98.6 F (37 C), temperature source Oral, resp. rate 18, height 6' (1.829 m), weight 99.8 kg (220 lb), SpO2 97 %.Body mass index is 29.84 kg/m.  General Appearance: Fairly Groomed  Patent attorneyye Contact::  Good  Speech:  Clear and Coherent  Volume:  Normal  Mood:  Euthymic  Affect:  Congruent  Thought Process:  Goal Directed  Orientation:  Full (Time, Place, and Person)  Thought Content:  Negative  Suicidal Thoughts:  No  Homicidal Thoughts:  No  Memory:  Negative  Judgement:  Fair  Insight:  Fair  Psychomotor Activity:  Normal  Concentration:  Good  Recall:  Good  Fund of Knowledge:Good  Language: Good  Akathisia:  No  Handed:  Right  AIMS (if indicated):     Assets:  Resilience  Sleep:  Number of Hours: 6.25  Cognition: WNL  ADL's:  Intact   Mental Status Per Nursing Assessment::   On Admission:     Demographic Factors:  Male and Low socioeconomic status  Loss Factors: Financial problems/change in socioeconomic status  Historical Factors: Impulsivity  Risk  Reduction Factors:   Positive coping skills or problem solving skills  Continued Clinical Symptoms:  Alcohol/Substance Abuse/Dependencies  Cognitive Features That Contribute To Risk:  None    Suicide Risk:  Mild:  Suicidal ideation of limited frequency, intensity, duration, and specificity.  There are no identifiable plans, no associated intent, mild dysphoria and related symptoms, good self-control (both objective and subjective assessment), few other risk factors, and identifiable protective factors, including available and accessible social support.  Follow-up Information    [facility] .   Specialty:  Behavioral Health Why:  Walk in between 8am-9am Monday through Friday for hospital follow-up/medication management/assessment for counseling and substance abuse services. Thank you.  Contact information: [address] [place] [address] [phone]        [facility] .   Why:  Referral faxed: 10/27 Contact information: [address] [place] [address] [phone]        [facility] .   Why:  Referral made: 03/11/16 Contact information: [address] [place], [place] [address] Phone: [phone] Fax: [phone]          Plan Of Care/Follow-up recommendations:  Other:  Pt denies current acute suicidal or homicidal ideation, plan or intent. Plan is to go to program after discharge for further substance use treatment.  Acquanetta SitElizabeth Woods Oates, MD 03/14/2016, 8:28 AM

## 2016-03-14 NOTE — Tx Team (Signed)
Interdisciplinary Treatment and Diagnostic Plan Update  03/14/2016 Time of Session: 9:30AM Todd [name] MRN: [id number]  Principal Diagnosis: Polysubstance (including opioids) dependence with physiological dependence ([name])  Secondary Diagnoses: Principal Problem:   Polysubstance (including opioids) dependence with physiological dependence ([name]) Active Problems:   Substance induced mood disorder ([name])   Current Medications:  Current Facility-Administered Medications  Medication Dose Route Frequency Provider Last Rate Last Dose  . acetaminophen (TYLENOL) tablet 650 mg  650 mg Oral Q4H PRN [name], NP   650 mg at 03/13/16 1729  . alum & mag hydroxide-simeth (MAALOX/MYLANTA) 200-200-20 MG/5ML suspension 30 mL  30 mL Oral Q4H PRN [name], NP      . [name] ON 03/15/2016] cloNIDine (CATAPRES) tablet 0.1 mg  0.1 mg Oral QAC breakfast [name], MD      . dicyclomine (BENTYL) tablet 20 mg  20 mg Oral Q6H PRN [name], MD      . hydrOXYzine (ATARAX/VISTARIL) tablet 25 mg  25 mg Oral Q6H PRN [name], MD   25 mg at 03/13/16 1729  . lip balm (CARMEX) ointment   Topical PRN [name], MD      . loperamide (IMODIUM) capsule 2-4 mg  2-4 mg Oral PRN [name], MD      . LORazepam (ATIVAN) tablet 1 mg  1 mg Oral Daily [name], MD      . magnesium hydroxide (MILK OF MAGNESIA) suspension 30 mL  30 mL Oral Daily PRN [name], NP      . methocarbamol (ROBAXIN) tablet 500 mg  500 mg Oral Q8H PRN [name], MD   500 mg at 03/11/16 0746  . multivitamin with minerals tablet 1 tablet  1 tablet Oral Daily [name], MD   1 tablet at 03/13/16 9678  . naproxen (NAPROSYN) tablet 500 mg  500 mg Oral BID PRN [name], MD      . nicotine (NICODERM CQ - dosed in mg/24 hours) patch 21 mg  21 mg Transdermal Daily [name], MD   21 mg at 03/13/16 0925  . ondansetron  (ZOFRAN-ODT) disintegrating tablet 4 mg  4 mg Oral Q6H PRN [name], MD   4 mg at 03/11/16 0746  . propranolol (INDERAL) tablet 20 mg  20 mg Oral BID [name], MD   20 mg at 03/13/16 1715  . QUEtiapine (SEROQUEL) tablet 200 mg  200 mg Oral QHS [name], MD   200 mg at 03/13/16 2154  . sertraline (ZOLOFT) tablet 25 mg  25 mg Oral Daily [name], MD   25 mg at 03/13/16 0926  . thiamine (VITAMIN B-1) tablet 100 mg  100 mg Oral Daily [name], MD   100 mg at 03/13/16 9381   PTA Medications: Prescriptions Prior to Admission  Medication Sig Dispense Refill Last Dose  . methocarbamol (ROBAXIN) 500 MG tablet Take 1 tablet (500 mg total) by mouth 2 (two) times daily as needed for muscle spasms. (Patient not taking: Reported on 03/09/2016) 20 tablet 0 Not Taking at Unknown time  . naproxen (NAPROSYN) 500 MG tablet Take 1 tablet (500 mg total) by mouth 2 (two) times daily with a meal. (Patient not taking: Reported on 03/09/2016) 30 tablet 0 Not Taking at Unknown time  . PROPRANOLOL HCL PO Take 1 tablet by mouth 2 (two) times daily.   Past Month at Unknown time    Patient Stressors: Loss  of  relationship ("my girlfriend is locked up") Substance abuse  Patient Strengths: Ability for insight Average or above average intelligence Capable of independent living Communication skills Motivation for treatment/growth Physical Health Work skills  Treatment Modalities: Medication Management, Group therapy, Case management,  1 to 1 session with clinician, Psychoeducation, Recreational therapy.   Physician Treatment Plan for Primary Diagnosis: Polysubstance (including opioids) dependence with physiological dependence ([place]) Long Term Goal(s): Improvement in symptoms so as ready for discharge Improvement in symptoms so as ready for discharge   Short Term Goals: Ability to demonstrate self-control will improve Ability to maintain clinical measurements within normal limits  will improve Ability to identify changes in lifestyle to reduce recurrence of condition will improve Ability to identify triggers associated with substance abuse/mental health issues will improve  Medication Management: Evaluate patient's response, side effects, and tolerance of medication regimen.  Therapeutic Interventions: 1 to 1 sessions, Unit Group sessions and Medication administration.  Evaluation of Outcomes: Adequate for discharge.   Physician Treatment Plan for Secondary Diagnosis: Principal Problem:   Polysubstance (including opioids) dependence with physiological dependence ([place]) Active Problems:   Substance induced mood disorder ([place])  Long Term Goal(s): Improvement in symptoms so as ready for discharge Improvement in symptoms so as ready for discharge   Short Term Goals: Ability to demonstrate self-control will improve Ability to maintain clinical measurements within normal limits will improve Ability to identify changes in lifestyle to reduce recurrence of condition will improve Ability to identify triggers associated with substance abuse/mental health issues will improve     Medication Management: Evaluate patient's response, side effects, and tolerance of medication regimen.  Therapeutic Interventions: 1 to 1 sessions, Unit Group sessions and Medication administration.  Evaluation of Outcomes: Met   RN Treatment Plan for Primary Diagnosis: Polysubstance (including opioids) dependence with physiological dependence ([place]) Long Term Goal(s): Knowledge of disease and therapeutic regimen to maintain health will improve  Short Term Goals: Ability to remain free from injury will improve, Ability to disclose and discuss suicidal ideas and Ability to identify and develop effective coping behaviors will improve  Medication Management: RN will administer medications as ordered by provider, will assess and evaluate patient's response and provide education to patient for  prescribed medication. RN will report any adverse and/or side effects to prescribing provider.  Therapeutic Interventions: 1 on 1 counseling sessions, Psychoeducation, Medication administration, Evaluate responses to treatment, Monitor vital signs and CBGs as ordered, Perform/monitor CIWA, COWS, AIMS and Fall Risk screenings as ordered, Perform wound care treatments as ordered.  Evaluation of Outcomes: Met   LCSW Treatment Plan for Primary Diagnosis: Polysubstance (including opioids) dependence with physiological dependence ([place]) Long Term Goal(s): Safe transition to appropriate next level of care at discharge, Engage patient in therapeutic group addressing interpersonal concerns.  Short Term Goals: Engage patient in aftercare planning with referrals and resources, Increase social support, Facilitate patient progression through stages of change regarding substance use diagnoses and concerns and Identify triggers associated with mental health/substance abuse issues  Therapeutic Interventions: Assess for all discharge needs, 1 to 1 time with Social worker, Explore available resources and support systems, Assess for adequacy in community support network, Educate family and significant other(s) on suicide prevention, Complete Psychosocial Assessment, Interpersonal group therapy.  Evaluation of Outcomes: Adequate for discharge (to [facility]).    Progress in Treatment: Attending groups:Yes Participating in groups: Minimally, when he attends.  Taking medication as prescribed: Yes. Toleration medication: Yes. Family/Significant other contact made: SPE completed with pt; pt declined to consent to  family contact.  Patient understands diagnosis: Yes. Discussing patient identified problems/goals with staff: Yes. Medical problems stabilized or resolved: Yes. Denies suicidal/homicidal ideation: Yes. Issues/concerns per patient self-inventory: No. Other: n/a  New problem(s) identified: No, Describe:   n/a  New Short Term/Long Term Goal(s): medication stabilization, detox; possibly inpatient treatment from here. Pt unsure at this time.   Discharge Plan or Barriers: [facility] accepted pt for today 03/14/2016.   Reason for Continuation of Hospitalization: none  Estimated Length of Stay: d/c today directly to [facility]. 21 day supply needed.   Attendees: Patient: 03/14/2016 8:40 AM  Physician: Dr. Sharolyn Douglas MD 03/14/2016 8:40 AM  Nursing: Douglass Rivers RN 03/14/2016 8:40 AM  RN Care Manager: Lars Pinks CM 03/14/2016 8:40 AM  Social Worker: Maxie Better, LCSW 03/14/2016 8:40 AM  Recreational Therapist:  03/14/2016 8:40 AM  Other:  03/14/2016 8:40 AM  Other:  03/14/2016 8:40 AM  Other: 03/14/2016 8:40 AM    Scribe for Treatment Team: Kimber Relic Smart, LCSW 03/14/2016 8:40 AM

## 2016-06-25 ENCOUNTER — Emergency Department (HOSPITAL_COMMUNITY)
Admission: EM | Admit: 2016-06-25 | Discharge: 2016-06-25 | Disposition: A | Discharge status: Home / Self Care | Payer: Self-pay | Attending: Emergency Medicine | Admitting: Emergency Medicine

## 2016-06-25 ENCOUNTER — Encounter (HOSPITAL_COMMUNITY): Payer: Self-pay | Admitting: *Deleted

## 2016-06-25 DIAGNOSIS — Z79899 Other long term (current) drug therapy: Secondary | ICD-10-CM | POA: Insufficient documentation

## 2016-06-25 DIAGNOSIS — F17210 Nicotine dependence, cigarettes, uncomplicated: Secondary | ICD-10-CM | POA: Insufficient documentation

## 2016-06-25 DIAGNOSIS — Z7982 Long term (current) use of aspirin: Secondary | ICD-10-CM | POA: Insufficient documentation

## 2016-06-25 DIAGNOSIS — I10 Essential (primary) hypertension: Secondary | ICD-10-CM

## 2016-06-25 HISTORY — DX: Essential (primary) hypertension: I10

## 2016-06-25 LAB — I-STAT CHEM 8, ED
BUN: 9 mg/dL (ref 6–20)
CALCIUM ION: 1.16 mmol/L (ref 1.15–1.40)
CREATININE: 1 mg/dL (ref 0.61–1.24)
Chloride: 103 mmol/L (ref 101–111)
Glucose, Bld: 94 mg/dL (ref 65–99)
HEMATOCRIT: 49 % (ref 39.0–52.0)
HEMOGLOBIN: 16.7 g/dL (ref 13.0–17.0)
Potassium: 3.8 mmol/L (ref 3.5–5.1)
SODIUM: 140 mmol/L (ref 135–145)
TCO2: 25 mmol/L (ref 0–100)

## 2016-06-25 LAB — I-STAT TROPONIN, ED: Troponin i, poc: 0 ng/mL (ref 0.00–0.08)

## 2016-06-25 MED ORDER — PROPRANOLOL HCL 20 MG PO TABS
20.0000 mg | ORAL_TABLET | Freq: Once | ORAL | Status: AC
  Administered 2016-06-25: 20 mg via ORAL
  Filled 2016-06-25: qty 1

## 2016-06-25 MED ORDER — PROPRANOLOL HCL 20 MG PO TABS: 20.0000 mg | ORAL_TABLET | Freq: Two times a day (BID) | ORAL | 0 refills | Status: DC

## 2016-06-25 NOTE — ED Provider Notes (Signed)
[facility]-EMERGENCY DEPT Provider Note   CSN: [id number] Arrival date & time: 06/25/16  1843     History   Chief Complaint Chief Complaint  Patient presents with  . Arm Pain    HPI Todd [name] is a 41 y.o. male.  Patient is a 41 year old male with a history of hypertension and prior substance abuse who presents with elevated blood pressure. He states he's been out of his medicine for about a month. He previously was on propanolol. He states over the last week he's had some intermittent headaches and intermittent pain to his left upper arm. He denies any chest pain or shortness of breath. 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 He states the pain in his left chest is fleeting and only last about a second. It comes and goes throughout the day. It's not related to movement. There is no numbness in the extremity. He denies any nausea or vomiting.      Past Medical History:  Diagnosis Date  . Hepatitis C   . Hypertension     Patient Active Problem List   Diagnosis Date Noted  . Major depressive disorder, 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  . Polysubstance (including opioids) dependence with physiological dependence ([facility]) 03/10/2016  . Substance induced mood disorder (HCC) 02/26/2013  . PTSD (post-traumatic stress disorder) 02/26/2013  . Polysubstance dependence including opioid type drug, episodic abuse (HCC) 02/24/2013  . Alcohol abuse 02/24/2013    Past Surgical History:  Procedure Laterality Date  . TESTICLE TORSION REDUCTION         Home Medications    Prior to Admission medications   Medication Sig Start Date End Date Taking? Authorizing Provider  Aspirin-Acetaminophen-Caffeine (GOODY HEADACHE PO) Take 1 Package by mouth 3 times/day as needed-between meals & bedtime (headache).   Yes Historical Provider, MD  hydrOXYzine (ATARAX/VISTARIL) 25 MG tablet Take 1 tablet (25 mg total) by mouth every 6 (six) hours as needed for  anxiety. 03/14/16  Yes Beau Fanny, FNP  ibuprofen (ADVIL,MOTRIN) 200 MG tablet Take 800 mg by mouth every 6 (six) hours as needed.   Yes Historical Provider, MD  QUEtiapine (SEROQUEL) 200 MG tablet Take 1 tablet (200 mg total) by mouth at bedtime. 03/14/16  Yes Beau Fanny, FNP  sertraline (ZOLOFT) 25 MG tablet Take 1 tablet (25 mg total) by mouth daily. 03/15/16  Yes Beau Fanny, FNP  lisinopril (PRINIVIL,ZESTRIL) 10 MG tablet Take 10 mg by mouth daily.    Historical Provider, MD  nicotine (NICODERM CQ - DOSED IN MG/24 HOURS) 21 mg/24hr patch Place 1 patch (21 mg total) onto the skin daily. Patient not taking: Reported on 06/25/2016 03/15/16   Beau Fanny, FNP  propranolol (INDERAL) 20 MG tablet Take 1 tablet (20 mg total) by mouth 2 (two) times daily. 06/25/16   Rolan Bucco, MD  thiamine 100 MG tablet Take 1 tablet (100 mg total) by mouth daily. Patient not taking: Reported on 06/25/2016 03/15/16   Beau Fanny, FNP    Family History No family history on file.  Social History Social History  Substance Use Topics  . Smoking status: Current Every Day Smoker    Packs/day: 1.00    Types: Cigarettes  . Smokeless tobacco: Never Used  . Alcohol use No     Comment: "alot"- In [place]     Allergies   Fish allergy and Penicillins   Review of Systems Review of Systems  Constitutional: Negative for chills, diaphoresis,  fatigue and fever.  HENT: Negative for congestion, rhinorrhea and sneezing.   Eyes: Negative.   Respiratory: Negative for cough, chest tightness and shortness of breath.   Cardiovascular: Negative for chest pain and leg swelling.  Gastrointestinal: Negative for abdominal pain, blood in stool, diarrhea, nausea and vomiting.  Genitourinary: Negative for difficulty urinating, flank pain, frequency and hematuria.  Musculoskeletal: Positive for myalgias. Negative for arthralgias and back pain.  Skin: Negative for rash.  Neurological: Positive for headaches.  Negative for dizziness, speech difficulty, weakness and numbness.     Physical Exam Updated Vital Signs BP (!) 156/110   Pulse 68   Temp 99.1 F (37.3 C) (Oral)   Resp 19   Ht 6\' 1"  (1.854 m)   Wt 236 lb (107 kg)   SpO2 97%   BMI 31.14 kg/m   Physical Exam  Constitutional: He is oriented to person, place, and time. He appears well-developed and well-nourished.  HENT:  Head: Normocephalic and atraumatic.  Eyes: Pupils are equal, round, and reactive to light.  Neck: Normal range of motion. Neck supple.  Cardiovascular: Normal rate, regular rhythm and normal heart sounds.   Pulmonary/Chest: Effort normal and breath sounds normal. No respiratory distress. He has no wheezes. He has no rales. He exhibits no tenderness.  Abdominal: Soft. Bowel sounds are normal. There is no tenderness. There is no rebound and no guarding.  Musculoskeletal: Normal range of motion. He exhibits no edema.  Lymphadenopathy:    He has no cervical adenopathy.  Neurological: He is alert and oriented to person, place, and time.  Skin: Skin is warm and dry. No rash noted.  Psychiatric: He has a normal mood and affect.     ED Treatments / Results  Labs (all labs ordered are listed, but only abnormal results are displayed) Labs Reviewed  I-STAT CHEM 8, ED  I-STAT TROPOININ, ED    EKG  EKG Interpretation  Date/Time:  Saturday June 25 2016 19:19:24 EST Ventricular Rate:  72 PR Interval:    QRS Duration: 89 QT Interval:  384 QTC Calculation: 421 R Axis:   15 Text Interpretation:  Sinus rhythm since last tracing no significant change Confirmed by Clemma Johnsen  MD, Kalyse Meharg ([id number]) on 06/25/2016 7:34:18 PM       Radiology No results found.  Procedures Procedures (including critical care time)  Medications Ordered in ED Medications  propranolol (INDERAL) tablet 20 mg (20 mg Oral Given 06/25/16 2128)     Initial Impression / Assessment and Plan / ED Course  I have reviewed the triage vital  signs and the nursing notes.  Pertinent labs & imaging results that were available during my care of the patient were reviewed by me and considered in my medical decision making (see chart for details).    Patient presents with an elevated blood pressure. He is asymptomatic. He's had some intermittent pain in his left arm but it doesn't seem to be anginal in nature. It's bleeding. It lasts only 1-2 seconds and is sharp. He doesn't have any other anginal type symptoms. He has no strokelike symptoms. I will get him started back on his propanolol. I gave him a resource guide for outpatient follow-up. Return precautions given.   Final Clinical Impressions(s) / ED Diagnoses   Final diagnoses:  Essential hypertension    New Prescriptions Current Discharge Medication List       Rolan BuccoMelanie Shardai Star, MD 06/25/16 2250

## 2016-06-25 NOTE — ED Triage Notes (Signed)
Pt c/o left upper arm pain since after lunch, describes pain as "sharp" and it comes and goes. Pt also has had a headache today, reports hx of high blood pressure.Denies CP Pt says he has been off his meds for about 1 month d/t financial reasons.

## 2016-09-01 ENCOUNTER — Inpatient Hospital Stay (HOSPITAL_COMMUNITY)
Admission: EM | Admit: 2016-09-01 | Discharge: 2016-09-03 | DRG: 683 | Disposition: A | Discharge status: Home / Self Care | Payer: Self-pay | Attending: Internal Medicine | Admitting: Internal Medicine

## 2016-09-01 ENCOUNTER — Encounter (HOSPITAL_COMMUNITY): Payer: Self-pay | Admitting: Emergency Medicine

## 2016-09-01 ENCOUNTER — Observation Stay (HOSPITAL_COMMUNITY): Payer: Self-pay

## 2016-09-01 DIAGNOSIS — F1120 Opioid dependence, uncomplicated: Secondary | ICD-10-CM | POA: Diagnosis present

## 2016-09-01 DIAGNOSIS — F1410 Cocaine abuse, uncomplicated: Secondary | ICD-10-CM | POA: Diagnosis present

## 2016-09-01 DIAGNOSIS — F332 Major depressive disorder, recurrent severe without psychotic features: Secondary | ICD-10-CM | POA: Diagnosis present

## 2016-09-01 DIAGNOSIS — Z8249 Family history of ischemic heart disease and other diseases of the circulatory system: Secondary | ICD-10-CM

## 2016-09-01 DIAGNOSIS — T6701XA Heatstroke and sunstroke, initial encounter: Secondary | ICD-10-CM

## 2016-09-01 DIAGNOSIS — R109 Unspecified abdominal pain: Secondary | ICD-10-CM | POA: Diagnosis present

## 2016-09-01 DIAGNOSIS — Z79899 Other long term (current) drug therapy: Secondary | ICD-10-CM

## 2016-09-01 DIAGNOSIS — F1010 Alcohol abuse, uncomplicated: Secondary | ICD-10-CM | POA: Diagnosis present

## 2016-09-01 DIAGNOSIS — M6282 Rhabdomyolysis: Secondary | ICD-10-CM | POA: Diagnosis present

## 2016-09-01 DIAGNOSIS — R10A2 Flank pain, left side: Secondary | ICD-10-CM | POA: Insufficient documentation

## 2016-09-01 DIAGNOSIS — F1920 Other psychoactive substance dependence, uncomplicated: Secondary | ICD-10-CM

## 2016-09-01 DIAGNOSIS — E860 Dehydration: Secondary | ICD-10-CM | POA: Diagnosis present

## 2016-09-01 DIAGNOSIS — N179 Acute kidney failure, unspecified: Principal | ICD-10-CM | POA: Diagnosis present

## 2016-09-01 DIAGNOSIS — F4310 Post-traumatic stress disorder, unspecified: Secondary | ICD-10-CM | POA: Diagnosis present

## 2016-09-01 DIAGNOSIS — F1110 Opioid abuse, uncomplicated: Secondary | ICD-10-CM | POA: Diagnosis present

## 2016-09-01 DIAGNOSIS — D72829 Elevated white blood cell count, unspecified: Secondary | ICD-10-CM | POA: Diagnosis present

## 2016-09-01 DIAGNOSIS — Z91013 Allergy to seafood: Secondary | ICD-10-CM

## 2016-09-01 DIAGNOSIS — F39 Unspecified mood [affective] disorder: Secondary | ICD-10-CM | POA: Diagnosis present

## 2016-09-01 DIAGNOSIS — I10 Essential (primary) hypertension: Secondary | ICD-10-CM | POA: Diagnosis present

## 2016-09-01 DIAGNOSIS — Z880 Allergy status to penicillin: Secondary | ICD-10-CM

## 2016-09-01 DIAGNOSIS — B1920 Unspecified viral hepatitis C without hepatic coma: Secondary | ICD-10-CM | POA: Diagnosis present

## 2016-09-01 LAB — COMPREHENSIVE METABOLIC PANEL
ALBUMIN: 4.4 g/dL (ref 3.5–5.0)
ALK PHOS: 67 U/L (ref 38–126)
ALT: 107 U/L — ABNORMAL HIGH (ref 17–63)
ANION GAP: 11 (ref 5–15)
AST: 50 U/L — ABNORMAL HIGH (ref 15–41)
BUN: 27 mg/dL — ABNORMAL HIGH (ref 6–20)
CALCIUM: 9.8 mg/dL (ref 8.9–10.3)
CHLORIDE: 99 mmol/L — AB (ref 101–111)
CO2: 24 mmol/L (ref 22–32)
Creatinine, Ser: 2.71 mg/dL — ABNORMAL HIGH (ref 0.61–1.24)
GFR calc Af Amer: 32 mL/min — ABNORMAL LOW (ref >60)
GFR calc non Af Amer: 28 mL/min — ABNORMAL LOW (ref >60)
GLUCOSE: 98 mg/dL (ref 65–99)
Potassium: 4 mmol/L (ref 3.5–5.1)
Sodium: 134 mmol/L — ABNORMAL LOW (ref 135–145)
Total Bilirubin: 0.8 mg/dL (ref 0.3–1.2)
Total Protein: 8.2 g/dL — ABNORMAL HIGH (ref 6.5–8.1)

## 2016-09-01 LAB — CBC WITH DIFFERENTIAL/PLATELET
BASOS PCT: 0 %
Basophils Absolute: 0 10*3/uL (ref 0.0–0.1)
Eosinophils Absolute: 0.1 10*3/uL (ref 0.0–0.7)
Eosinophils Relative: 0 %
HEMATOCRIT: 44.9 % (ref 39.0–52.0)
HEMOGLOBIN: 16 g/dL (ref 13.0–17.0)
LYMPHS ABS: 2.8 10*3/uL (ref 0.7–4.0)
Lymphocytes Relative: 19 %
MCH: 30 pg (ref 26.0–34.0)
MCHC: 35.6 g/dL (ref 30.0–36.0)
MCV: 84.2 fL (ref 78.0–100.0)
MONO ABS: 0.9 10*3/uL (ref 0.1–1.0)
MONOS PCT: 6 %
NEUTROS ABS: 11.2 10*3/uL — AB (ref 1.7–7.7)
NEUTROS PCT: 75 %
Platelets: 305 10*3/uL (ref 150–400)
RBC: 5.33 MIL/uL (ref 4.22–5.81)
RDW: 12.3 % (ref 11.5–15.5)
WBC: 15 10*3/uL — ABNORMAL HIGH (ref 4.0–10.5)

## 2016-09-01 LAB — CK: Total CK: 450 U/L — ABNORMAL HIGH (ref 49–397)

## 2016-09-01 LAB — URINALYSIS, ROUTINE W REFLEX MICROSCOPIC
BACTERIA UA: NONE SEEN
BILIRUBIN URINE: NEGATIVE
Glucose, UA: NEGATIVE mg/dL
Ketones, ur: NEGATIVE mg/dL
Leukocytes, UA: NEGATIVE
Nitrite: NEGATIVE
PH: 5 (ref 5.0–8.0)
Protein, ur: 30 mg/dL — AB
Specific Gravity, Urine: 1.011 (ref 1.005–1.030)

## 2016-09-01 LAB — RAPID URINE DRUG SCREEN, HOSP PERFORMED
AMPHETAMINES: NOT DETECTED
BARBITURATES: NOT DETECTED
Benzodiazepines: NOT DETECTED
Cocaine: NOT DETECTED
OPIATES: NOT DETECTED
TETRAHYDROCANNABINOL: NOT DETECTED

## 2016-09-01 LAB — LIPASE, BLOOD: LIPASE: 27 U/L (ref 11–51)

## 2016-09-01 MED ORDER — SODIUM CHLORIDE 0.9 % IV BOLUS (SEPSIS)
1000.0000 mL | Freq: Once | INTRAVENOUS | Status: AC
  Administered 2016-09-01: 1000 mL via INTRAVENOUS

## 2016-09-01 MED ORDER — ONDANSETRON HCL 4 MG PO TABS: 4.0000 mg | ORAL_TABLET | Freq: Four times a day (QID) | ORAL | Status: DC | PRN

## 2016-09-01 MED ORDER — HYDRALAZINE HCL 20 MG/ML IJ SOLN: 5.0000 mg | INTRAMUSCULAR | Status: DC | PRN

## 2016-09-01 MED ORDER — NICOTINE 21 MG/24HR TD PT24
21.0000 mg | MEDICATED_PATCH | Freq: Every day | TRANSDERMAL | Status: DC
  Administered 2016-09-02: 21 mg via TRANSDERMAL
  Filled 2016-09-01 (×3): qty 1

## 2016-09-01 MED ORDER — HYDROXYZINE HCL 25 MG PO TABS: 25.0000 mg | ORAL_TABLET | Freq: Four times a day (QID) | ORAL | Status: DC | PRN

## 2016-09-01 MED ORDER — PROPRANOLOL HCL 20 MG PO TABS
20.0000 mg | ORAL_TABLET | Freq: Two times a day (BID) | ORAL | Status: DC
  Administered 2016-09-02 – 2016-09-03 (×3): 20 mg via ORAL
  Filled 2016-09-01 (×5): qty 1

## 2016-09-01 MED ORDER — ZOLPIDEM TARTRATE 5 MG PO TABS
5.0000 mg | ORAL_TABLET | Freq: Every evening | ORAL | Status: DC | PRN
Start: 2016-09-01 — End: 2016-09-03

## 2016-09-01 MED ORDER — ONDANSETRON HCL 4 MG/2ML IJ SOLN: 4.0000 mg | Freq: Four times a day (QID) | INTRAMUSCULAR | Status: DC | PRN

## 2016-09-01 MED ORDER — SODIUM CHLORIDE 0.9 % IV SOLN
INTRAVENOUS | Status: DC
  Administered 2016-09-02 – 2016-09-03 (×5): via INTRAVENOUS

## 2016-09-01 MED ORDER — SERTRALINE HCL 25 MG PO TABS
25.0000 mg | ORAL_TABLET | Freq: Every day | ORAL | Status: DC
  Administered 2016-09-02 – 2016-09-03 (×2): 25 mg via ORAL
  Filled 2016-09-01 (×3): qty 1

## 2016-09-01 MED ORDER — ENOXAPARIN SODIUM 40 MG/0.4ML ~~LOC~~ SOLN
40.0000 mg | Freq: Every day | SUBCUTANEOUS | Status: DC
  Filled 2016-09-01 (×2): qty 0.4

## 2016-09-01 MED ORDER — DIAZEPAM 5 MG PO TABS
5.0000 mg | ORAL_TABLET | Freq: Two times a day (BID) | ORAL | Status: DC | PRN
  Administered 2016-09-01 – 2016-09-02 (×2): 5 mg via ORAL
  Filled 2016-09-01 (×2): qty 1

## 2016-09-01 NOTE — H&P (Addendum)
History and Physical    Todd [name] RUE:[id number] DOB: 02-17-1976 DOA: 09/01/2016  Referring MD/NP/PA:   PCP: No PCP Per Patient   Patient coming from:  The patient is coming from home.  At baseline, pt is independent for most of ADL.   Chief Complaint: left flank pain, muscle cramps, dark urine  HPI: Todd [name] is a 41 y.o. male with medical history significant of hypertension, depression, HCV, tobacco abuse, PTSD, polysubstance abuse including alcohol and cocaine, who presents with left flank pain, muscle cramps and dark urine.  Pt states that he started having left flank pain, and muscle cramps in left flank and lower abdomen. His flank pain is constant, 6 out of 10 in severity, dull, nonradiating. Denies any injury. He noticed dark urine, but denies dysuria or burning on urination. No urinary frequency. He has nausea, but no vomiting or diarrhea. No fever or chills. Denies chest pain, SOB, cough or unilateral weakness. 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 He states that he did not drink alcohol in the past two weeks.  ED Course: pt was found to have CK 450, negative urinalysis for UTI, acute renal injury with creatinine 2.71, temperature normal, oxygen saturation 100% on room air. Pt is placed on med-surg bed for obs.  Review of Systems:   General: no fevers, chills, no changes in body weight, has fatigue HEENT: no blurry vision, hearing changes or sore throat Respiratory: no dyspnea, coughing, wheezing CV: no chest pain, no palpitations GI: has nausea, no vomiting, has abdominal cramps, diarrhea, constipation GU: no dysuria, burning on urination, increased urinary frequency, has dark urine. Ext: no leg edema Neuro: no unilateral weakness, numbness, or tingling, no vision change or hearing loss Skin: no rash, no skin tear. MSK: No muscle spasm, no deformity, no limitation of range of movement in spin Heme:  No easy bruising.  Travel history: No recent long distant travel.  Allergy:  Allergies  Allergen Reactions  . Fish Allergy Anaphylaxis  . Penicillins Anaphylaxis    Past Medical History:  Diagnosis Date  . Hepatitis C   . Hypertension     Past Surgical History:  Procedure Laterality Date  . TESTICLE TORSION REDUCTION      Social History:  reports that he has been smoking Cigarettes.  He has been smoking about 1.00 pack per day. He has never used smokeless tobacco. He reports that he does not drink alcohol or use drugs.  Family History:  Family History  Problem Relation Age of Onset  . COPD Father   . Hypertension Father   . Diabetes Mellitus II Father      Prior to Admission medications   Medication Sig Start Date End Date Taking? Authorizing Provider  ibuprofen (ADVIL,MOTRIN) 200 MG tablet Take 800 mg by mouth every 6 (six) hours as needed.   Yes Historical Provider, MD  lisinopril (PRINIVIL,ZESTRIL) 10 MG tablet Take 10 mg by mouth daily.   Yes Historical Provider, MD  propranolol (INDERAL) 20 MG tablet Take 1 tablet (20 mg total) by mouth 2 (two) times daily. 06/25/16  Yes Rolan Bucco, MD  sertraline (ZOLOFT) 25 MG tablet Take 1 tablet (25 mg total) by mouth daily. 03/15/16  Yes Beau Fanny, FNP  hydrOXYzine (ATARAX/VISTARIL) 25 MG tablet Take 1 tablet (25 mg total) by mouth every 6 (six) hours as needed for anxiety. 03/14/16   Beau Fanny, FNP  nicotine (NICODERM CQ - DOSED  IN MG/24 HOURS) 21 mg/24hr patch Place 1 patch (21 mg total) onto the skin daily. Patient not taking: Reported on 06/25/2016 03/15/16   Beau Fanny, FNP  QUEtiapine (SEROQUEL) 200 MG tablet Take 1 tablet (200 mg total) by mouth at bedtime. Patient not taking: Reported on 09/01/2016 03/14/16   Beau Fanny, FNP  thiamine 100 MG tablet Take 1 tablet (100 mg total) by mouth daily. Patient not taking: Reported on 06/25/2016 03/15/16   Beau Fanny, FNP    Physical Exam: Vitals:   09/01/16  1904  BP: 120/81  Pulse: 90  Resp: 20  Temp: 98.3 F (36.8 C)  TempSrc: Oral  SpO2: 100%  Weight: 113.4 kg (250 lb)   General: Not in acute distress HEENT:       Eyes: PERRL, EOMI, no scleral icterus.       ENT: No discharge from the ears and nose, no pharynx injection, no tonsillar enlargement.        Neck: No JVD, no bruit, no mass felt. Heme: No neck lymph node enlargement. Cardiac: S1/S2, RRR, No murmurs, No gallops or rubs. Respiratory: No rales, wheezing, rhonchi or rubs. GI: Soft, nondistended, mild tenderness in lower abdomen, no rebound pain, no organomegaly, BS present. GU: No hematuria Ext: No pitting leg edema bilaterally. 2+DP/PT pulse bilaterally. Musculoskeletal: No joint deformities, No joint redness or warmth, no limitation of ROM in spin. Has left flank tenderness. Skin: No rashes.  Neuro: Alert, oriented X3, cranial nerves II-XII grossly intact, moves all extremities normally. Psych: Patient is not psychotic, no suicidal or hemocidal ideation.  Labs on Admission: I have personally reviewed following labs and imaging studies  CBC:  Recent Labs Lab 09/01/16 2037  WBC 15.0*  NEUTROABS 11.2*  HGB 16.0  HCT 44.9  MCV 84.2  PLT 305   Basic Metabolic Panel:  Recent Labs Lab 09/01/16 2037  NA 134*  K 4.0  CL 99*  CO2 24  GLUCOSE 98  BUN 27*  CREATININE 2.71*  CALCIUM 9.8   GFR: Estimated Creatinine Clearance: 47.8 mL/min (A) (by C-G formula based on SCr of 2.71 mg/dL (H)). Liver Function Tests:  Recent Labs Lab 09/01/16 2037  AST 50*  ALT 107*  ALKPHOS 67  BILITOT 0.8  PROT 8.2*  ALBUMIN 4.4   No results for input(s): LIPASE, AMYLASE in the last 168 hours. No results for input(s): AMMONIA in the last 168 hours. Coagulation Profile: No results for input(s): INR, PROTIME in the last 168 hours. Cardiac Enzymes:  Recent Labs Lab 09/01/16 2037  CKTOTAL 450*   BNP (last 3 results) No results for input(s): PROBNP in the last 8760  hours. HbA1C: No results for input(s): HGBA1C in the last 72 hours. CBG: No results for input(s): GLUCAP in the last 168 hours. Lipid Profile: No results for input(s): CHOL, HDL, LDLCALC, TRIG, CHOLHDL, LDLDIRECT in the last 72 hours. Thyroid Function Tests: No results for input(s): TSH, T4TOTAL, FREET4, T3FREE, THYROIDAB in the last 72 hours. Anemia Panel: No results for input(s): VITAMINB12, FOLATE, FERRITIN, TIBC, IRON, RETICCTPCT in the last 72 hours. Urine analysis:    Component Value Date/Time   COLORURINE YELLOW 09/01/2016 2023   APPEARANCEUR HAZY (A) 09/01/2016 2023   LABSPEC 1.011 09/01/2016 2023   PHURINE 5.0 09/01/2016 2023   GLUCOSEU NEGATIVE 09/01/2016 2023   HGBUR MODERATE (A) 09/01/2016 2023   BILIRUBINUR NEGATIVE 09/01/2016 2023   KETONESUR NEGATIVE 09/01/2016 2023   PROTEINUR 30 (A) 09/01/2016 2023   NITRITE NEGATIVE 09/01/2016  2023   LEUKOCYTESUR NEGATIVE 09/01/2016 2023   Sepsis Labs: (procalcitonin:4,lacticidven:4) )No results found for this or any previous visit (from the past 240 hour(s)).   Radiological Exams on Admission: No results found.   EKG: Not done in ED, will get one.   Assessment/Plan Principal Problem:   AKI (acute kidney injury) (HCC) Active Problems:   Polysubstance dependence including opioid type drug, episodic abuse (HCC)   Alcohol abuse   Major depressive disorder, recurrent severe without psychotic features (HCC)   Rhabdomyolysis   Essential hypertension   AKI: Cre 2.71 and BUN 27.  Likely due to combination of rhabdomyolysis and prerenal failure secondary to dehydration and continuation of ACEI and NSAIDs. Pt has left flank pain, UA negative, will need to r/o kidney stone. Patient has mild lower abdominal tenderness, but no acute abdomen on physical examination.  - will place on med-surg bed for obs - IVF as below - Check FeNa  - Follow up renal function by BMP - Hold lisinopril and ibuprofen - Ct-renal stone  study - check lipase - prn Valium for muscle cramps  Mild Rhabdomyolysis: Ck 450.  -IVF: 1L NS, then 150 cc/h -repeat CK in AM  Hx of Polysubstance abuse: Including tobacco, alcohol, cocaine and opiate abuse. Patient states that he did not drink alcohol in the past 2 weeks. No signs of withdraw currently. -Did counseling about the importance of quitting substance use. -check UDS -nicotine patch  Depression: Stable, no suicidal or homicidal ideations. -Continue home medications: Zoloft   HTN: Blood pressure 120/81 -hold lisinopril due to acute renal injury -IV hydralazine when necessary -Continue propranolol  Leukocytosis: no signs of infection. Likely due to stress induced to demargination. UA negative. No fever. -follow up by CBC  Hx of HCV: AST 50 and ALT 107. No acute issues. -avoid tylenol   DVT ppx: SQ Lovenox Code Status: Full code Family Communication: None at bed side.   Disposition Plan:  Anticipate discharge back to previous home environment Consults called:  none Admission status:   medical floor/obs      Date of Service 09/01/2016    Lorretta Harp [facility] Pager [phone]  If 7PM-7AM, please contact night-coverage [email or web address] Password [email or web address] 09/01/2016, 10:20 PM

## 2016-09-01 NOTE — ED Triage Notes (Signed)
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 Well hydrated.

## 2016-09-01 NOTE — ED Provider Notes (Signed)
[facility]-EMERGENCY DEPT Provider Note   CSN: [id number] Arrival date & time: 09/01/16  1859     History   Chief Complaint Chief Complaint  Patient presents with  . Flank Pain    HPI Todd [name] is a 41 y.o. male.  He presents for evaluation of cramping, nausea, weakness, starting today while working.  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  He denies recent alcohol abuse, fever, vomiting, cough or chest pain.  There are no other no modifying factors.  HPI  Past Medical History:  Diagnosis Date  . Hepatitis C   . Hypertension     Patient Active Problem List   Diagnosis Date Noted  . Major depressive disorder, 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  . Polysubstance (including opioids) dependence with physiological dependence (HCC) 03/10/2016  . Substance induced mood disorder (HCC) 02/26/2013  . PTSD (post-traumatic stress disorder) 02/26/2013  . Polysubstance dependence including opioid type drug, episodic abuse (HCC) 02/24/2013  . Alcohol abuse 02/24/2013    Past Surgical History:  Procedure Laterality Date  . TESTICLE TORSION REDUCTION         Home Medications    Prior to Admission medications   Medication Sig Start Date End Date Taking? Authorizing Provider  ibuprofen (ADVIL,MOTRIN) 200 MG tablet Take 800 mg by mouth every 6 (six) hours as needed.   Yes Historical Provider, MD  lisinopril (PRINIVIL,ZESTRIL) 10 MG tablet Take 10 mg by mouth daily.   Yes Historical Provider, MD  propranolol (INDERAL) 20 MG tablet Take 1 tablet (20 mg total) by mouth 2 (two) times daily. 06/25/16  Yes Rolan Bucco, MD  sertraline (ZOLOFT) 25 MG tablet Take 1 tablet (25 mg total) by mouth daily. 03/15/16  Yes Beau Fanny, FNP  hydrOXYzine (ATARAX/VISTARIL) 25 MG tablet Take 1 tablet (25 mg total) by mouth every 6 (six) hours as needed for anxiety. 03/14/16    Beau Fanny, FNP  nicotine (NICODERM CQ - DOSED IN MG/24 HOURS) 21 mg/24hr patch Place 1 patch (21 mg total) onto the skin daily. Patient not taking: Reported on 06/25/2016 03/15/16   Beau Fanny, FNP  QUEtiapine (SEROQUEL) 200 MG tablet Take 1 tablet (200 mg total) by mouth at bedtime. Patient not taking: Reported on 09/01/2016 03/14/16   Beau Fanny, FNP  thiamine 100 MG tablet Take 1 tablet (100 mg total) by mouth daily. Patient not taking: Reported on 06/25/2016 03/15/16   Beau Fanny, FNP    Family History History reviewed. No pertinent family history.  Social History Social History  Substance Use Topics  . Smoking status: Current Every Day Smoker    Packs/day: 1.00    Types: Cigarettes  . Smokeless tobacco: Never Used  . Alcohol use No     Comment: "alot"- In [employer]     Allergies   Fish allergy and Penicillins   Review of Systems Review of Systems  All other systems reviewed and are negative.    Physical Exam Updated Vital Signs BP 120/81 (BP Location: Left Arm)   Pulse 90   Temp 98.3 F (36.8 C) (Oral)   Resp 20   Wt 250 lb (113.4 kg)   SpO2 100%   BMI 32.98 kg/m   Physical Exam  Constitutional: He is oriented to person, place, and time. He appears well-developed and well-nourished.  HENT:  Head: Normocephalic and atraumatic.  Right Ear: External ear  normal.  Left Ear: External ear normal.  Eyes: Conjunctivae and EOM are normal. Pupils are equal, round, and reactive to light.  Neck: Normal range of motion and phonation normal. Neck supple.  Cardiovascular: Normal rate, regular rhythm and normal heart sounds.   Pulmonary/Chest: Effort normal and breath sounds normal. He exhibits no bony tenderness.  Abdominal: Soft. There is no tenderness.  Musculoskeletal: Normal range of motion.  Neurological: He is alert and oriented to person, place, and time. No cranial nerve deficit or sensory deficit. He exhibits normal muscle tone. Coordination normal.   Skin: Skin is warm, dry and intact.  Psychiatric: He has a normal mood and affect. His behavior is normal. Judgment and thought content normal.  Nursing note and vitals reviewed.    ED Treatments / Results  Labs (all labs ordered are listed, but only abnormal results are displayed) Labs Reviewed  COMPREHENSIVE METABOLIC PANEL - Abnormal; Notable for the following:       Result Value   Sodium 134 (*)    Chloride 99 (*)    BUN 27 (*)    Creatinine, Ser 2.71 (*)    Total Protein 8.2 (*)    AST 50 (*)    ALT 107 (*)    GFR calc non Af Amer 28 (*)    GFR calc Af Amer 32 (*)    All other components within normal limits  CBC WITH DIFFERENTIAL/PLATELET - Abnormal; Notable for the following:    WBC 15.0 (*)    Neutro Abs 11.2 (*)    All other components within normal limits  CK - Abnormal; Notable for the following:    Total CK 450 (*)    All other components within normal limits  URINALYSIS, ROUTINE W REFLEX MICROSCOPIC - Abnormal; Notable for the following:    APPearance HAZY (*)    Hgb urine dipstick MODERATE (*)    Protein, ur 30 (*)    Squamous Epithelial / LPF 0-5 (*)    All other components within normal limits    EKG  EKG Interpretation None       Radiology No results found.  Procedures Procedures (including critical care time)  Medications Ordered in ED Medications  sodium chloride 0.9 % bolus 1,000 mL (1,000 mLs Intravenous New Bag/Given 09/01/16 2035)     Initial Impression / Assessment and Plan / ED Course  I have reviewed the triage vital signs and the nursing notes.  Pertinent labs & imaging results that were available during my care of the patient were reviewed by me and considered in my medical decision making (see chart for details).  Clinical Course as of Sep 01 2136  Thu Sep 01, 2016  2135 Elevated, likely myoglobin Hgb urine dipstick: (!) MODERATE [EW]  2136 Greater than 2 times baseline Creatinine: (!) 2.71 [EW]  2136 Elevated AST: (!) 50  [EW]  2136 Elevated ALT: (!) 107 [EW]  2136 Elevated WBC: (!) 15.0 [EW]    Clinical Course User Index [EW] Mancel Bale, MD    Medications  sodium chloride 0.9 % bolus 1,000 mL (1,000 mLs Intravenous New Bag/Given 09/01/16 2035)    Patient Vitals for the past 24 hrs:  BP Temp Temp src Pulse Resp SpO2 Weight  09/01/16 1904 120/81 98.3 F (36.8 C) Oral 90 20 100 % 250 lb (113.4 kg)    9:38 PM Reevaluation with update and discussion. After initial assessment and treatment, an updated evaluation reveals no change in clinical status.  Findings discussed with the  patient, all questions answered. Asianna Brundage L   9:39 PM-Consult complete with hospitalist. Patient case explained and discussed.  He agrees to admit patient for further evaluation and treatment. Call ended at 21: 50  Final Clinical Impressions(s) / ED Diagnoses   Final diagnoses:  Non-traumatic rhabdomyolysis  AKI (acute kidney injury) (HCC)  Heat stroke, initial encounter   Heat illness with secondary rhabdomyolysis, and acute kidney injury.  Nonspecific mild elevation of transaminases.  He will require additional treatment, and monitoring, prior to disposition.  Patient will be admitted for observation.   Nursing Notes Reviewed/ Care Coordinated Applicable Imaging Reviewed Interpretation of Laboratory Data incorporated into ED treatment   Plan: Admit  New Prescriptions New Prescriptions   No medications on file     Mancel Bale, MD 09/01/16 2158

## 2016-09-02 DIAGNOSIS — F1920 Other psychoactive substance dependence, uncomplicated: Secondary | ICD-10-CM

## 2016-09-02 DIAGNOSIS — I10 Essential (primary) hypertension: Secondary | ICD-10-CM

## 2016-09-02 DIAGNOSIS — N179 Acute kidney failure, unspecified: Principal | ICD-10-CM

## 2016-09-02 DIAGNOSIS — F1010 Alcohol abuse, uncomplicated: Secondary | ICD-10-CM

## 2016-09-02 DIAGNOSIS — F332 Major depressive disorder, recurrent severe without psychotic features: Secondary | ICD-10-CM

## 2016-09-02 DIAGNOSIS — R109 Unspecified abdominal pain: Secondary | ICD-10-CM

## 2016-09-02 DIAGNOSIS — F1120 Opioid dependence, uncomplicated: Secondary | ICD-10-CM

## 2016-09-02 DIAGNOSIS — M6282 Rhabdomyolysis: Secondary | ICD-10-CM

## 2016-09-02 LAB — CBC
HCT: 43.1 % (ref 39.0–52.0)
Hemoglobin: 14.9 g/dL (ref 13.0–17.0)
MCH: 30 pg (ref 26.0–34.0)
MCHC: 34.6 g/dL (ref 30.0–36.0)
MCV: 86.9 fL (ref 78.0–100.0)
PLATELETS: 267 10*3/uL (ref 150–400)
RBC: 4.96 MIL/uL (ref 4.22–5.81)
RDW: 12.6 % (ref 11.5–15.5)
WBC: 12.7 10*3/uL — ABNORMAL HIGH (ref 4.0–10.5)

## 2016-09-02 LAB — COMPREHENSIVE METABOLIC PANEL
ALBUMIN: 3.7 g/dL (ref 3.5–5.0)
ALK PHOS: 59 U/L (ref 38–126)
ALT: 88 U/L — AB (ref 17–63)
AST: 41 U/L (ref 15–41)
Anion gap: 9 (ref 5–15)
BUN: 26 mg/dL — AB (ref 6–20)
CALCIUM: 9 mg/dL (ref 8.9–10.3)
CHLORIDE: 103 mmol/L (ref 101–111)
CO2: 23 mmol/L (ref 22–32)
CREATININE: 2.07 mg/dL — AB (ref 0.61–1.24)
GFR calc Af Amer: 44 mL/min — ABNORMAL LOW (ref >60)
GFR calc non Af Amer: 38 mL/min — ABNORMAL LOW (ref >60)
GLUCOSE: 107 mg/dL — AB (ref 65–99)
Potassium: 3.3 mmol/L — ABNORMAL LOW (ref 3.5–5.1)
SODIUM: 135 mmol/L (ref 135–145)
Total Bilirubin: 1.1 mg/dL (ref 0.3–1.2)
Total Protein: 7 g/dL (ref 6.5–8.1)

## 2016-09-02 LAB — MAGNESIUM: MAGNESIUM: 2.1 mg/dL (ref 1.7–2.4)

## 2016-09-02 LAB — CREATININE, URINE, RANDOM: Creatinine, Urine: 158.99 mg/dL

## 2016-09-02 LAB — CK: CK TOTAL: 601 U/L — AB (ref 49–397)

## 2016-09-02 LAB — SODIUM, URINE, RANDOM: SODIUM UR: 44 mmol/L

## 2016-09-02 MED ORDER — HYDROCODONE-ACETAMINOPHEN 5-325 MG PO TABS
1.0000 | ORAL_TABLET | Freq: Four times a day (QID) | ORAL | Status: DC | PRN
  Administered 2016-09-02: 1 via ORAL
  Filled 2016-09-02: qty 1

## 2016-09-02 MED ORDER — MORPHINE SULFATE (PF) 4 MG/ML IV SOLN: 2.0000 mg | INTRAVENOUS | Status: DC | PRN

## 2016-09-02 MED ORDER — POTASSIUM CHLORIDE CRYS ER 20 MEQ PO TBCR
40.0000 meq | EXTENDED_RELEASE_TABLET | Freq: Once | ORAL | Status: AC
  Administered 2016-09-02: 09:00:00 40 meq via ORAL
  Filled 2016-09-02: qty 2

## 2016-09-02 NOTE — Progress Notes (Signed)
CSW consulted to assist with PCP and medications. CSW is unable to assist with this request. Please consult RNCM.  CSW signing off.  Cori Razor LCSW [phone]

## 2016-09-02 NOTE — Progress Notes (Signed)
Report given to [facility] on 5E. Pt to be transferred to 1502.

## 2016-09-02 NOTE — Progress Notes (Signed)
PROGRESS NOTE    Todd [name]  ZOX:[id number] DOB: Oct 12, 1975 DOA: 09/01/2016 PCP: No PCP Per Patient   Chief Complaint  Patient presents with  . Flank Pain    Brief Narrative:  HPI On 09/01/2016 by Dr. Lorretta Harp Todd [name] is a 41 y.o. male with medical history significant of hypertension, depression, HCV, tobacco abuse, PTSD, polysubstance abuse including alcohol and cocaine, who presents with left flank pain, muscle cramps and dark urine.  Pt states that he started having left flank pain, and muscle cramps in left flank and lower abdomen. His flank pain is constant, 6 out of 10 in severity, dull, nonradiating. Denies any injury. He noticed dark urine, but denies dysuria or burning on urination. No urinary frequency. He has nausea, but no vomiting or diarrhea. No fever or chills. Denies chest pain, SOB, cough or unilateral weakness. 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 He states that he did not drink alcohol in the past two weeks. Assessment & Plan   Acute kidney injury with mild rhabdomyolysis -Upon admission, creatinine was 2.71, Baseline 1.1-1.29 -CK level 601 -Likely secondary to strenuous activity complicated by medication (patient noted to be on ACEi, however, admits to taking friend's BP medication- Cozaar/HCTZ -CT renal study unremarkable -Creatinine currently 2.07 -Continue IVF and monitor BMP/CK level  Polysubstance abuse -Patient admits to using many different illicit substances in the past as well as alcohol and tobacco -Tox screen negative -Admits to using Suboxone (not by prescription) -Discussed cessation  -Continue nicotine patch  Depression/Mood disorder -Continue zoloft -Seroquel held  Essential hypertension -Currently controlled -Lisinopril held (although patient ran out of this medication) -Continue propanolol  Leukocytosis -likely reactive -UA unremarkable for infection; no  respiratory complaints -Afebrile  History of hepatitis C -HCV Ab reactive in 2014  DVT Prophylaxis  Lovenox  Code Status: Full  Family Communication: None at bedside  Disposition Plan: Observation. Continue IVF and monitor CK/Creatinine  Consultants None  Procedures  None  Antibiotics   Anti-infectives    None      Subjective:   [name] seen and examined today.  Patient states he is feeling better. Denies further abdominal pain/flank pain. Denies muscle cramps. Currently denies chest pain, shortness of breath, dizziness, headache.  Objective:   Vitals:   09/01/16 2345 09/02/16 0515 09/02/16 0900 09/02/16 1125  BP: 124/71 119/65 129/81 (!) 150/95  Pulse: 72 66 65 62  Resp: Temp: 98.3 F (36.8 C) 98 F (36.7 C) 98.1 F (36.7 C) 97.5 F (36.4 C)  TempSrc: Oral Oral Oral Oral  SpO2: 98% 98% 99% 97%  Weight: 113.4 kg (250 lb)     Height:  (1.854 m)       Intake/Output Summary (Last 24 hours) at 09/02/16 1323 Last data filed at 09/02/16 0900  Gross per 24 hour  Intake             2440 ml  Output                0 ml  Net             2440 ml   Filed Weights   09/01/16 1904 09/01/16 2345  Weight: 113.4 kg (250 lb) 113.4 kg (250 lb)    Exam  General: Well developed, well nourished, NAD, appears stated age  HEENT: NCAT,  mucous membranes moist.   Cardiovascular: S1 S2 auscultated, no rubs, murmurs  or gallops. Regular rate and rhythm.  Respiratory: Clear to auscultation bilaterally with equal chest rise  Abdomen: Soft, nontender, nondistended, + bowel sounds  Extremities: warm dry without cyanosis clubbing or edema  Neuro: AAOx3, nonfocal  Skin: Without rashes exudates or nodules, multiple tattoos   Psych: Normal affect and demeanor with intact judgement and insight   Data Reviewed: I have personally reviewed following labs and imaging studies  CBC:  Recent Labs Lab 09/01/16 2037 09/02/16 0410  WBC 15.0* 12.7*    NEUTROABS 11.2*  --   HGB 16.0 14.9  HCT 44.9 43.1  MCV 84.2 86.9  PLT 305 267   Basic Metabolic Panel:  Recent Labs Lab 09/01/16 2037 09/02/16 0410  NA 134* 135  K 4.0 3.3*  CL 99* 103  CO2 24 23  GLUCOSE 98 107*  BUN 27* 26*  CREATININE 2.71* 2.07*  CALCIUM 9.8 9.0  MG  --  2.1   GFR: Estimated Creatinine Clearance: 62.6 mL/min (A) (by C-G formula based on SCr of 2.07 mg/dL (H)). Liver Function Tests:  Recent Labs Lab 09/01/16 2037 09/02/16 0410  AST 50* 41  ALT 107* 88*  ALKPHOS 67 59  BILITOT 0.8 1.1  PROT 8.2* 7.0  ALBUMIN 4.4 3.7    Recent Labs Lab 09/01/16 2306  LIPASE 27   No results for input(s): AMMONIA in the last 168 hours. Coagulation Profile: No results for input(s): INR, PROTIME in the last 168 hours. Cardiac Enzymes:  Recent Labs Lab 09/01/16 2037 09/02/16 0410  CKTOTAL 450* 601*   BNP (last 3 results) No results for input(s): PROBNP in the last 8760 hours. HbA1C: No results for input(s): HGBA1C in the last 72 hours. CBG: No results for input(s): GLUCAP in the last 168 hours. Lipid Profile: No results for input(s): CHOL, HDL, LDLCALC, TRIG, CHOLHDL, LDLDIRECT in the last 72 hours. Thyroid Function Tests: No results for input(s): TSH, T4TOTAL, FREET4, T3FREE, THYROIDAB in the last 72 hours. Anemia Panel: No results for input(s): VITAMINB12, FOLATE, FERRITIN, TIBC, IRON, RETICCTPCT in the last 72 hours. Urine analysis:    Component Value Date/Time   COLORURINE YELLOW 09/01/2016 2023   APPEARANCEUR HAZY (A) 09/01/2016 2023   LABSPEC 1.011 09/01/2016 2023   PHURINE 5.0 09/01/2016 2023   GLUCOSEU NEGATIVE 09/01/2016 2023   HGBUR MODERATE (A) 09/01/2016 2023   BILIRUBINUR NEGATIVE 09/01/2016 2023   KETONESUR NEGATIVE 09/01/2016 2023   PROTEINUR 30 (A) 09/01/2016 2023   NITRITE NEGATIVE 09/01/2016 2023   LEUKOCYTESUR NEGATIVE 09/01/2016 2023   Sepsis Labs: (procalcitonin:4,lacticidven:4)  )No results found for  this or any previous visit (from the past 240 hour(s)).    Radiology Studies: Ct Renal Stone Study  Result Date: 09/01/2016 CLINICAL DATA:  Left flank pain. Cramping, nausea, in weakness starting today at work. EXAM: CT ABDOMEN AND PELVIS WITHOUT CONTRAST TECHNIQUE: Multidetector CT imaging of the abdomen and pelvis was performed following the standard protocol without IV contrast. COMPARISON:  None. FINDINGS: Lower chest: The lung bases are clear. Hepatobiliary: No focal liver abnormality is seen. No gallstones, gallbladder wall thickening, or biliary dilatation. Pancreas: Unremarkable. No pancreatic ductal dilatation or surrounding inflammatory changes. Spleen: Normal in size without focal abnormality. Adrenals/Urinary Tract: Adrenal glands are unremarkable. Kidneys are normal, without renal calculi, focal lesion, or hydronephrosis. Bladder is unremarkable. Stomach/Bowel: Stomach is within normal limits. Appendix appears normal. No evidence of bowel wall thickening, distention, or inflammatory changes. Vascular/Lymphatic: No significant vascular findings are present. No enlarged abdominal or pelvic lymph nodes. Reproductive: Prostate  gland is mildly enlarged, measuring 4.2 cm diameter. Other: Small periumbilical hernia containing fat. No free air or free fluid in the abdomen. Musculoskeletal: No acute or significant osseous findings. IMPRESSION: No acute process demonstrated in the abdomen or pelvis. No renal or ureteral stone or obstruction. Electronically Signed   By: Burman Nieves M.D.   On: 09/01/2016 22:25     Scheduled Meds: . enoxaparin (LOVENOX) injection  40 mg Subcutaneous QHS  . nicotine  21 mg Transdermal Daily  . propranolol  20 mg Oral BID  . sertraline  25 mg Oral Daily   Continuous Infusions: . sodium chloride 150 mL/hr at 09/02/16 1130     LOS: 0 days   Time Spent in minutes   30 minutes  Isreal Moline D.O. on 09/02/2016 at 1:23 PM  Between 7am to 7pm - Pager -  [phone]  After 7pm go to [email or web address] - password [email or web address]  And look for the night coverage person covering for me after hours  [facility] Office  [phone]

## 2016-09-02 NOTE — Progress Notes (Signed)
[id number] Malyk Girouard,BSN,RN3,CCM: Information abut [facility] given to patient to call upon dc for f/u appt and added information to dc summary.

## 2016-09-03 DIAGNOSIS — T670XXA Heatstroke and sunstroke, initial encounter: Secondary | ICD-10-CM

## 2016-09-03 DIAGNOSIS — F419 Anxiety disorder, unspecified: Secondary | ICD-10-CM

## 2016-09-03 LAB — BASIC METABOLIC PANEL
ANION GAP: 5 (ref 5–15)
BUN: 15 mg/dL (ref 6–20)
CHLORIDE: 109 mmol/L (ref 101–111)
CO2: 27 mmol/L (ref 22–32)
Calcium: 8.7 mg/dL — ABNORMAL LOW (ref 8.9–10.3)
Creatinine, Ser: 1.3 mg/dL — ABNORMAL HIGH (ref 0.61–1.24)
GFR calc Af Amer: >60 mL/min (ref >60)
Glucose, Bld: 109 mg/dL — ABNORMAL HIGH (ref 65–99)
POTASSIUM: 4.7 mmol/L (ref 3.5–5.1)
SODIUM: 141 mmol/L (ref 135–145)

## 2016-09-03 LAB — CBC
HCT: 43.9 % (ref 39.0–52.0)
HEMOGLOBIN: 15 g/dL (ref 13.0–17.0)
MCH: 30.4 pg (ref 26.0–34.0)
MCHC: 34.2 g/dL (ref 30.0–36.0)
MCV: 89 fL (ref 78.0–100.0)
PLATELETS: 236 10*3/uL (ref 150–400)
RBC: 4.93 MIL/uL (ref 4.22–5.81)
RDW: 12.6 % (ref 11.5–15.5)
WBC: 8.4 10*3/uL (ref 4.0–10.5)

## 2016-09-03 LAB — CK: CK TOTAL: 474 U/L — AB (ref 49–397)

## 2016-09-03 LAB — HIV ANTIBODY (ROUTINE TESTING W REFLEX): HIV Screen 4th Generation wRfx: NONREACTIVE

## 2016-09-03 MED ORDER — ALPRAZOLAM 0.5 MG PO TABS: 0.5000 mg | ORAL_TABLET | Freq: Two times a day (BID) | ORAL | 0 refills | Status: DC | PRN

## 2016-09-03 MED ORDER — QUETIAPINE FUMARATE 200 MG PO TABS: 200.0000 mg | ORAL_TABLET | Freq: Every day | ORAL | 0 refills | Status: DC

## 2016-09-03 MED ORDER — HYDROXYZINE HCL 25 MG PO TABS
25.0000 mg | ORAL_TABLET | Freq: Four times a day (QID) | ORAL | 0 refills | Status: DC | PRN
Start: 2016-09-03 — End: 2017-04-06

## 2016-09-03 MED ORDER — NICOTINE 21 MG/24HR TD PT24: 21.0000 mg | MEDICATED_PATCH | Freq: Every day | TRANSDERMAL | 0 refills | Status: DC

## 2016-09-03 MED ORDER — THIAMINE HCL 100 MG PO TABS: 100.0000 mg | ORAL_TABLET | Freq: Every day | ORAL | 0 refills | Status: DC

## 2016-09-03 MED ORDER — AMLODIPINE BESYLATE 5 MG PO TABS: 5.0000 mg | ORAL_TABLET | Freq: Every day | ORAL | 0 refills | Status: DC

## 2016-09-03 MED ORDER — PROPRANOLOL HCL 20 MG PO TABS: 20.0000 mg | ORAL_TABLET | Freq: Two times a day (BID) | ORAL | 0 refills | Status: DC

## 2016-09-03 NOTE — Discharge Summary (Signed)
Physician Discharge Summary  Todd [name] UJW:[id number] DOB: 1976/02/21 DOA: 09/01/2016  PCP: No PCP Per Patient  Admit date: 09/01/2016 Discharge date: 09/03/2016  Time spent: 45 minutes  Recommendations for Outpatient Follow-up:  Patient will be discharged to home.  Patient will need to follow up with primary care provider within one week of discharge, repeat BMP.  Patient should continue medications as prescribed.  Patient should follow a heart healthy diet.   Discharge Diagnoses:  Acute kidney injury with mild rhabdomyolysis Polysubstance abuse Depression/Mood disorder Essential hypertension Leukocytosis History of hepatitis C  Discharge Condition: Stable  Diet recommendation: heart healthy  Filed Weights   09/01/16 1904 09/01/16 2345  Weight: 113.4 kg (250 lb) 113.4 kg (250 lb)    History of present illness:  On 09/01/2016 by Dr. Susa Loffler a 40 y.o.malewith medical history significant of hypertension, depression, HCV, tobacco abuse, PTSD, polysubstance abuse including alcohol and cocaine, who presents with left flank pain, muscle cramps and dark urine.  Pt states that he started having left flank pain, and muscle cramps in left flank and lower abdomen. His flank pain isconstant, 6 out of 10 in severity, dull, nonradiating. Denies any injury. He noticed dark urine, but denies dysuria or burning on urination. No urinary frequency. He has nausea, but no vomiting or diarrhea. No fever or chills. Denies chest pain, SOB,cough or unilateral weakness. 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 He states that he did not drink alcohol in the past two weeks.  Hospital Course:  Acute kidney injury with mild rhabdomyolysis -Upon admission, creatinine was 2.71, Baseline 1.1-1.29 -CK level 601 -Likely secondary to strenuous activity complicated by medication (patient noted to be on ACEi, however, admits  to taking friend's BP medication- Cozaar/HCTZ -CT renal study unremarkable -Creatinine currently 1.3 -repeat BMP in one week  Polysubstance abuse -Patient admits to using many different illicit substances in the past as well as alcohol and tobacco -Tox screen negative -Admits to using Suboxone (not by prescription) -Discussed cessation  -Continue nicotine patch  Depression/Mood disorder -Continue zoloft -Seroquel held- may continue upon discharge -Anxiety- continue anxiolytic PRN  Essential hypertension -Currently controlled -Lisinopril held (although patient ran out of this medication) -Continue propanolol -Prescribed amlodipine  Leukocytosis -likely reactive, resolved -UA unremarkable for infection; no respiratory complaints -Afebrile  History of hepatitis C -HCV Ab reactive in 2014  Procedures: None  Consultations: None  Discharge Exam: Vitals:   09/02/16 2105 09/03/16 0505  BP: (!) 150/88 137/67  Pulse: 64 60  Resp: 20 16  Temp: 98.7 F (37.1 C) 98.3 F (36.8 C)   Patient states he is feeling better today. Denies chest pain, shortness of breath, abdominal pain.   Exam  General: Well developed, well nourished, NAD  HEENT: NCAT,  mucous membranes moist.   Cardiovascular: S1 S2 auscultated, RRR, no murmurs  Respiratory: Clear to auscultation bilaterally   Abdomen: Soft, nontender, nondistended, + bowel sounds  Extremities: warm dry without cyanosis clubbing or edema  Neuro: AAOx3, nonfocal  Skin: Without rashes exudates or nodules, multiple tattoos   Psych: Normal affect and demeanor, pleasant  Discharge Instructions Discharge Instructions    Discharge instructions    Complete by:  As directed    Patient will be discharged to home.  Patient will need to follow up with primary care provider within one week of discharge, repeat BMP.  Patient should continue medications as prescribed.  Patient should follow a  heart healthy diet.      Current Discharge Medication List    START taking these medications   Details  ALPRAZolam (XANAX) 0.5 MG tablet Take 1 tablet (0.5 mg total) by mouth 2 (two) times daily as needed for anxiety. Qty: 60 tablet, Refills: 0    amLODipine (NORVASC) 5 MG tablet Take 1 tablet (5 mg total) by mouth daily. Qty: 30 tablet, Refills: 0      CONTINUE these medications which have CHANGED   Details  hydrOXYzine (ATARAX/VISTARIL) 25 MG tablet Take 1 tablet (25 mg total) by mouth every 6 (six) hours as needed for anxiety. Qty: 30 tablet, Refills: 0    nicotine (NICODERM CQ - DOSED IN MG/24 HOURS) 21 mg/24hr patch Place 1 patch (21 mg total) onto the skin daily. Qty: 28 patch, Refills: 0    propranolol (INDERAL) 20 MG tablet Take 1 tablet (20 mg total) by mouth 2 (two) times daily. Qty: 60 tablet, Refills: 0    QUEtiapine (SEROQUEL) 200 MG tablet Take 1 tablet (200 mg total) by mouth at bedtime. Qty: 30 tablet, Refills: 0    thiamine 100 MG tablet Take 1 tablet (100 mg total) by mouth daily. Qty: 30 tablet, Refills: 0      CONTINUE these medications which have NOT CHANGED   Details  sertraline (ZOLOFT) 25 MG tablet Take 1 tablet (25 mg total) by mouth daily. Qty: 30 tablet, Refills: 0      STOP taking these medications     ibuprofen (ADVIL,MOTRIN) 200 MG tablet      lisinopril (PRINIVIL,ZESTRIL) 10 MG tablet        Allergies  Allergen Reactions  . Fish Allergy Anaphylaxis  . Penicillins Anaphylaxis   Follow-up Information    [facility]. Call.   Why:  [phone] on 09/06/2016/Tuesday am at 0900-for a hospital follow up appointment Contact information: [address] [place] [address] [phone]           The results of significant diagnostics from this hospitalization (including imaging, microbiology, ancillary and laboratory) are listed below for reference.    Significant Diagnostic Studies: Ct Renal Stone  Study  Result Date: 09/01/2016 CLINICAL DATA:  Left flank pain. Cramping, nausea, in weakness starting today at work. EXAM: CT ABDOMEN AND PELVIS WITHOUT CONTRAST TECHNIQUE: Multidetector CT imaging of the abdomen and pelvis was performed following the standard protocol without IV contrast. COMPARISON:  None. FINDINGS: Lower chest: The lung bases are clear. Hepatobiliary: No focal liver abnormality is seen. No gallstones, gallbladder wall thickening, or biliary dilatation. Pancreas: Unremarkable. No pancreatic ductal dilatation or surrounding inflammatory changes. Spleen: Normal in size without focal abnormality. Adrenals/Urinary Tract: Adrenal glands are unremarkable. Kidneys are normal, without renal calculi, focal lesion, or hydronephrosis. Bladder is unremarkable. Stomach/Bowel: Stomach is within normal limits. Appendix appears normal. No evidence of bowel wall thickening, distention, or inflammatory changes. Vascular/Lymphatic: No significant vascular findings are present. No enlarged abdominal or pelvic lymph nodes. Reproductive: Prostate gland is mildly enlarged, measuring 4.2 cm diameter. Other: Small periumbilical hernia containing fat. No free air or free fluid in the abdomen. Musculoskeletal: No acute or significant osseous findings. IMPRESSION: No acute process demonstrated in the abdomen or pelvis. No renal or ureteral stone or obstruction. Electronically Signed   By: Burman Nieves M.D.   On: 09/01/2016 22:25    Microbiology: No results found for this or any previous visit (from the past 240 hour(s)).   Labs: Basic Metabolic Panel:  Recent  Labs Lab 09/01/16 2037 09/02/16 0410 09/03/16 0605  NA 134* 135 141  K 4.0 3.3* 4.7  CL 99* 103 109  CO2 GLUCOSE 98 107* 109*  BUN 27* 26* 15  CREATININE 2.71* 2.07* 1.30*  CALCIUM 9.8 9.0 8.7*  MG  --  2.1  --    Liver Function Tests:  Recent Labs Lab 09/01/16 2037 09/02/16 0410  AST 50* 41  ALT 107* 88*  ALKPHOS 67 59   BILITOT 0.8 1.1  PROT 8.2* 7.0  ALBUMIN 4.4 3.7    Recent Labs Lab 09/01/16 2306  LIPASE 27   No results for input(s): AMMONIA in the last 168 hours. CBC:  Recent Labs Lab 09/01/16 2037 09/02/16 0410 09/03/16 0605  WBC 15.0* 12.7* 8.4  NEUTROABS 11.2*  --   --   HGB 16.0 14.9 15.0  HCT 44.9 43.1 43.9  MCV 84.2 86.9 89.0  PLT 305 267 236   Cardiac Enzymes:  Recent Labs Lab 09/01/16 2037 09/02/16 0410 09/03/16 0605  CKTOTAL 450* 601* 474*   BNP: BNP (last 3 results) No results for input(s): BNP in the last 8760 hours.  ProBNP (last 3 results) No results for input(s): PROBNP in the last 8760 hours.  CBG: No results for input(s): GLUCAP in the last 168 hours.     SignedEdsel Petrin  [facility] 09/03/2016, 10:18 AM

## 2016-09-03 NOTE — Care Management Note (Signed)
Case Management Note  Patient Details  Name: Todd [name] MRN: [id number] Date of Birth: [birth year]  Subjective/Objective:         AKI, Rhabdomyolysis, Polysubstance abuse           Action/Plan: Discharge Planning: NCM spoke to pt and states he works. He could afford his medications at the pharmacy. Pt to contact [facility] to arrange follow up appt. Explained to pt he could utilize the [facility] for discounted meds at pharmacy. The financial counselor could also assist him with [facility] eligibility    Expected Discharge Date:  09/03/16               Expected Discharge Plan:  Home/Self Care  In-House Referral:  NA  Discharge planning Services  CM Consult, Medication Assistance  Post Acute Care Choice:  NA Choice offered to:  NA  DME Arranged:  N/A DME Agency:  NA  HH Arranged:  NA HH Agency:  NA  Status of Service:  Completed, signed off  If discussed at Long Length of Stay Meetings, dates discussed:    Additional Comments:  Elliot Cousin, RN 09/03/2016, 10:46 AM

## 2016-09-03 NOTE — Progress Notes (Signed)
Pt leaving this morning; alert and oriented. Discharge instructions/prescriptions given/explained with pt verbalizing understanding. Pt aware of followup visit. Pt left with cell phone and bag.

## 2016-09-03 NOTE — Discharge Instructions (Signed)
Acute Kidney Injury, Adult Acute kidney injury is a sudden worsening of kidney function. The kidneys are organs that have several jobs. They filter the blood to remove waste products and extra fluid. They also maintain a healthy balance of minerals and hormones in the body, which helps control blood pressure and keep bones strong. With this condition, your kidneys do not do their jobs as well as they should. This condition ranges from mild to severe. Over time it may develop into long-lasting (chronic) kidney disease. Early detection and treatment may prevent acute kidney injury from developing into a chronic condition. What are the causes? Common causes of this condition include:  A problem with blood flow to the kidneys. This may be caused by:  Low blood pressure (hypotension) or shock.  Blood loss.  Heart and blood vessel (cardiovascular) disease.  Severe burns.  Liver disease.  Direct damage to the kidneys. This may be caused by:  Certain medicines.  A kidney infection.  Poisoning.  Being around or in contact with toxic substances.  A surgical wound.  A hard, direct hit to the kidney area.  A sudden blockage of urine flow. This may be caused by:  Cancer.  Kidney stones.  An enlarged prostate in males. What are the signs or symptoms? Symptoms of this condition may not be obvious until the condition becomes severe. Symptoms of this condition can include:  Tiredness (lethargy), or difficulty staying awake.  Nausea or vomiting.  Swelling (edema) of the face, legs, ankles, or feet.  Problems with urination, such as:  Abdominal pain, or pain along the side of your stomach (flank).  Decreased urine production.  Decrease in the force of urine flow.  Muscle twitches and cramps, especially in the legs.  Confusion or trouble concentrating.  Loss of appetite.  Fever. How is this diagnosed? This condition may be diagnosed with tests, including:  Blood  tests.  Urine tests.  Imaging tests.  A test in which a sample of tissue is removed from the kidneys to be examined under a microscope (kidney biopsy). How is this treated? Treatment for this condition depends on the cause and how severe the condition is. In mild cases, treatment may not be needed. The kidneys may heal on their own. In more severe cases, treatment will involve:  Treating the cause of the kidney injury. This may involve changing any medicines you are taking or adjusting your dosage.  Fluids. You may need specialized IV fluids to balance your body's needs.  Having a catheter placed to drain urine and prevent blockages.  Preventing problems from occurring. This may mean avoiding certain medicines or procedures that can cause further injury to the kidneys. In some cases treatment may also require:  A procedure to remove toxic wastes from the body (dialysis or continuous renal replacement therapy - CRRT).  Surgery. This may be done to repair a torn kidney, or to remove the blockage from the urinary system. Follow these instructions at home: Medicines   Take over-the-counter and prescription medicines only as told by your health care provider.  Do not take any new medicines without your health care provider's approval. Many medicines can worsen your kidney damage.  Do not take any vitamin and mineral supplements without your health care provider's approval. Many nutritional supplements can worsen your kidney damage. Lifestyle   If your health care provider prescribed changes to your diet, follow them. You may need to decrease the amount of protein you eat.  Achieve and maintain a   healthy weight. If you need help with this, ask your health care provider.  Start or continue an exercise plan. Try to exercise at least 30 minutes a day, 5 days a week.  Do not use any tobacco products, such as cigarettes, chewing tobacco, and e-cigarettes. If you need help quitting, ask  your health care provider. General instructions   Keep track of your blood pressure. Report changes in your blood pressure as told by your health care provider.  Stay up to date with immunizations. Ask your health care provider which immunizations you need.  Keep all follow-up visits as told by your health care provider. This is important. Where to find more information:  American Association of Kidney Patients: www.aakp.org  National Kidney Foundation: www.kidney.org  American Kidney Fund: www.akfinc.org  [employer] [facility]:  www.lifeoptions.org  www.kidneyschool.org Contact a health care provider if:  Your symptoms get worse.  You develop new symptoms. Get help right away if:  You develop symptoms of worsening kidney disease, which include:  Headaches.  Abnormally dark or light skin.  Easy bruising.  Frequent hiccups.  Chest pain.  Shortness of breath.  End of menstruation in women.  Seizures.  Confusion or altered mental status.  Abdominal or back pain.  Itchiness.  You have a fever.  Your body is producing less urine.  You have pain or bleeding when you urinate. Summary  Acute kidney injury is a sudden worsening of kidney function.  Acute kidney injury can be caused by problems with blood flow to the kidneys, direct damage to the kidneys, and sudden blockage of urine flow.  Symptoms of this condition may not be obvious until it becomes severe. Symptoms may include edema, lethargy, confusion, nausea or vomiting, and problems passing urine.  This condition can usually be diagnosed with blood tests, urine tests, and imaging tests. Sometimes a kidney biopsy is done to diagnose this condition.  Treatment for this condition often involves treating the underlying cause. It is treated with fluids, medicines, dialysis, diet changes, or surgery. This information is not intended to replace advice given to you by your health care provider.  Make sure you discuss any questions you have with your health care provider. Document Released: 11/15/2010 Document Revised: 04/22/2016 Document Reviewed: 04/22/2016 Elsevier Interactive Patient Education  2017 [employer]  

## 2016-09-09 ENCOUNTER — Ambulatory Visit: Payer: Self-pay

## 2016-09-22 ENCOUNTER — Inpatient Hospital Stay: Payer: Self-pay

## 2016-09-22 NOTE — Progress Notes (Deleted)
Patient ID: Francena HanlyRobert Clinger, male   DOB: 01/24/1976, 41 y.o.   MRN: [id number] after being hospitalized 09/01/2016-09/03/2016 for AKI with rhabdomyolysis after presenting to the ED with L flank pain, muscle cramps, and dark urine.  Medical history significant of hypertension, depression, HCV, tobacco abuse, PTSD, polysubstance abuse including alcohol and cocaine and currently had been taking suboxone off the street.  UDS negative this hospitalization.   CT renal study unremarkable.

## 2017-02-05 ENCOUNTER — Emergency Department (HOSPITAL_COMMUNITY): Payer: No Typology Code available for payment source

## 2017-02-05 ENCOUNTER — Emergency Department (HOSPITAL_COMMUNITY)
Admission: EM | Admit: 2017-02-05 | Discharge: 2017-02-05 | Disposition: A | Discharge status: Home / Self Care | Payer: No Typology Code available for payment source | Attending: Emergency Medicine | Admitting: Emergency Medicine

## 2017-02-05 ENCOUNTER — Encounter (HOSPITAL_COMMUNITY): Payer: Self-pay | Admitting: *Deleted

## 2017-02-05 DIAGNOSIS — I10 Essential (primary) hypertension: Secondary | ICD-10-CM | POA: Diagnosis not present

## 2017-02-05 DIAGNOSIS — Y929 Unspecified place or not applicable: Secondary | ICD-10-CM | POA: Diagnosis not present

## 2017-02-05 DIAGNOSIS — Y939 Activity, unspecified: Secondary | ICD-10-CM | POA: Diagnosis not present

## 2017-02-05 DIAGNOSIS — S4992XA Unspecified injury of left shoulder and upper arm, initial encounter: Secondary | ICD-10-CM | POA: Insufficient documentation

## 2017-02-05 DIAGNOSIS — Y92410 Unspecified street and highway as the place of occurrence of the external cause: Secondary | ICD-10-CM | POA: Insufficient documentation

## 2017-02-05 DIAGNOSIS — F17210 Nicotine dependence, cigarettes, uncomplicated: Secondary | ICD-10-CM | POA: Insufficient documentation

## 2017-02-05 DIAGNOSIS — Z79899 Other long term (current) drug therapy: Secondary | ICD-10-CM | POA: Diagnosis not present

## 2017-02-05 DIAGNOSIS — Y998 Other external cause status: Secondary | ICD-10-CM | POA: Insufficient documentation

## 2017-02-05 MED ORDER — LISINOPRIL 10 MG PO TABS: 10.0000 mg | ORAL_TABLET | Freq: Every day | ORAL | 3 refills | Status: DC

## 2017-02-05 MED ORDER — MELOXICAM 15 MG PO TABS: 15.0000 mg | ORAL_TABLET | Freq: Every day | ORAL | 0 refills | Status: DC

## 2017-02-05 MED ORDER — PROPRANOLOL HCL 20 MG PO TABS: 20.0000 mg | ORAL_TABLET | Freq: Two times a day (BID) | ORAL | 3 refills | Status: DC

## 2017-02-05 NOTE — Discharge Instructions (Signed)
°  Come back to the ER if: Your arm, hand, or fingers: Tingle. Become numb. Become swollen. Become painful. Turn white or blue.

## 2017-02-05 NOTE — ED Notes (Signed)
ED Provider at bedside. 
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
## 2017-02-05 NOTE — Progress Notes (Signed)
Orthopedic Tech Progress Note Patient Details:  Todd [name] 06-24-1975 [id number]  Ortho Devices Type of Ortho Device: Sling immobilizer Ortho Device/Splint Interventions: Application   Saul Fordyce 02/05/2017, 1:44 PM
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
## 2017-04-06 ENCOUNTER — Encounter (HOSPITAL_COMMUNITY): Payer: Self-pay | Admitting: *Deleted

## 2017-04-06 ENCOUNTER — Emergency Department (HOSPITAL_COMMUNITY)
Admission: EM | Admit: 2017-04-06 | Discharge: 2017-04-07 | Discharge status: Against Medical Advice | Payer: Self-pay | Attending: Emergency Medicine | Admitting: Emergency Medicine

## 2017-04-06 ENCOUNTER — Other Ambulatory Visit: Payer: Self-pay

## 2017-04-06 DIAGNOSIS — R251 Tremor, unspecified: Secondary | ICD-10-CM | POA: Insufficient documentation

## 2017-04-06 DIAGNOSIS — Z79899 Other long term (current) drug therapy: Secondary | ICD-10-CM | POA: Insufficient documentation

## 2017-04-06 DIAGNOSIS — F419 Anxiety disorder, unspecified: Secondary | ICD-10-CM | POA: Insufficient documentation

## 2017-04-06 DIAGNOSIS — F17210 Nicotine dependence, cigarettes, uncomplicated: Secondary | ICD-10-CM | POA: Insufficient documentation

## 2017-04-06 DIAGNOSIS — R002 Palpitations: Secondary | ICD-10-CM | POA: Insufficient documentation

## 2017-04-06 DIAGNOSIS — F1120 Opioid dependence, uncomplicated: Secondary | ICD-10-CM | POA: Insufficient documentation

## 2017-04-06 DIAGNOSIS — I10 Essential (primary) hypertension: Secondary | ICD-10-CM | POA: Insufficient documentation

## 2017-04-06 MED ORDER — LORAZEPAM 2 MG/ML IJ SOLN
1.0000 mg | Freq: Once | INTRAMUSCULAR | Status: AC
  Administered 2017-04-06: 1 mg via INTRAMUSCULAR
  Filled 2017-04-06: qty 1

## 2017-04-06 NOTE — ED Provider Notes (Signed)
[facility]   CSN: [id number] Arrival date & time: 04/06/17  1834     History   Chief Complaint Chief Complaint  Patient presents with  . possible overdose    HPI Todd [name] is a 41 y.o. male.  HPI Todd [name] is a 41 y.o. male with hx of hypertension, hepatitis C, alcohol abuse, polysubstance abuse, presents to emergency department complaining of anxiety.  Patient states that he used heroin approximately 1 hour ago.  Patient states that shortly after using it he started having anxiety, sensation of skin crawling, jitteriness, palpitations.  He denies any chest pain.  No shortness of breath.  He states this has never happened before with heroin use.  Denies any other drugs today.  No alcohol today.  He does take methadone daily.  He states that he got his hearing from the same place, however was a different person.  No other complaints.  Past Medical History:  Diagnosis Date  . Hepatitis C   . Hypertension     Patient Active Problem List   Diagnosis Date Noted  . AKI (acute kidney injury) (HCC) 09/01/2016  . Rhabdomyolysis 09/01/2016  . Essential hypertension 09/01/2016  . Left flank pain   . Major depressive disorder, 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  . Polysubstance (including opioids) dependence with physiological dependence (HCC) 03/10/2016  . Substance induced mood disorder (HCC) 02/26/2013  . PTSD (post-traumatic stress disorder) 02/26/2013  . Polysubstance dependence including opioid type drug, episodic abuse (HCC) 02/24/2013  . Alcohol abuse 02/24/2013    Past Surgical History:  Procedure Laterality Date  . TESTICLE TORSION REDUCTION         Home Medications    Prior to Admission medications   Medication Sig Start Date End Date Taking? Authorizing Provider  ALPRAZolam Prudy Feeler(XANAX) 0.5 MG tablet Take 1 tablet (0.5 mg total) by mouth 2 (two) times daily as needed for  anxiety. Patient not taking: Reported on 02/05/2017 09/03/16   Edsel PetrinMikhail, Maryann, DO  hydrOXYzine (ATARAX/VISTARIL) 25 MG tablet Take 1 tablet (25 mg total) by mouth every 6 (six) hours as needed for anxiety. Patient not taking: Reported on 02/05/2017 09/03/16   Edsel PetrinMikhail, Maryann, DO  lisinopril (PRINIVIL,ZESTRIL) 10 MG tablet Take 1 tablet (10 mg total) by mouth daily. 02/05/17   Arthor CaptainHarris, Abigail, PA-C  meloxicam (MOBIC) 15 MG tablet Take 1 tablet (15 mg total) by mouth daily. 02/05/17   Arthor CaptainHarris, Abigail, PA-C  propranolol (INDERAL) 20 MG tablet Take 1 tablet (20 mg total) by mouth 2 (two) times daily. 02/05/17   Harris, Abigail, PA-C  QUEtiapine (SEROQUEL) 200 MG tablet Take 1 tablet (200 mg total) by mouth at bedtime. Patient not taking: Reported on 02/05/2017 09/03/16   Edsel PetrinMikhail, Maryann, DO  thiamine 100 MG tablet Take 1 tablet (100 mg total) by mouth daily. Patient not taking: Reported on 02/05/2017 09/03/16   Edsel PetrinMikhail, Maryann, DO    Family History Family History  Problem Relation Age of Onset  . COPD Father   . Hypertension Father   . Diabetes Mellitus II Father     Social History Social History   Tobacco Use  . Smoking status: Current Every Day Smoker    Packs/day: 1.00    Types: Cigarettes  . Smokeless tobacco: Never Used  Substance Use Topics  . Alcohol use: No    Comment: "alot"- In [facility]  . Drug use: Yes    Types: IV    Comment: pt states "all of  them"     Allergies   Fish allergy and Penicillins   Review of Systems Review of Systems  Constitutional: Negative for chills and fever.  Respiratory: Negative for cough, chest tightness and shortness of breath.   Cardiovascular: Positive for palpitations. Negative for chest pain and leg swelling.  Gastrointestinal: Negative for abdominal distention, abdominal pain, diarrhea, nausea and vomiting.  Genitourinary: Negative for dysuria, frequency, hematuria and urgency.  Musculoskeletal: Negative for arthralgias, myalgias, neck  pain and neck stiffness.  Skin: Negative for rash.  Allergic/Immunologic: Negative for immunocompromised state.  Neurological: Positive for tremors. Negative for dizziness, seizures, speech difficulty, weakness, light-headedness, numbness and headaches.  Psychiatric/Behavioral: The patient is nervous/anxious.   All other systems reviewed and are negative.    Physical Exam Updated Vital Signs There were no vitals taken for this visit.  Physical Exam  Constitutional: He appears well-developed and well-nourished. No distress.  HENT:  Head: Normocephalic and atraumatic.  Eyes: Conjunctivae are normal.  Neck: Neck supple.  Cardiovascular: Normal rate, regular rhythm and normal heart sounds.  Pulmonary/Chest: Effort normal. No respiratory distress. He has no wheezes. He has no rales.  Abdominal: Soft. Bowel sounds are normal. He exhibits no distension. There is no tenderness. There is no rebound.  Musculoskeletal: He exhibits no edema.  Neurological: He is alert.  Skin: Skin is warm and dry.  Psychiatric:  Anxious appearing. Unable to stay still  Nursing note and vitals reviewed.    ED Treatments / Results  Labs (all labs ordered are listed, but only abnormal results are displayed) Labs Reviewed - No data to display  EKG  EKG Interpretation None       Radiology No results found.  Procedures Procedures (including critical care time)  Medications Ordered in ED Medications  LORazepam (ATIVAN) injection 1 mg (not administered)     Initial Impression / Assessment and Plan / ED Course  I have reviewed the triage vital signs and the nursing notes.  Pertinent labs & imaging results that were available during my care of the patient were reviewed by me and considered in my medical decision making (see chart for details).     Pt with palpitations and anxiety after injecting heroin. Will try ativan, ecg.   Pt eloped without telling staff.   Vitals:   04/06/17 1907    BP: (!) 145/96  Pulse: 82  Resp: 20  SpO2: 97%    There were no vitals filed for this visit.   Final Clinical Impressions(s) / ED Diagnoses   Final diagnoses:  Anxiety  Severe heroin dependence [facility](HCC)    ED Discharge Orders    None       Jaynie CrumbleKirichenko, Sahvannah Rieser, PA-C 04/07/17 0044    Tilden Fossaees, Elizabeth, MD 04/07/17 1949

## 2017-04-06 NOTE — ED Triage Notes (Signed)
To ED for eval of possible overdose. Pt states he injected heroin approx 30 min pta - states it is 'making me fell crazy'. States he was told it was heroin but has never felt like this before. Visitor with pt states she just feels 'fucked up' but doesn't want to check in. Pt was ambulatory into triage room. resp e/u. Difficult for pt to sit still.

## 2017-04-06 NOTE — ED Notes (Signed)
Pt reports he has been a heroin user for approx 32 years however today when he attempted to use heroin, he "felt different". Pt states "that wasn't heroin, I don't know what it was but I feel anxious and just not right." Pt denies any hallucinations. VSS. Pt A&Ox4. Pt reports he tries to use clean needles when he uses drugs but recently he has been reusing the same needles when he uses IV drugs. Family at the bedside

## 2017-04-06 NOTE — ED Notes (Signed)
Pt eloped. Pt and pt family unable to be found in the emergency department. Secretary reports couple asked for the way out and walked out of the ED. Pt ambulatory with steady gait.

## 2017-09-02 ENCOUNTER — Encounter (HOSPITAL_COMMUNITY): Payer: Self-pay | Admitting: Emergency Medicine

## 2017-09-02 ENCOUNTER — Emergency Department (HOSPITAL_COMMUNITY)
Admission: EM | Admit: 2017-09-02 | Discharge: 2017-09-03 | Disposition: A | Discharge status: Home / Self Care | Payer: Self-pay | Attending: Emergency Medicine | Admitting: Emergency Medicine

## 2017-09-02 ENCOUNTER — Other Ambulatory Visit: Payer: Self-pay

## 2017-09-02 DIAGNOSIS — F1193 Opioid use, unspecified with withdrawal: Secondary | ICD-10-CM

## 2017-09-02 DIAGNOSIS — F1123 Opioid dependence with withdrawal: Secondary | ICD-10-CM | POA: Insufficient documentation

## 2017-09-02 DIAGNOSIS — Z79899 Other long term (current) drug therapy: Secondary | ICD-10-CM | POA: Insufficient documentation

## 2017-09-02 DIAGNOSIS — I10 Essential (primary) hypertension: Secondary | ICD-10-CM | POA: Insufficient documentation

## 2017-09-02 DIAGNOSIS — F17210 Nicotine dependence, cigarettes, uncomplicated: Secondary | ICD-10-CM | POA: Insufficient documentation

## 2017-09-02 LAB — CBC
HCT: 45.6 % (ref 39.0–52.0)
Hemoglobin: 15.5 g/dL (ref 13.0–17.0)
MCH: 29.5 pg (ref 26.0–34.0)
MCHC: 34 g/dL (ref 30.0–36.0)
MCV: 86.7 fL (ref 78.0–100.0)
Platelets: 301 K/uL (ref 150–400)
RBC: 5.26 MIL/uL (ref 4.22–5.81)
RDW: 12.2 % (ref 11.5–15.5)
WBC: 12.2 K/uL — ABNORMAL HIGH (ref 4.0–10.5)

## 2017-09-02 LAB — COMPREHENSIVE METABOLIC PANEL
ALBUMIN: 4 g/dL (ref 3.5–5.0)
ALT: 65 U/L — ABNORMAL HIGH (ref 17–63)
AST: 35 U/L (ref 15–41)
Alkaline Phosphatase: 69 U/L (ref 38–126)
Anion gap: 10 (ref 5–15)
BILIRUBIN TOTAL: 0.8 mg/dL (ref 0.3–1.2)
BUN: 9 mg/dL (ref 6–20)
CHLORIDE: 103 mmol/L (ref 101–111)
CO2: 26 mmol/L (ref 22–32)
Calcium: 9.1 mg/dL (ref 8.9–10.3)
Creatinine, Ser: 1.34 mg/dL — ABNORMAL HIGH (ref 0.61–1.24)
GFR calc Af Amer: >60 mL/min (ref >60)
GFR calc non Af Amer: >60 mL/min (ref >60)
GLUCOSE: 82 mg/dL (ref 65–99)
POTASSIUM: 3.9 mmol/L (ref 3.5–5.1)
Sodium: 139 mmol/L (ref 135–145)
Total Protein: 7.3 g/dL (ref 6.5–8.1)

## 2017-09-02 LAB — RAPID URINE DRUG SCREEN, HOSP PERFORMED
Amphetamines: NOT DETECTED
Barbiturates: NOT DETECTED
Benzodiazepines: NOT DETECTED
Cocaine: NOT DETECTED
Opiates: POSITIVE — AB
Tetrahydrocannabinol: POSITIVE — AB

## 2017-09-02 LAB — ETHANOL: Alcohol, Ethyl (B): <10 mg/dL (ref <10)

## 2017-09-02 NOTE — ED Triage Notes (Addendum)
Pt reports he wants to "get clean from heroin and fentanyl"  Reports he has been using for a year, but prior to that he had been clean for three years after treatment.  Last used heroin and fentanyl 5 hours ago.  Pt has been trying to get in rehab for 3 days.

## 2017-09-03 NOTE — ED Notes (Signed)
Pt discharged from ED; instructions provided; Pt encouraged to return to ED if symptoms worsen and to f/u with PCP; Pt verbalized understanding of all instructions 

## 2017-09-03 NOTE — ED Provider Notes (Signed)
[facility] EMERGENCY DEPARTMENT Provider Note   CSN: [id number] Arrival date & time: 09/02/17  2014     History   Chief Complaint Chief Complaint  Patient presents with  . Drug Problem    HPI Todd [name] is a 42 y.o. male.  HPI  42 year old male comes in with chief complaint of drug problem and opiate withdrawals.  Patient has history of hypertension and hepatitis C. patient states that he relapsed about 3 months ago and he uses heroin.  Patient's last opiate use was 3 hours prior to ED arrival.  Patient utilized: Services in the past to get placed at a rehab facility and he is seeking the same at this time.  Patient does not have any medical complaints and he is not suicidal.    Past Medical History:  Diagnosis Date  . Hepatitis C   . Hypertension     Patient Active Problem List   Diagnosis Date Noted  . AKI (acute kidney injury) (HCC) 09/01/2016  . Rhabdomyolysis 09/01/2016  . Essential hypertension 09/01/2016  . Left flank pain   . Major depressive disorder, 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  . Polysubstance (including opioids) dependence with physiological dependence (HCC) 03/10/2016  . Substance induced mood disorder (HCC) 02/26/2013  . PTSD (post-traumatic stress disorder) 02/26/2013  . Polysubstance dependence including opioid type drug, episodic abuse (HCC) 02/24/2013  . Alcohol abuse 02/24/2013    Past Surgical History:  Procedure Laterality Date  . TESTICLE TORSION REDUCTION          Home Medications    Prior to Admission medications   Medication Sig Start Date End Date Taking? Authorizing Provider  lisinopril (PRINIVIL,ZESTRIL) 10 MG tablet Take 1 tablet (10 mg total) by mouth daily. 02/05/17   Arthor Captain, PA-C  meloxicam (MOBIC) 15 MG tablet Take 1 tablet (15 mg total) by mouth daily. 02/05/17   Harris, Abigail, PA-C  METHADONE HCL PO Take by mouth.    [provider]  propranolol  (INDERAL) 20 MG tablet Take 1 tablet (20 mg total) by mouth 2 (two) times daily. 02/05/17   Harris, Abigail, PA-C  QUEtiapine (SEROQUEL) 200 MG tablet Take 1 tablet (200 mg total) by mouth at bedtime. Patient not taking: Reported on 02/05/2017 09/03/16   Edsel Petrin, DO  QUEtiapine (SEROQUEL) 50 MG tablet     [provider]  Sertraline HCl (ZOLOFT PO) Take by mouth.    [provider]  thiamine 100 MG tablet Take 1 tablet (100 mg total) by mouth daily. Patient not taking: Reported on 02/05/2017 09/03/16   Edsel Petrin, DO    Family History Family History  Problem Relation Age of Onset  . COPD Father   . Hypertension Father   . Diabetes Mellitus II Father     Social History Social History   Tobacco Use  . Smoking status: Current Every Day Smoker    Packs/day: 1.00    Types: Cigarettes  . Smokeless tobacco: Never Used  Substance Use Topics  . Alcohol use: No    Comment: "alot"- In [facility]  . Drug use: Yes    Types: IV    Comment: pt states "all of them"     Allergies   Fish allergy and Penicillins   Review of Systems Review of Systems  Constitutional: Positive for activity change.  Psychiatric/Behavioral: Negative for suicidal ideas.     Physical Exam Updated Vital Signs BP (!) 147/103 (BP Location: Right Arm)   Pulse 78  Temp 98.2 F (36.8 C) (Oral)   Resp 18   Ht 6\' 1"  (1.854 m)   Wt 102.1 kg (225 lb)   SpO2 100%   BMI 29.69 kg/m   Physical Exam  Constitutional: He is oriented to person, place, and time. He appears well-developed.  Cardiovascular: Normal rate.  Pulmonary/Chest: Effort normal.  Neurological: He is alert and oriented to person, place, and time.  Skin: Skin is warm.  Nursing note and vitals reviewed.    ED Treatments / Results  Labs (all labs ordered are listed, but only abnormal results are displayed) Labs Reviewed  COMPREHENSIVE METABOLIC PANEL - Abnormal; Notable for the following components:      Result  Value   Creatinine, Ser 1.34 (*)    ALT 65 (*)    All other components within normal limits  CBC - Abnormal; Notable for the following components:   WBC 12.2 (*)    All other components within normal limits  RAPID URINE DRUG SCREEN, HOSP PERFORMED - Abnormal; Notable for the following components:   Opiates POSITIVE (*)    Tetrahydrocannabinol POSITIVE (*)    All other components within normal limits  ETHANOL    EKG None  Radiology No results found.  Procedures Procedures (including critical care time)  Medications Ordered in ED Medications - No data to display   Initial Impression / Assessment and Plan / ED Course  I have reviewed the triage vital signs and the nursing notes.  Pertinent labs & imaging results that were available during my care of the patient were reviewed by me and considered in my medical decision making (see chart for details).     [id number] year old with history of heroin abuse comes in with chief complaint of withdrawals.  Patient is also seeking rehab placement.  At this time patient is not having any medical complaints, and he is not having any emergent complications from opiate usage or withdrawals.  Patient informed about the resources we can provide in the outpatient setting  Final Clinical Impressions(s) / ED Diagnoses   Final diagnoses:  Opiate withdrawal [facility](HCC)    ED Discharge Orders    None       Derwood KaplanNanavati, Ashunti Schofield, MD 09/03/17 2306

## 2017-09-26 ENCOUNTER — Encounter (HOSPITAL_COMMUNITY): Payer: Self-pay | Admitting: Emergency Medicine

## 2017-09-26 ENCOUNTER — Emergency Department (HOSPITAL_COMMUNITY)
Admission: EM | Admit: 2017-09-26 | Discharge: 2017-09-27 | Disposition: A | Discharge status: Home / Self Care | Payer: No Typology Code available for payment source | Attending: Emergency Medicine | Admitting: Emergency Medicine

## 2017-09-26 ENCOUNTER — Emergency Department (HOSPITAL_COMMUNITY): Payer: No Typology Code available for payment source

## 2017-09-26 DIAGNOSIS — R51 Headache: Secondary | ICD-10-CM | POA: Insufficient documentation

## 2017-09-26 DIAGNOSIS — Z5321 Procedure and treatment not carried out due to patient leaving prior to being seen by health care provider: Secondary | ICD-10-CM | POA: Diagnosis not present

## 2017-09-26 MED ORDER — CYCLOBENZAPRINE HCL 10 MG PO TABS
10.0000 mg | ORAL_TABLET | Freq: Once | ORAL | Status: AC
  Administered 2017-09-26: 10 mg via ORAL
  Filled 2017-09-26: qty 1

## 2017-09-26 MED ORDER — IBUPROFEN 400 MG PO TABS
600.0000 mg | ORAL_TABLET | Freq: Once | ORAL | Status: AC
  Administered 2017-09-26: 17:00:00 600 mg via ORAL
  Filled 2017-09-26: qty 1
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
## 2017-09-26 NOTE — ED Notes (Signed)
Pt walked out of room and left.
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
## 2018-04-14 ENCOUNTER — Emergency Department
Admission: EM | Admit: 2018-04-14 | Discharge: 2018-04-14 | Disposition: A | Discharge status: Home / Self Care | Payer: Self-pay | Attending: Emergency Medicine | Admitting: Emergency Medicine

## 2018-04-14 ENCOUNTER — Other Ambulatory Visit: Payer: Self-pay

## 2018-04-14 ENCOUNTER — Encounter: Payer: Self-pay | Admitting: Emergency Medicine

## 2018-04-14 DIAGNOSIS — F1920 Other psychoactive substance dependence, uncomplicated: Secondary | ICD-10-CM | POA: Insufficient documentation

## 2018-04-14 DIAGNOSIS — F17210 Nicotine dependence, cigarettes, uncomplicated: Secondary | ICD-10-CM | POA: Insufficient documentation

## 2018-04-14 DIAGNOSIS — I10 Essential (primary) hypertension: Secondary | ICD-10-CM | POA: Insufficient documentation

## 2018-04-14 DIAGNOSIS — Z760 Encounter for issue of repeat prescription: Secondary | ICD-10-CM | POA: Insufficient documentation

## 2018-04-14 DIAGNOSIS — Z79899 Other long term (current) drug therapy: Secondary | ICD-10-CM | POA: Insufficient documentation

## 2018-04-14 MED ORDER — PROPRANOLOL HCL 20 MG PO TABS: 20.0000 mg | ORAL_TABLET | Freq: Two times a day (BID) | ORAL | 3 refills | Status: DC

## 2018-04-14 MED ORDER — CLONIDINE HCL 0.1 MG PO TABS
0.1000 mg | ORAL_TABLET | Freq: Once | ORAL | Status: AC
  Administered 2018-04-14: 0.1 mg via ORAL
  Filled 2018-04-14: qty 1

## 2018-04-14 MED ORDER — LISINOPRIL 10 MG PO TABS
10.0000 mg | ORAL_TABLET | Freq: Once | ORAL | Status: AC
  Administered 2018-04-14: 10 mg via ORAL
  Filled 2018-04-14: qty 1

## 2018-04-14 MED ORDER — LISINOPRIL 10 MG PO TABS: 10.0000 mg | ORAL_TABLET | Freq: Every day | ORAL | 3 refills | Status: DC

## 2018-04-14 MED ORDER — QUETIAPINE FUMARATE 200 MG PO TABS: 200.0000 mg | ORAL_TABLET | Freq: Every day | ORAL | 0 refills | Status: DC

## 2018-04-14 NOTE — ED Triage Notes (Signed)
Pt arrives POV to triage with c/o [facility] needing a prescription for him for placement. Per pt, [facility] has accepted pt but requires him to have prescriptions in order to go to their facility. Pt is in NAD.

## 2018-04-14 NOTE — ED Notes (Signed)
Pt. Going to [facility] via [facility] rep picking up patient in lobby.

## 2018-04-14 NOTE — Discharge Instructions (Signed)
Please follow up with primary care within 1 week and return to the ED for any concerns.

## 2018-04-14 NOTE — ED Notes (Signed)
[facility], [facility] willing to pick [facility] patient, but are required by policy to only admit if Blood pressure is within parameters.

## 2018-04-14 NOTE — ED Notes (Signed)
Pt. Requesting a script of anti-hypertension medication, so he can have clearance to go to [facility].

## 2018-04-14 NOTE — ED Provider Notes (Signed)
[facility] Emergency Department Provider Note  ____________________________________________   First MD Initiated Contact with Patient 04/14/18 0205     (approximate)  I have reviewed the triage vital signs and the nursing notes.   HISTORY  Chief Complaint Medical Clearance    HPI Todd HanlyRobert Dayton is a 42 y.o. male who comes to the emergency department requesting medical clearance prior to inpatient alcohol detox.  He has a bed for him at [facility] this morning and says he only needs prescriptions to get him through the next month.   He needs prescriptions for lisinopril, propranolol, and Seroquel.  His last drink was several days ago and he denies any tremors diaphoresis or signs of withdrawal.  He has no medical complaints whatsoever.   Past Medical History:  Diagnosis Date  . Hepatitis C   . Hypertension     Patient Active Problem List   Diagnosis Date Noted  . AKI (acute kidney injury) (HCC) 09/01/2016  . Rhabdomyolysis 09/01/2016  . Essential hypertension 09/01/2016  . Left flank pain   . Major depressive disorder, 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  . Polysubstance (including opioids) dependence with physiological dependence (HCC) 03/10/2016  . Substance induced mood disorder (HCC) 02/26/2013  . PTSD (post-traumatic stress disorder) 02/26/2013  . Polysubstance dependence including opioid type drug, episodic abuse (HCC) 02/24/2013  . Alcohol abuse 02/24/2013    Past Surgical History:  Procedure Laterality Date  . TESTICLE TORSION REDUCTION      Prior to Admission medications   Medication Sig Start Date End Date Taking? Authorizing Provider  lisinopril (PRINIVIL,ZESTRIL) 10 MG tablet Take 1 tablet (10 mg total) by mouth daily. 04/14/18   Merrily Brittleifenbark, Jeniffer Culliver, MD  meloxicam (MOBIC) 15 MG tablet Take 1 tablet (15 mg total) by mouth daily. 02/05/17   Harris, Abigail, PA-C  METHADONE HCL PO Take by mouth.    [provider]  propranolol (INDERAL) 20 MG tablet Take 1 tablet (20 mg total) by mouth 2 (two) times daily. 04/14/18   Merrily Brittleifenbark, Gordon Carlson, MD  QUEtiapine (SEROQUEL) 200 MG tablet Take 1 tablet (200 mg total) by mouth at bedtime. 04/14/18   Merrily Brittleifenbark, Maliq Pilley, MD  Sertraline HCl (ZOLOFT PO) Take by mouth.    [provider]  thiamine 100 MG tablet Take 1 tablet (100 mg total) by mouth daily. Patient not taking: Reported on 02/05/2017 09/03/16   Edsel PetrinMikhail, Maryann, DO    Allergies Fish allergy and Penicillins  Family History  Problem Relation Age of Onset  . COPD Father   . Hypertension Father   . Diabetes Mellitus II Father     Social History Social History   Tobacco Use  . Smoking status: Current Every Day Smoker    Packs/day: 1.00    Types: Cigarettes  . Smokeless tobacco: Never Used  Substance Use Topics  . Alcohol use: No    Comment: "alot"- In [place]  . Drug use: Yes    Types: IV    Comment: pt states "all of them"    Review of Systems Constitutional: No fever/chills Cardiovascular: Denies chest pain. Respiratory: Denies shortness of breath. Gastrointestinal: No abdominal pain.  No nausea, no vomiting.     ____________________________________________   PHYSICAL EXAM:  VITAL SIGNS: ED Triage Vitals [04/14/18 0151]  Enc Vitals Group     BP (!) 177/108     Pulse Rate (!) 102     Resp 18     Temp 99.5 F (37.5 C)  Temp Source Oral     SpO2 98 %     Weight 250 lb (113.4 kg)     Height 6\' 1"  (1.854 m)     Head Circumference      Peak Flow      Pain Score 0     Pain Loc      Pain Edu?      Excl. in GC?     Constitutional: Alert and oriented x4 somewhat strange affect but nontoxic no diaphoresis Head: Atraumatic.  Pupils midrange and brisk Mouth/Throat: No trismus no tongue fasciculations Cardiovascular: Regular rate and rhythm Respiratory: Normal respiratory effort.  No retractions. MSK: No hand tremors Neurologic:   No gross focal neurologic  deficits are appreciated.  Skin:  Skin is warm, dry and intact. No rash noted.    ____________________________________________  LABS (all labs ordered are listed, but only abnormal results are displayed)  Labs Reviewed - No data to display   __________________________________________  EKG   ____________________________________________  RADIOLOGY   ____________________________________________   DIFFERENTIAL includes but not limited to  Alcohol withdrawal, hypertension, schizophrenia   PROCEDURES  Procedure(s) performed: no  Procedures  Critical Care performed: no  ____________________________________________   INITIAL IMPRESSION / ASSESSMENT AND PLAN / ED COURSE  Pertinent labs & imaging results that were available during my care of the patient were reviewed by me and considered in my medical decision making (see chart for details).   As part of my medical decision making, I reviewed the following data within the electronic MEDICAL RECORD NUMBER History obtained from family if available, nursing notes, old chart and ekg, as well as notes from prior ED visits.  The patient has no acute medical issues no complaints.  We will refill his medications.  [facility] will not take him with a blood pressure over 170s and given a single dose of clonidine with improvement in his symptoms.  No evidence of withdrawal.  We will help him get primary care follow-up as well.      ____________________________________________   FINAL CLINICAL IMPRESSION(S) / ED DIAGNOSES  Final diagnoses:  Medication refill      NEW MEDICATIONS STARTED DURING THIS VISIT:  Discharge Medication List as of 04/14/2018  2:07 AM       Note:  This document was prepared using Dragon voice recognition software and may include unintentional dictation errors.      Merrily Brittle, MD 04/18/18 [id number]

## 2018-04-25 ENCOUNTER — Emergency Department (HOSPITAL_COMMUNITY)
Admission: EM | Admit: 2018-04-25 | Discharge: 2018-04-26 | Disposition: A | Discharge status: Other Acute Inpatient Hospital | Payer: Self-pay | Attending: Emergency Medicine | Admitting: Emergency Medicine

## 2018-04-25 ENCOUNTER — Inpatient Hospital Stay (HOSPITAL_COMMUNITY)
Admit: 2018-04-25 | Discharge: 2018-04-25 | Disposition: A | Discharge status: Home / Self Care | Payer: Federal, State, Local not specified - Other

## 2018-04-25 ENCOUNTER — Other Ambulatory Visit: Payer: Self-pay

## 2018-04-25 ENCOUNTER — Encounter (HOSPITAL_COMMUNITY): Payer: Self-pay | Admitting: Emergency Medicine

## 2018-04-25 DIAGNOSIS — R45851 Suicidal ideations: Secondary | ICD-10-CM | POA: Insufficient documentation

## 2018-04-25 DIAGNOSIS — I10 Essential (primary) hypertension: Secondary | ICD-10-CM | POA: Insufficient documentation

## 2018-04-25 DIAGNOSIS — Z008 Encounter for other general examination: Secondary | ICD-10-CM

## 2018-04-25 DIAGNOSIS — F17210 Nicotine dependence, cigarettes, uncomplicated: Secondary | ICD-10-CM | POA: Insufficient documentation

## 2018-04-25 DIAGNOSIS — Z79899 Other long term (current) drug therapy: Secondary | ICD-10-CM | POA: Insufficient documentation

## 2018-04-25 DIAGNOSIS — Z022 Encounter for examination for admission to residential institution: Secondary | ICD-10-CM | POA: Insufficient documentation

## 2018-04-25 LAB — CBC WITH DIFFERENTIAL/PLATELET
Abs Immature Granulocytes: 0.02 10*3/uL (ref 0.00–0.07)
BASOS ABS: 0.1 10*3/uL (ref 0.0–0.1)
BASOS PCT: 1 %
EOS PCT: 5 %
Eosinophils Absolute: 0.6 10*3/uL — ABNORMAL HIGH (ref 0.0–0.5)
HCT: 48.1 % (ref 39.0–52.0)
Hemoglobin: 15.3 g/dL (ref 13.0–17.0)
IMMATURE GRANULOCYTES: 0 %
LYMPHS ABS: 3.5 10*3/uL (ref 0.7–4.0)
LYMPHS PCT: 32 %
MCH: 27 pg (ref 26.0–34.0)
MCHC: 31.8 g/dL (ref 30.0–36.0)
MCV: 85 fL (ref 80.0–100.0)
Monocytes Absolute: 0.9 10*3/uL (ref 0.1–1.0)
Monocytes Relative: 8 %
NEUTROS ABS: 5.8 10*3/uL (ref 1.7–7.7)
NEUTROS PCT: 54 %
Platelets: 341 10*3/uL (ref 150–400)
RBC: 5.66 MIL/uL (ref 4.22–5.81)
RDW: 13 % (ref 11.5–15.5)
WBC: 10.9 10*3/uL — AB (ref 4.0–10.5)
nRBC: 0 % (ref 0.0–0.2)

## 2018-04-25 LAB — RAPID URINE DRUG SCREEN, HOSP PERFORMED
AMPHETAMINES: POSITIVE — AB
BARBITURATES: NOT DETECTED
Benzodiazepines: NOT DETECTED
Cocaine: POSITIVE — AB
Opiates: POSITIVE — AB
Tetrahydrocannabinol: POSITIVE — AB

## 2018-04-25 MED ORDER — LISINOPRIL 10 MG PO TABS
10.0000 mg | ORAL_TABLET | Freq: Every day | ORAL | Status: DC
  Administered 2018-04-26: 10 mg via ORAL
  Filled 2018-04-25: qty 1

## 2018-04-25 MED ORDER — PROPRANOLOL HCL 20 MG PO TABS
20.0000 mg | ORAL_TABLET | Freq: Two times a day (BID) | ORAL | Status: DC
  Administered 2018-04-25: 20 mg via ORAL
  Filled 2018-04-25: qty 1

## 2018-04-25 NOTE — ED Notes (Signed)
Bed: [facility] Expected date:  Expected time:  Means of arrival:  Comments: 
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
## 2018-04-25 NOTE — ED Provider Notes (Signed)
[facility]-EMERGENCY DEPT Provider Note   CSN: [id number] Arrival date & time: 04/25/18  2140     History   Chief Complaint Chief Complaint  Patient presents with  . Medical Clearance  . Suicide Attempt    HPI Todd [name] is a 42 y.o. male with a history of rhabdomyolysis, HTN, depression, and hepatitis C who presents to the emergency department with a chief complaint of clearance.  The patient states that he has been feeling suicidal for the last 2 weeks since his significant other broke up with him after an 8-year relationship.  He has been living in motels since the break-up.  He endorses constant, worsening low mood.  Was seen at [facility] earlier today and was accepted for inpatient admission pending medical clearance.  He states that earlier today he attempted to end his life by overdosing on heroin.  He states that his friend administered Narcan.  Last use was 1700.  He denies recent alcohol use or other IV or recreational drug use.  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  He denies homicidal ideation, or auditory or visual hallucinations.  He states that he takes propanolol and lisinopril for his blood pressure, but has not taken the medication in the last 2 days.  He denies of fever, chills, chest pain, dyspnea, abdominal pain, nausea, vomiting, diarrhea, headache, rash, or dizziness.  The history is provided by the patient. No language interpreter was used.    Past Medical History:  Diagnosis Date  . Hepatitis C   . Hypertension     Patient Active Problem List   Diagnosis Date Noted  . AKI (acute kidney injury) (HCC) 09/01/2016  . Rhabdomyolysis 09/01/2016  . Essential hypertension 09/01/2016  . Left flank pain   . Major depressive disorder, 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  . Polysubstance (including opioids) dependence with physiological dependence (HCC) 03/10/2016  . Substance induced mood  disorder (HCC) 02/26/2013  . PTSD (post-traumatic stress disorder) 02/26/2013  . Polysubstance dependence including opioid type drug, episodic abuse (HCC) 02/24/2013  . Alcohol abuse 02/24/2013    Past Surgical History:  Procedure Laterality Date  . TESTICLE TORSION REDUCTION          Home Medications    Prior to Admission medications   Medication Sig Start Date End Date Taking? Authorizing Provider  lisinopril (PRINIVIL,ZESTRIL) 10 MG tablet Take 1 tablet (10 mg total) by mouth daily. 04/14/18  Yes Merrily Brittle, MD  propranolol (INDERAL) 20 MG tablet Take 1 tablet (20 mg total) by mouth 2 (two) times daily. 04/14/18  Yes Merrily Brittle, MD  QUEtiapine (SEROQUEL) 200 MG tablet Take 1 tablet (200 mg total) by mouth at bedtime. 04/14/18  Yes Merrily Brittle, MD  Sertraline HCl (ZOLOFT PO) Take 25 mg by mouth daily.    Yes [provider]  meloxicam (MOBIC) 15 MG tablet Take 1 tablet (15 mg total) by mouth daily. Patient not taking: Reported on 04/25/2018 02/05/17   Arthor Captain, PA-C  thiamine 100 MG tablet Take 1 tablet (100 mg total) by mouth daily. Patient not taking: Reported on 02/05/2017 09/03/16   Edsel Petrin, DO    Family History Family History  Problem Relation Age of Onset  . COPD Father   . Hypertension Father   . Diabetes Mellitus II Father     Social History Social History   Tobacco Use  . Smoking status: Current Every Day Smoker    Packs/day:  1.00    Types: Cigarettes  . Smokeless tobacco: Never Used  Substance Use Topics  . Alcohol use: No    Comment: "alot"- In [facility]  . Drug use: Yes    Types: IV    Comment: pt states "all of them"     Allergies   Fish allergy and Penicillins   Review of Systems Review of Systems  Constitutional: Negative for appetite change and fever.  Eyes: Negative for visual disturbance.  Respiratory: Negative for shortness of breath.   Cardiovascular: Negative for chest pain and palpitations.    Gastrointestinal: Negative for abdominal pain, constipation, diarrhea, nausea and vomiting.  Genitourinary: Negative for dysuria.  Musculoskeletal: Negative for back pain.  Skin: Negative for rash.  Allergic/Immunologic: Negative for immunocompromised state.  Neurological: Negative for headaches.  Psychiatric/Behavioral: Positive for suicidal ideas. Negative for agitation, confusion, hallucinations and self-injury.     Physical Exam Updated Vital Signs BP (!) 160/100 (BP Location: Left Arm)   Pulse 89   Temp 99.2 F (37.3 C) (Oral)   Resp 19   Ht 6\' 1"  (1.854 m)   Wt 113.4 kg   SpO2 96%   BMI 32.98 kg/m   Physical Exam  Constitutional: He appears well-developed.  HENT:  Head: Normocephalic.  Eyes: Conjunctivae are normal.  Neck: Neck supple.  Cardiovascular: Normal rate, regular rhythm, normal heart sounds and intact distal pulses. Exam reveals no gallop and no friction rub.  No murmur heard. Pulmonary/Chest: Effort normal and breath sounds normal. No stridor. No respiratory distress. He has no wheezes. He has no rales. He exhibits no tenderness.  Abdominal: Soft. Bowel sounds are normal. He exhibits no distension and no mass. There is no tenderness. There is no rebound and no guarding. No hernia.  Neurological: He is alert.  Skin: Skin is warm and dry. Capillary refill takes less than 2 seconds.  Psychiatric: His affect is blunt. He is withdrawn. He is not actively hallucinating. He exhibits a depressed mood. He expresses suicidal ideation. He expresses no homicidal ideation. He expresses suicidal plans.  Nursing note and vitals reviewed.    ED Treatments / Results  Labs (all labs ordered are listed, but only abnormal results are displayed) Labs Reviewed  RAPID URINE DRUG SCREEN, HOSP PERFORMED - Abnormal; Notable for the following components:      Result Value   Opiates POSITIVE (*)    Cocaine POSITIVE (*)    Amphetamines POSITIVE (*)    Tetrahydrocannabinol  POSITIVE (*)    All other components within normal limits  CBC WITH DIFFERENTIAL/PLATELET - Abnormal; Notable for the following components:   WBC 10.9 (*)    Eosinophils Absolute 0.6 (*)    All other components within normal limits  ACETAMINOPHEN LEVEL - Abnormal; Notable for the following components:   Acetaminophen (Tylenol), Serum <10 (*)    All other components within normal limits  COMPREHENSIVE METABOLIC PANEL  ETHANOL  SALICYLATE LEVEL    EKG None  Radiology No results found.  Procedures Procedures (including critical care time)  Medications Ordered in ED Medications  propranolol (INDERAL) tablet 20 mg (20 mg Oral Given 04/25/18 2336)  lisinopril (PRINIVIL,ZESTRIL) tablet 10 mg (10 mg Oral Given 04/26/18 0006)     Initial Impression / Assessment and Plan / ED Course  I have reviewed the triage vital signs and the nursing notes.  Pertinent labs & imaging results that were available during my care of the patient were reviewed by me and considered in my medical decision making (see  chart for details).     42 year old male with a history of rhabdomyolysis, HTN, depression, and hepatitis C who presents to the emergency department with a chief complaint of clearance.  He has been accepted for inpatient treatment pending medical clearance.  Physical exam is unremarkable.  He is hypertensive at 160/100.  He has been given his home dose of propranolol and lisinopril.  Labs are notable for UA positive for opioids, cocaine, amphetamines, and THC.  Labs are otherwise reassuring.  The patient is medically cleared at this time. Psych RN [facility] per psychiatric team note earlier today. Pt medically cleared at this time. Psych hold orders and home med orders placed. Please see psych team notes for further documentation of care/dispo. Pt stable at time of med clearance.     Final Clinical Impressions(s) / ED Diagnoses   Final diagnoses:  Medical clearance for  psychiatric admission    ED Discharge Orders    None       Barkley BoardsMcDonald, Cher Egnor A, PA-C 04/26/18 0051    Molpus, Jonny RuizJohn, MD 04/26/18 [phone]

## 2018-04-25 NOTE — ED Triage Notes (Signed)
Patient reports SI with attempt to "OD on heroin today." Denies HI/A/V/H. Hx depression.

## 2018-04-25 NOTE — ED Notes (Signed)
Bed: WLPT4 Expected date:  Expected time:  Means of arrival:  Comments: 

## 2018-04-26 ENCOUNTER — Inpatient Hospital Stay (HOSPITAL_COMMUNITY)
Admission: AD | Admit: 2018-04-26 | Discharge: 2018-04-30 | DRG: 885 | Disposition: A | Discharge status: Home / Self Care | Payer: Federal, State, Local not specified - Other | Source: Intra-hospital | Attending: Psychiatry | Admitting: Psychiatry

## 2018-04-26 ENCOUNTER — Encounter (HOSPITAL_COMMUNITY): Payer: Self-pay | Admitting: *Deleted

## 2018-04-26 ENCOUNTER — Other Ambulatory Visit: Payer: Self-pay

## 2018-04-26 DIAGNOSIS — F332 Major depressive disorder, recurrent severe without psychotic features: Secondary | ICD-10-CM | POA: Diagnosis present

## 2018-04-26 DIAGNOSIS — Z833 Family history of diabetes mellitus: Secondary | ICD-10-CM | POA: Diagnosis not present

## 2018-04-26 DIAGNOSIS — F1210 Cannabis abuse, uncomplicated: Secondary | ICD-10-CM | POA: Diagnosis present

## 2018-04-26 DIAGNOSIS — F1410 Cocaine abuse, uncomplicated: Secondary | ICD-10-CM | POA: Diagnosis present

## 2018-04-26 DIAGNOSIS — F1123 Opioid dependence with withdrawal: Secondary | ICD-10-CM | POA: Diagnosis present

## 2018-04-26 DIAGNOSIS — Z915 Personal history of self-harm: Secondary | ICD-10-CM

## 2018-04-26 DIAGNOSIS — E119 Type 2 diabetes mellitus without complications: Secondary | ICD-10-CM | POA: Diagnosis present

## 2018-04-26 DIAGNOSIS — Z590 Homelessness: Secondary | ICD-10-CM

## 2018-04-26 DIAGNOSIS — F4310 Post-traumatic stress disorder, unspecified: Secondary | ICD-10-CM | POA: Diagnosis present

## 2018-04-26 DIAGNOSIS — F419 Anxiety disorder, unspecified: Secondary | ICD-10-CM | POA: Diagnosis not present

## 2018-04-26 DIAGNOSIS — F17210 Nicotine dependence, cigarettes, uncomplicated: Secondary | ICD-10-CM | POA: Diagnosis present

## 2018-04-26 DIAGNOSIS — I10 Essential (primary) hypertension: Secondary | ICD-10-CM | POA: Diagnosis present

## 2018-04-26 DIAGNOSIS — F1920 Other psychoactive substance dependence, uncomplicated: Secondary | ICD-10-CM | POA: Diagnosis not present

## 2018-04-26 DIAGNOSIS — F1510 Other stimulant abuse, uncomplicated: Secondary | ICD-10-CM | POA: Diagnosis present

## 2018-04-26 DIAGNOSIS — G4700 Insomnia, unspecified: Secondary | ICD-10-CM | POA: Diagnosis not present

## 2018-04-26 DIAGNOSIS — R45851 Suicidal ideations: Secondary | ICD-10-CM | POA: Diagnosis present

## 2018-04-26 HISTORY — DX: Major depressive disorder, single episode, unspecified: F32.9

## 2018-04-26 HISTORY — DX: Depression, unspecified: F32.A

## 2018-04-26 LAB — COMPREHENSIVE METABOLIC PANEL
ALK PHOS: 60 U/L (ref 38–126)
ALT: 40 U/L (ref 0–44)
AST: 28 U/L (ref 15–41)
Albumin: 4.3 g/dL (ref 3.5–5.0)
Anion gap: 8 (ref 5–15)
BUN: 13 mg/dL (ref 6–20)
CALCIUM: 8.9 mg/dL (ref 8.9–10.3)
CO2: 26 mmol/L (ref 22–32)
CREATININE: 0.95 mg/dL (ref 0.61–1.24)
Chloride: 104 mmol/L (ref 98–111)
GFR calc Af Amer: >60 mL/min (ref >60)
Glucose, Bld: 92 mg/dL (ref 70–99)
POTASSIUM: 4.1 mmol/L (ref 3.5–5.1)
SODIUM: 138 mmol/L (ref 135–145)
Total Bilirubin: 0.5 mg/dL (ref 0.3–1.2)
Total Protein: 8 g/dL (ref 6.5–8.1)

## 2018-04-26 LAB — SALICYLATE LEVEL

## 2018-04-26 LAB — ETHANOL: Alcohol, Ethyl (B): <10 mg/dL (ref <10)

## 2018-04-26 LAB — ACETAMINOPHEN LEVEL

## 2018-04-26 MED ORDER — CLONIDINE HCL 0.1 MG PO TABS
0.1000 mg | ORAL_TABLET | Freq: Four times a day (QID) | ORAL | Status: AC
  Administered 2018-04-26 – 2018-04-27 (×8): 0.1 mg via ORAL
  Filled 2018-04-26 (×8): qty 1

## 2018-04-26 MED ORDER — ALUM & MAG HYDROXIDE-SIMETH 200-200-20 MG/5ML PO SUSP: 30.0000 mL | ORAL | Status: DC | PRN

## 2018-04-26 MED ORDER — LOPERAMIDE HCL 2 MG PO CAPS
2.0000 mg | ORAL_CAPSULE | ORAL | Status: DC | PRN
  Administered 2018-04-27: 2 mg via ORAL
  Administered 2018-04-28: 4 mg via ORAL
  Filled 2018-04-26 (×2): qty 1

## 2018-04-26 MED ORDER — INFLUENZA VAC SPLIT QUAD 0.5 ML IM SUSY
0.5000 mL | PREFILLED_SYRINGE | INTRAMUSCULAR | Status: DC
  Filled 2018-04-26: qty 0.5

## 2018-04-26 MED ORDER — HYDROXYZINE HCL 25 MG PO TABS
25.0000 mg | ORAL_TABLET | Freq: Four times a day (QID) | ORAL | Status: DC | PRN
  Administered 2018-04-26 – 2018-04-29 (×6): 25 mg via ORAL
  Filled 2018-04-26 (×6): qty 1

## 2018-04-26 MED ORDER — METHOCARBAMOL 500 MG PO TABS
1000.0000 mg | ORAL_TABLET | Freq: Three times a day (TID) | ORAL | Status: DC
  Administered 2018-04-26 – 2018-04-27 (×2): 1000 mg via ORAL
  Filled 2018-04-26 (×5): qty 2

## 2018-04-26 MED ORDER — TRAZODONE HCL 50 MG PO TABS
50.0000 mg | ORAL_TABLET | Freq: Every evening | ORAL | Status: DC | PRN
  Filled 2018-04-26 (×4): qty 1

## 2018-04-26 MED ORDER — SERTRALINE HCL 25 MG PO TABS
25.0000 mg | ORAL_TABLET | Freq: Every day | ORAL | Status: DC
  Administered 2018-04-26 – 2018-04-28 (×3): 25 mg via ORAL
  Filled 2018-04-26 (×6): qty 1

## 2018-04-26 MED ORDER — ONDANSETRON 4 MG PO TBDP
4.0000 mg | ORAL_TABLET | Freq: Four times a day (QID) | ORAL | Status: DC | PRN
  Administered 2018-04-26 – 2018-04-27 (×3): 4 mg via ORAL
  Filled 2018-04-26 (×3): qty 1

## 2018-04-26 MED ORDER — METHOCARBAMOL 500 MG PO TABS
500.0000 mg | ORAL_TABLET | Freq: Three times a day (TID) | ORAL | Status: DC | PRN
  Administered 2018-04-26: 500 mg via ORAL
  Filled 2018-04-26: qty 1

## 2018-04-26 MED ORDER — CLONIDINE HCL 0.1 MG PO TABS
0.1000 mg | ORAL_TABLET | Freq: Every day | ORAL | Status: DC
  Administered 2018-04-30: 0.1 mg via ORAL
  Filled 2018-04-26 (×2): qty 1

## 2018-04-26 MED ORDER — NICOTINE 21 MG/24HR TD PT24
21.0000 mg | MEDICATED_PATCH | Freq: Every day | TRANSDERMAL | Status: DC
  Filled 2018-04-26 (×4): qty 1

## 2018-04-26 MED ORDER — NAPROXEN 500 MG PO TABS
500.0000 mg | ORAL_TABLET | Freq: Two times a day (BID) | ORAL | Status: DC | PRN
  Administered 2018-04-26 – 2018-04-29 (×6): 500 mg via ORAL
  Filled 2018-04-26 (×6): qty 1

## 2018-04-26 MED ORDER — QUETIAPINE FUMARATE 200 MG PO TABS
200.0000 mg | ORAL_TABLET | Freq: Every day | ORAL | Status: DC
  Administered 2018-04-26 – 2018-04-27 (×2): 200 mg via ORAL
  Filled 2018-04-26 (×4): qty 1

## 2018-04-26 MED ORDER — QUETIAPINE FUMARATE 50 MG PO TABS
50.0000 mg | ORAL_TABLET | Freq: Once | ORAL | Status: AC
  Administered 2018-04-26: 50 mg via ORAL
  Filled 2018-04-26 (×2): qty 1

## 2018-04-26 MED ORDER — PROPRANOLOL HCL 20 MG PO TABS
20.0000 mg | ORAL_TABLET | Freq: Every day | ORAL | Status: DC
  Administered 2018-04-27 – 2018-04-30 (×4): 20 mg via ORAL
  Filled 2018-04-26: qty 2
  Filled 2018-04-26 (×5): qty 1

## 2018-04-26 MED ORDER — DICYCLOMINE HCL 20 MG PO TABS
20.0000 mg | ORAL_TABLET | Freq: Four times a day (QID) | ORAL | Status: DC | PRN
  Administered 2018-04-26 – 2018-04-28 (×4): 20 mg via ORAL
  Filled 2018-04-26 (×4): qty 1

## 2018-04-26 MED ORDER — MAGNESIUM HYDROXIDE 400 MG/5ML PO SUSP: 30.0000 mL | Freq: Every day | ORAL | Status: DC | PRN

## 2018-04-26 MED ORDER — LISINOPRIL 10 MG PO TABS
10.0000 mg | ORAL_TABLET | Freq: Every day | ORAL | Status: DC
  Administered 2018-04-27 – 2018-04-30 (×4): 10 mg via ORAL
  Filled 2018-04-26: qty 2
  Filled 2018-04-26 (×5): qty 1

## 2018-04-26 MED ORDER — ACETAMINOPHEN 325 MG PO TABS
650.0000 mg | ORAL_TABLET | Freq: Four times a day (QID) | ORAL | Status: DC | PRN
  Administered 2018-04-26: 650 mg via ORAL
  Filled 2018-04-26: qty 2

## 2018-04-26 MED ORDER — CLONIDINE HCL 0.1 MG PO TABS
0.1000 mg | ORAL_TABLET | ORAL | Status: AC
  Administered 2018-04-28 – 2018-04-29 (×4): 0.1 mg via ORAL
  Filled 2018-04-26 (×4): qty 1

## 2018-04-26 MED ORDER — FLUOXETINE HCL 20 MG PO CAPS
20.0000 mg | ORAL_CAPSULE | Freq: Every day | ORAL | Status: DC
  Filled 2018-04-26 (×3): qty 1

## 2018-04-26 NOTE — ED Notes (Signed)
Received from triage after he verbalized SI with an earlier attempt today to OD on heroin. Recent break up with girlfriend of 8 years and he is devastated. He wasn't answering a friends phone calls so friend went to where he was staying and [facility]. He would like rehab.He is currently feeling "hopeless" and tired. Cooperative. One previous SI 20 yrs ago tried to hang self.

## 2018-04-26 NOTE — Progress Notes (Signed)
Vol admit to the 300 hall.  Presented as a walk in and was sent for med clearance.  Pt was found by a friend at a motel where he had OD on heroin.  The friend had been trying to call and he would not answer, so friend went to check on him and found him unconscious.  His friend used Narcan to counteract the heroin and [facility].  Pt was positive for heroin, cocaine, THC, and methamphetamines.  Pt says he was using 2 gm of IV heroin daily.  Pt says his GF of 8 yrs left him,  and he has felt hopeless since then.  He said he had stayed sober for over a year before relapsing.  Pt denies any major medical issues.  He said he was an occasional alcohol user.  Pt was irrit[facility] when [facility] after the med clearance, but was cooperative with the admission process.  Paperwork was signed, and search was completed.  Pt was reoriented to the unit.  Safety checks q15 minutes initiated.

## 2018-04-26 NOTE — ED Notes (Signed)
Todd StallionMarcus [name] called to inform writer he had been accepted per Dr Jama Flavorsobos to room 304-2 and [name]

## 2018-04-26 NOTE — BH Assessment (Signed)
BHH Assessment Progress Note   Clinician was informed by Rodena MedinAC Kim Brooks that [facility]2 to Dr. Jama Flavorsobos.  Clinician informed nurse Eino FarberSue Beck of pt acceptance.  Requested patient come over by 04:00 if possible and that voluntary [facility] to arrival.  Fax to [phone].

## 2018-04-26 NOTE — Progress Notes (Signed)
Adult Psychoeducational Group Note  Date:  04/26/2018 Time:  9:01 PM  Group Topic/Focus:  Wrap-Up Group:   The focus of this group is to help patients review their daily goal of treatment and discuss progress on daily workbooks.  Participation Level:  Minimal  Participation Quality:  Drowsy  Affect:  Flat  Cognitive:  Alert and Oriented  Insight: Appropriate  Engagement in Group:  Developing/Improving  Modes of Intervention:  Exploration and Support  Additional Comments:   Pt verbalized that he had an okay day. Pt verbalized that something positive was that he got his meds. Pt verbalized that he wants to work on getting his medicine.   Nicholette Dolson, Randal Bubaerri Lee 04/26/2018, 9:01 PM

## 2018-04-26 NOTE — Progress Notes (Signed)
Pt refused to take Prozac as ordered stating that it makes him angry. Pt stated that he cannot take Trazodone at bedtime because it causes him to have night terrors.   Pt became agitated and started yelling about not being started on any of his home medications. Writer called Dr. Jeannine KittenFarah and made him aware of the pt's complaints. Per Dr. Jeannine KittenFarah, writer may order Seroquel 50 mg once for agitation,  Seroquel 200 mg at bedtime for sleep/mood, Inderal 20 mg daily, Lisinopril 10 mg daily and Zoloft 25 mg daily. Trazodone and Prozac discontinued per Dr. Jeannine KittenFarah. Orders placed and discussed with pt. Pt verbalized understanding.
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
## 2018-04-26 NOTE — BHH Suicide Risk Assessment (Signed)
B[facility] INPATIENT:  Family/Significant Other Suicide Prevention Education  Suicide Prevention Education:  Patient Refusal for Family/Significant Other Suicide Prevention Education: The patient Todd [name] has refused to provide written consent for family/significant other to be provided Family/Significant Other Suicide Prevention Education during admission and/or prior to discharge.  Physician notified.  SPE completed with pt, as pt refused to consent to family contact. SPI pamphlet provided to pt and pt was encouraged to share information with support network, ask questions, and talk about any concerns relating to SPE. Pt denies access to guns/firearms and verbalized understanding of information provided. Mobile Crisis information also provided to pt.   Rona RavensHeather S Jaylene Schrom LCSW 04/26/2018, 10:26 AM
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
## 2018-04-26 NOTE — ED Notes (Signed)
Patient states he does want to hurt himself that is why he was trying to overdose on heroine. Patient states he is really depressed.
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
## 2018-04-26 NOTE — Tx Team (Signed)
Initial Treatment Plan 04/26/2018 6:10 AM Todd Hanlyobert Salberg [id number]    PATIENT STRESSORS: Financial difficulties Marital or family conflict Medication change or noncompliance Substance abuse   PATIENT STRENGTHS: Average or above average intelligence Capable of independent living General fund of knowledge   PATIENT IDENTIFIED PROBLEMS: Depression  Risk for self harm  Break up with GF of 8 yrs  Substance abuse    "Stop feeling suicidal"  "Get my life back on track"         DISCHARGE CRITERIA:  Ability to meet basic life and health needs Adequate post-discharge living arrangements Improved stabilization in mood, thinking, and/or behavior Need for constant or close observation no longer present Verbal commitment to aftercare and medication compliance Withdrawal symptoms are absent or subacute and managed without 24-hour nursing intervention  PRELIMINARY DISCHARGE PLAN: Attend aftercare/continuing care group Outpatient therapy Placement in alternative living arrangements  PATIENT/FAMILY INVOLVEMENT: This treatment plan has been presented to and reviewed with the patient, Todd HanlyRobert [name], and/or family member.  The patient and family have been given the opportunity to ask questions and make suggestions.  Todd [name], Todd Heggie Church, RN 04/26/2018, 6:10 AM

## 2018-04-26 NOTE — Progress Notes (Signed)
Pt presents with a flat affect and an anxious/irritable mood. Pt reports increased depression and anxiety today. Pt endorses passive SI with no plan or intent. Pt reports fair sleep. Pt verbally contracts for safety. Pt c/o withdrawal symptoms of sweats, tremors, crawling sensations, generalized body aches and spasms. Pt administered meds per protocol for discomfort. Pt denies AVH.   Medications administered as ordered per MD. Verbal support provided. Pt encouraged to attend groups. 15 minute checks performed for safety. COWs assessed.   Pt compliant with tx plan.  No side effects to meds verbalized by pt.

## 2018-04-26 NOTE — H&P (Signed)
Behavioral Health Medical Screening Exam  Todd [name] is an 42 y.o. male.presents with friend, endorsing polysubstance abuse with related depression and SI.   Total Time spent with patient: 20 minutes  Psychiatric Specialty Exam: Physical Exam  Constitutional: He appears well-developed and well-nourished. No distress.  HENT:  Head: Normocephalic.  Eyes: Pupils are equal, round, and reactive to light.  Respiratory: Effort normal and breath sounds normal.  Skin: Skin is warm and dry. He is not diaphoretic.  Psychiatric: His mood appears anxious. He is agitated. Cognition and memory are impaired. He expresses impulsivity. He exhibits a depressed mood. He expresses suicidal ideation. He expresses suicidal plans.    Review of Systems  Constitutional: Negative for chills, diaphoresis, fever, malaise/fatigue and weight loss.  Psychiatric/Behavioral: Positive for depression, substance abuse and suicidal ideas. The patient has insomnia.   All other systems reviewed and are negative.   There were no vitals taken for this visit.There is no height or weight on file to calculate BMI.  General Appearance: Disheveled  Eye Contact:  Fair  Speech:  Clear and Coherent  Volume:  Normal  Mood:  Depressed  Affect:  Congruent  Thought Process:  Goal Directed  Orientation:  Full (Time, Place, and Person)  Thought Content:  Logical  Suicidal Thoughts:  Yes.  with intent/plan  Homicidal Thoughts:  No  Memory:  Immediate;   Poor  Judgement:  Impaired  Insight:  Lacking  Psychomotor Activity:  Restlessness  Concentration: Concentration: Poor  Recall:  Fair  Fund of Knowledge:Fair  Language: Fair  Akathisia:  Negative  Handed:  Right  AIMS (if indicated):     Assets:  Desire for Improvement  Sleep:       Musculoskeletal: Strength & Muscle Tone: within normal limits Gait & Station: normal Patient leans: N/A  There were no vitals taken for this visit.  Recommendations:  Based on my  evaluation the patient appears to have an emergency medical condition for which I recommend the patient be transferred to the emergency department for further evaluation.  Todd HoughSpencer E Emrys Mceachron, PA-C 04/26/2018, 5:00 AM

## 2018-04-27 MED ORDER — METHOCARBAMOL 750 MG PO TABS
1500.0000 mg | ORAL_TABLET | Freq: Three times a day (TID) | ORAL | Status: DC
  Administered 2018-04-27 – 2018-04-28 (×4): 1500 mg via ORAL
  Filled 2018-04-27 (×11): qty 2

## 2018-04-27 MED ORDER — NICOTINE POLACRILEX 2 MG MT GUM
2.0000 mg | CHEWING_GUM | OROMUCOSAL | Status: DC | PRN
  Administered 2018-04-28: 2 mg via ORAL

## 2018-04-27 MED ORDER — GABAPENTIN 400 MG PO CAPS
400.0000 mg | ORAL_CAPSULE | Freq: Three times a day (TID) | ORAL | Status: DC
  Administered 2018-04-27 – 2018-04-30 (×10): 400 mg via ORAL
  Filled 2018-04-27 (×14): qty 1

## 2018-04-27 MED ORDER — GABAPENTIN 800 MG PO TABS
400.0000 mg | ORAL_TABLET | Freq: Three times a day (TID) | ORAL | Status: DC
  Filled 2018-04-27 (×4): qty 0.5

## 2018-04-27 NOTE — Progress Notes (Signed)
Recreation Therapy Notes  Date: 12.13.19 Time: 0930 Location: 300 [address] Dayroom  Group Topic: Stress Management  Goal Area(s) Addresses:  Patient will verbalize importance of using healthy stress management.  Patient will identify positive emotions associated with healthy stress management.   Behavioral Response: Engaged  Intervention: Stress Management  Activity :  Meditation.  LRT introduced the stress management technique of meditation.  LRT played a meditation focused on taking on the characteristics of a mountain.  Patients were to listen as meditation played to engage in the activity.  Education:  Stress Management, Discharge Planning.   Education Outcome: Acknowledges edcuation/In group clarification offered/Needs additional education  Clinical Observations/Feedback: Pt attended and participated in activity.      Caroll RancherMarjette Arlo Buffone, LRT/CTRS         Lillia AbedLindsay, Leven Hoel A 04/27/2018 11:26 AM

## 2018-04-27 NOTE — Progress Notes (Signed)
Patient ID: Todd HanlyRobert [name], male   DOB: [birth year], 42 y.o.   MRN: [id number]  D. Pt presents with an angry affect and restless, anxious behavior. Presents with signs and symptoms of withdrawal including headache, fatigue, anxiety, nausea/vomiting and stomach cramping. Pt in bed this morning, reports being not feeling well and requests to get medications later in the day. Pt answers writers questions with short, one word answers. Pt shirt has an inappropriate gesture on it and when approached by a tech about this, pt becomes agitated and verbally aggressive towards staff member.   A. Labs and vitals monitored. Pt given and educated on scheduled and as needed medications. Pt supported emotionally and encouraged to express concerns and ask questions. Pt encouraged to drinks fluids and rest today.    R. Pt remains safe with 15 minute checks. Pt is redirectable. Pt reports passive SI, states "I'm not going to tell you if I am suicidal." Then upon further discussion, pt states "I just don't want to be here." Pt currently denies HI and AVH and agrees to contact staff before acting on any harmful thoughts.  Will continue POC.

## 2018-04-27 NOTE — Progress Notes (Signed)
[facility] MD Progress Note  04/27/2018 8:20 AM Todd [name]  MRN:  [id number] Subjective:  Patient did not agree with med changes we discussed earlier yesterday want to go back on his home meds which is fine he still very irritable poorly cooperative reporting multiple withdrawal symptoms coming off opiates and alcohol Drug screen showed opiates, cocaine, amphetamines and cannabis so he is having poly-substance and poly-system withdrawal. Again main issue is withdrawal irritability but denies wanting to harm self at this point.  States he would like some type of recovery program but will not commit to one now Reporting cramping sweats cravings Principal Problem: <principal problem not specified> Diagnosis: Active Problems:   MDD (major depressive disorder), recurrent episode, severe (HCC)  Total Time spent with patient: 20 minutes   Past Medical History:  Past Medical History:  Diagnosis Date  . Depression   . Hepatitis C   . Hypertension     Past Surgical History:  Procedure Laterality Date  . TESTICLE TORSION REDUCTION     Family History:  Family History  Problem Relation Age of Onset  . COPD Father   . Hypertension Father   . Diabetes Mellitus II Father     Social History:  Social History   Substance and Sexual Activity  Alcohol Use No   Comment: Occasional wine -bottle in 1-2 days     Social History   Substance and Sexual Activity  Drug Use Yes  . Types: IV, Heroin, Cocaine, Marijuana, Methamphetamines   Comment: pt states "all of them"    Social History   Socioeconomic History  . Marital status: Single    Spouse name: Not on file  . Number of children: Not on file  . Years of education: Not on file  . Highest education level: Not on file  Occupational History  . Not on file  Social Needs  . Financial resource strain: Not on file  . Food insecurity:    Worry: Not on file    Inability: Not on file  . Transportation needs:    Medical: Not on file   Non-medical: Not on file  Tobacco Use  . Smoking status: Current Every Day Smoker    Packs/day: 1.00    Types: Cigarettes  . Smokeless tobacco: Never Used  . Tobacco comment: Pt declines teaching  Substance and Sexual Activity  . Alcohol use: No    Comment: Occasional wine -bottle in 1-2 days  . Drug use: Yes    Types: IV, Heroin, Cocaine, Marijuana, Methamphetamines    Comment: pt states "all of them"  . Sexual activity: Not Currently  Lifestyle  . Physical activity:    Days per week: Not on file    Minutes per session: Not on file  . Stress: Not on file  Relationships  . Social connections:    Talks on phone: Not on file    Gets together: Not on file    Attends religious service: Not on file    Active member of club or organization: Not on file    Attends meetings of clubs or organizations: Not on file    Relationship status: Not on file  Other Topics Concern  . Not on file  Social History Narrative  . Not on file   Additional Social History:    Pain Medications: See home med list (using herion for pain relief) Prescriptions: See home med list Over the Counter: See home med list History of alcohol / drug use?: Yes Longest period of sobriety (  when/how long): about 2 years Negative Consequences of Use: Financial, Personal relationships Name of Substance 1: Heroin 1 - Age of First Use: 30's 1 - Amount (size/oz): 2 grams IV 1 - Frequency: Daily 1 - Duration: Last two weeks. 1 - Last Use / Amount: 12/11 around 17:00 Name of Substance 2: Marijuana 2 - Age of First Use: Teens 2 - Amount (size/oz): Varies 2 - Frequency: Daily 2 - Duration: Last two weeks 2 - Last Use / Amount: 12/11 Name of Substance 3: Cocaine 3 - Age of First Use: Can't recall 3 - Amount (size/oz): Varies 3 - Frequency: Every few days  3 - Duration: Last two weeks 3 - Last Use / Amount: Few days ago.              Sleep: Fair  Appetite:  Fair  Current Medications: Current  Facility-Administered Medications  Medication Dose Route Frequency Provider Last Rate Last Dose  . acetaminophen (TYLENOL) tablet 650 mg  650 mg Oral Q6H PRN Kerry Hough, PA-C   650 mg at 04/26/18 1610  . alum & mag hydroxide-simeth (MAALOX/MYLANTA) 200-200-20 MG/5ML suspension 30 mL  30 mL Oral Q4H PRN Kerry Hough, PA-C      . cloNIDine (CATAPRES) tablet 0.1 mg  0.1 mg Oral QID Donell Sievert E, PA-C   0.1 mg at 04/26/18 2154   Followed by  . [START ON 04/28/2018] cloNIDine (CATAPRES) tablet 0.1 mg  0.1 mg Oral BH-qamhs Simon, Spencer E, PA-C       Followed by  . [START ON 04/30/2018] cloNIDine (CATAPRES) tablet 0.1 mg  0.1 mg Oral QAC breakfast Donell Sievert E, PA-C      . dicyclomine (BENTYL) tablet 20 mg  20 mg Oral Q6H PRN Donell Sievert E, PA-C   20 mg at 04/26/18 0819  . gabapentin (NEURONTIN) tablet 400 mg  400 mg Oral TID Malvin Johns, MD      . hydrOXYzine (ATARAX/VISTARIL) tablet 25 mg  25 mg Oral Q6H PRN Donell Sievert E, PA-C   25 mg at 04/26/18 1629  . Influenza vac split quadrivalent PF (FLUARIX) injection 0.5 mL  0.5 mL Intramuscular Tomorrow-1000 Simon, Spencer E, PA-C      . lisinopril (PRINIVIL,ZESTRIL) tablet 10 mg  10 mg Oral Daily Malvin Johns, MD      . loperamide (IMODIUM) capsule 2-4 mg  2-4 mg Oral PRN Kerry Hough, PA-C      . magnesium hydroxide (MILK OF MAGNESIA) suspension 30 mL  30 mL Oral Daily PRN Kerry Hough, PA-C      . methocarbamol (ROBAXIN) tablet 1,500 mg  1,500 mg Oral TID Malvin Johns, MD      . naproxen (NAPROSYN) tablet 500 mg  500 mg Oral BID PRN Donell Sievert E, PA-C   500 mg at 04/26/18 1629  . nicotine (NICODERM CQ - dosed in mg/24 hours) patch 21 mg  21 mg Transdermal Daily Simon, Spencer E, PA-C      . ondansetron (ZOFRAN-ODT) disintegrating tablet 4 mg  4 mg Oral Q6H PRN Donell Sievert E, PA-C   4 mg at 04/26/18 1629  . propranolol (INDERAL) tablet 20 mg  20 mg Oral Daily Malvin Johns, MD      . QUEtiapine (SEROQUEL) tablet  200 mg  200 mg Oral QHS Malvin Johns, MD   200 mg at 04/26/18 2153  . sertraline (ZOLOFT) tablet 25 mg  25 mg Oral Daily Malvin Johns, MD   25 mg at 04/26/18 1729  Lab Results:  Results for orders placed or performed during the hospital encounter of 04/25/18 (from the past 48 hour(s))  Comprehensive metabolic panel     Status: None   Collection Time: 04/25/18 11:25 PM  Result Value Ref Range   Sodium 138 135 - 145 mmol/L   Potassium 4.1 3.5 - 5.1 mmol/L   Chloride 104 98 - 111 mmol/L   CO2 26 22 - 32 mmol/L   Glucose, Bld 92 70 - 99 mg/dL   BUN 13 6 - 20 mg/dL   Creatinine, Ser 6.570.95 0.61 - 1.24 mg/dL   Calcium 8.9 8.9 - 84.610.3 mg/dL   Total Protein 8.0 6.5 - 8.1 g/dL   Albumin 4.3 3.5 - 5.0 g/dL   AST 28 15 - 41 U/L   ALT 40 0 - 44 U/L   Alkaline Phosphatase 60 38 - 126 U/L   Total Bilirubin 0.5 0.3 - 1.2 mg/dL   GFR calc non Af Amer >60 >60 mL/min   GFR calc Af Amer >60 >60 mL/min   Anion gap 8 5 - 15    Comment: Performed at [facility], [address] [place], [place] [address]  Ethanol     Status: None   Collection Time: 04/25/18 11:25 PM  Result Value Ref Range   Alcohol, Ethyl (B) <10 <10 mg/dL    Comment: (NOTE) Lowest detectable limit for serum alcohol is 10 mg/dL. For medical purposes only. Performed at [facility], [address] [place], [place] [address]   CBC with Diff     Status: Abnormal   Collection Time: 04/25/18 11:25 PM  Result Value Ref Range   WBC 10.9 (H) 4.0 - 10.5 K/uL   RBC 5.66 4.22 - 5.81 MIL/uL   Hemoglobin 15.3 13.0 - 17.0 g/dL   HCT 24.448.1 01.039.0 - 27.252.0 %   MCV 85.0 80.0 - 100.0 fL   MCH 27.0 26.0 - 34.0 pg   MCHC 31.8 30.0 - 36.0 g/dL   RDW 53.613.0 64.411.5 - 03.415.5 %   Platelets 341 150 - 400 K/uL   nRBC 0.0 0.0 - 0.2 %   Neutrophils Relative % 54 %   Neutro Abs 5.8 1.7 - 7.7 K/uL   Lymphocytes Relative 32 %   Lymphs Abs 3.5 0.7 - 4.0 K/uL   Monocytes Relative 8 %   Monocytes Absolute 0.9 0.1 - 1.0  K/uL   Eosinophils Relative 5 %   Eosinophils Absolute 0.6 (H) 0.0 - 0.5 K/uL   Basophils Relative 1 %   Basophils Absolute 0.1 0.0 - 0.1 K/uL   Immature Granulocytes 0 %   Abs Immature Granulocytes 0.02 0.00 - 0.07 K/uL    Comment: Performed at [facility], [address] [place], [place] [address]  Acetaminophen level     Status: Abnormal   Collection Time: 04/25/18 11:25 PM  Result Value Ref Range   Acetaminophen (Tylenol), Serum <10 (L) 10 - 30 ug/mL    Comment: (NOTE) Therapeutic concentrations vary significantly. A range of 10-30 ug/mL  may be an effective concentration for many patients. However, some  are best treated at concentrations outside of this range. Acetaminophen concentrations >150 ug/mL at 4 hours after ingestion  and >50 ug/mL at 12 hours after ingestion are often associated with  toxic reactions. Performed at [facility], [address] [place], [place] [address]   Salicylate level     Status: None   Collection Time: 04/25/18 11:25 PM  Result Value Ref Range   Salicylate Lvl <7.0 2.8 - 30.0 mg/dL    Comment: Performed at [facility], [address] [place], [place] [address]  Urine rapid drug screen (hosp performed)     Status: Abnormal   Collection Time: 04/25/18 11:26 PM  Result Value Ref Range   Opiates POSITIVE (A) NONE DETECTED   Cocaine POSITIVE (A) NONE DETECTED   Benzodiazepines NONE DETECTED NONE DETECTED   Amphetamines POSITIVE (A) NONE DETECTED   Tetrahydrocannabinol POSITIVE (A) NONE DETECTED   Barbiturates NONE DETECTED NONE DETECTED    Comment: (NOTE) DRUG SCREEN FOR MEDICAL PURPOSES ONLY.  IF CONFIRMATION IS NEEDED FOR ANY PURPOSE, NOTIFY LAB WITHIN 5 DAYS. LOWEST DETECTABLE LIMITS FOR URINE DRUG SCREEN Drug Class                     Cutoff (ng/mL) Amphetamine and metabolites    1000 Barbiturate and metabolites    200 Benzodiazepine                 200 Tricyclics and  metabolites     300 Opiates and metabolites        300 Cocaine and metabolites        300 THC                            50 Performed at [facility], [address] [place], [place] [address]     Blood Alcohol level:  Lab Results  Component Value Date   ETH <10 04/25/2018   ETH <10 09/02/2017    Metabolic Disorder Labs: No results found for: HGBA1C, MPG No results found for: PROLACTIN No results found for: CHOL, TRIG, HDL, CHOLHDL, VLDL, LDLCALC  Physical Findings: AIMS: Facial and Oral Movements Muscles of Facial Expression: None, normal Lips and Perioral Area: None, normal Jaw: None, normal Tongue: None, normal,Extremity Movements Upper (arms, wrists, hands, fingers): None, normal Lower (legs, knees, ankles, toes): None, normal, Trunk Movements Neck, shoulders, hips: None, normal, Overall Severity Severity of abnormal movements (highest score from questions above): None, normal Incapacitation due to abnormal movements: None, normal Patient's awareness of abnormal movements (rate only patient's report): No Awareness, Dental Status Current problems with teeth and/or dentures?: No Does patient usually wear dentures?: No  CIWA:    COWS:  COWS Total Score: 9  Musculoskeletal: Strength & Muscle Tone: within normal limits Gait & Station: unsteady Patient leans: N/A  Psychiatric Specialty Exam: Physical Exam  ROS  Blood pressure 108/63, pulse 61, temperature 98.3 F (36.8 C), temperature source Oral, resp. rate 18, height 6\' 1"  (1.854 m), weight 108.9 kg.Body mass index is 31.66 kg/m.  General Appearance: Disheveled  Eye Contact:  None  Speech:  Slurred  Volume:  Decreased  Mood:  Angry and Irritable  Affect:  Congruent  Thought Process:  Linear  Orientation:  Full (Time, Place, and Person)  Thought Content:  Logical  Suicidal Thoughts:  No  Homicidal Thoughts:  No  Memory:  Immediate;   Fair  Judgement:  Fair  Insight:  Fair  Psychomotor  Activity:  Psychomotor Retardation  Concentration:  Concentration: Fair  Recall:  [employer] of Knowledge:  Fair  Language:  Fair  Akathisia:  Negative  Handed:  Right  AIMS (if indicated):     Assets:  Physical Health  ADL's:  Intact  Cognition:  WNL  Sleep:  Number of Hours: 6.75  For polysubstance withdrawal continue detox protocols and gabapentin for cramping and cravings add Robaxin at higher dose for cramping For irritability continue SRI Continue cognitive therapy for depressive symptoms  Treatment Plan Summary: Daily contact with patient to assess and evaluate symptoms and progress in treatment and Medication management  Malvin Johns, MD 04/27/2018, 8:20 AM

## 2018-04-27 NOTE — Progress Notes (Signed)
Pt has been asleep in a chair all evening in the dayroom.  MHT reported that pt awoke for a short time during the evening group and participated briefly.  When writer went into the dayroom to suggest pt take his evening meds and go to bed, he was irritable and would not open his eyes even though he responded to Clinical research associatewriter.  Around 2200, pt came to the med window to get his medications.  He was diaphoretic, with his shirt visibly wet around the neck.  He voiced no concerns and only took the scheduled meds of Seroquel and Clonidine.  Pt went to bed afterwards.  Support and encouragement offered.  Discharge plans are in process.  Safety maintained with q15 minute checks.

## 2018-04-27 NOTE — Tx Team (Signed)
Interdisciplinary Treatment and Diagnostic Plan Update  04/27/2018 Time of Session: 0830AM Todd HanlyRobert [name] MRN: [id number]  Principal Diagnosis: MDD, recurrent, severe  Secondary Diagnoses: Active Problems:   MDD (major depressive disorder), recurrent episode, severe (HCC)   Current Medications:  Current Facility-Administered Medications  Medication Dose Route Frequency Provider Last Rate Last Dose  . acetaminophen (TYLENOL) tablet 650 mg  650 mg Oral Q6H PRN Kerry HoughSimon, Spencer E, PA-C   650 mg at 04/26/18 [id number]  . alum & mag hydroxide-simeth (MAALOX/MYLANTA) 200-200-20 MG/5ML suspension 30 mL  30 mL Oral Q4H PRN Kerry HoughSimon, Spencer E, PA-C      . cloNIDine (CATAPRES) tablet 0.1 mg  0.1 mg Oral QID Donell SievertSimon, Spencer E, PA-C   0.1 mg at 04/27/18 0912   Followed by  . [START ON 04/28/2018] cloNIDine (CATAPRES) tablet 0.1 mg  0.1 mg Oral BH-qamhs Simon, Spencer E, PA-C       Followed by  . [START ON 04/30/2018] cloNIDine (CATAPRES) tablet 0.1 mg  0.1 mg Oral QAC breakfast Donell SievertSimon, Spencer E, PA-C      . dicyclomine (BENTYL) tablet 20 mg  20 mg Oral Q6H PRN Donell SievertSimon, Spencer E, PA-C   20 mg at 04/27/18 0916  . gabapentin (NEURONTIN) capsule 400 mg  400 mg Oral TID Cobos, Rockey SituFernando A, MD   400 mg at 04/27/18 0920  . hydrOXYzine (ATARAX/VISTARIL) tablet 25 mg  25 mg Oral Q6H PRN Kerry HoughSimon, Spencer E, PA-C   25 mg at 04/27/18 0916  . Influenza vac split quadrivalent PF (FLUARIX) injection 0.5 mL  0.5 mL Intramuscular Tomorrow-1000 Simon, Spencer E, PA-C      . lisinopril (PRINIVIL,ZESTRIL) tablet 10 mg  10 mg Oral Daily Malvin JohnsFarah, Brian, MD   10 mg at 04/27/18 0913  . loperamide (IMODIUM) capsule 2-4 mg  2-4 mg Oral PRN Donell SievertSimon, Spencer E, PA-C   2 mg at 04/27/18 0916  . magnesium hydroxide (MILK OF MAGNESIA) suspension 30 mL  30 mL Oral Daily PRN Kerry HoughSimon, Spencer E, PA-C      . methocarbamol (ROBAXIN) tablet 1,500 mg  1,500 mg Oral TID Malvin JohnsFarah, Brian, MD      . naproxen (NAPROSYN) tablet 500 mg  500 mg Oral BID PRN Donell SievertSimon,  Spencer E, PA-C   500 mg at 04/27/18 0920  . nicotine (NICODERM CQ - dosed in mg/24 hours) patch 21 mg  21 mg Transdermal Daily Simon, Spencer E, PA-C      . ondansetron (ZOFRAN-ODT) disintegrating tablet 4 mg  4 mg Oral Q6H PRN Donell SievertSimon, Spencer E, PA-C   4 mg at 04/26/18 1629  . propranolol (INDERAL) tablet 20 mg  20 mg Oral Daily Malvin JohnsFarah, Brian, MD   20 mg at 04/27/18 0913  . QUEtiapine (SEROQUEL) tablet 200 mg  200 mg Oral QHS Malvin JohnsFarah, Brian, MD   200 mg at 04/26/18 2153  . sertraline (ZOLOFT) tablet 25 mg  25 mg Oral Daily Malvin JohnsFarah, Brian, MD   25 mg at 04/27/18 0913   PTA Medications: Medications Prior to Admission  Medication Sig Dispense Refill Last Dose  . lisinopril (PRINIVIL,ZESTRIL) 10 MG tablet Take 1 tablet (10 mg total) by mouth daily. 30 tablet 3 04/25/2018 at Unknown time  . propranolol (INDERAL) 20 MG tablet Take 1 tablet (20 mg total) by mouth 2 (two) times daily. 60 tablet 3 04/25/2018 at Unknown time  . QUEtiapine (SEROQUEL) 200 MG tablet Take 1 tablet (200 mg total) by mouth at bedtime. 30 tablet 0 Unknown at Unknown time  . Sertraline HCl (  ZOLOFT PO) Take 25 mg by mouth daily.    Unknown at Unknown time    Patient Stressors: Financial difficulties Marital or family conflict Medication change or noncompliance Substance abuse  Patient Strengths: Average or above average intelligence Capable of independent living General fund of knowledge  Treatment Modalities: Medication Management, Group therapy, Case management,  1 to 1 session with clinician, Psychoeducation, Recreational therapy.   Physician Treatment Plan for Primary Diagnosis: MDD, recurrent, severe Long Term Goal(s): Improvement in symptoms so as ready for discharge Improvement in symptoms so as ready for discharge   Short Term Goals: Ability to disclose and discuss suicidal ideas Ability to maintain clinical measurements within normal limits will improve Compliance with prescribed medications will  improve  Medication Management: Evaluate patient's response, side effects, and tolerance of medication regimen.  Therapeutic Interventions: 1 to 1 sessions, Unit Group sessions and Medication administration.  Evaluation of Outcomes: Progressing  Physician Treatment Plan for Secondary Diagnosis: Active Problems:   MDD (major depressive disorder), recurrent episode, severe (HCC)  Long Term Goal(s): Improvement in symptoms so as ready for discharge Improvement in symptoms so as ready for discharge   Short Term Goals: Ability to disclose and discuss suicidal ideas Ability to maintain clinical measurements within normal limits will improve Compliance with prescribed medications will improve     Medication Management: Evaluate patient's response, side effects, and tolerance of medication regimen.  Therapeutic Interventions: 1 to 1 sessions, Unit Group sessions and Medication administration.  Evaluation of Outcomes: Progressing   RN Treatment Plan for Primary Diagnosis:MDD, recurrent, severe Long Term Goal(s): Knowledge of disease and therapeutic regimen to maintain health will improve  Short Term Goals: Ability to remain free from injury will improve, Ability to verbalize frustration and anger appropriately will improve, Ability to disclose and discuss suicidal ideas and Ability to identify and develop effective coping behaviors will improve  Medication Management: RN will administer medications as ordered by provider, will assess and evaluate patient's response and provide education to patient for prescribed medication. RN will report any adverse and/or side effects to prescribing provider.  Therapeutic Interventions: 1 on 1 counseling sessions, Psychoeducation, Medication administration, Evaluate responses to treatment, Monitor vital signs and CBGs as ordered, Perform/monitor CIWA, COWS, AIMS and Fall Risk screenings as ordered, Perform wound care treatments as ordered.  Evaluation of  Outcomes: Progressing   LCSW Treatment Plan for Primary Diagnosis: MDD, recurrent, severe Long Term Goal(s): Safe transition to appropriate next level of care at discharge, Engage patient in therapeutic group addressing interpersonal concerns.  Short Term Goals: Engage patient in aftercare planning with referrals and resources, Facilitate patient progression through stages of change regarding substance use diagnoses and concerns and Identify triggers associated with mental health/substance abuse issues  Therapeutic Interventions: Assess for all discharge needs, 1 to 1 time with Social worker, Explore available resources and support systems, Assess for adequacy in community support network, Educate family and significant other(s) on suicide prevention, Complete Psychosocial Assessment, Interpersonal group therapy.  Evaluation of Outcomes: Progressing   Progress in Treatment: Attending groups: Yes. Participating in groups: Minimally  Taking medication as prescribed: Yes. Toleration medication: Yes. Family/Significant other contact made:  SPE completed with pt; pt declined to consent to collateral contact.  Patient understands diagnosis: Yes. Discussing patient identified problems/goals with staff: Yes. Medical problems stabilized or resolved: Yes. Denies suicidal/homicidal ideation: Yes. Issues/concerns per patient self-inventory: Yes. Other: pt reporting severe withdrawals/GI upset.   New problem(s) identified: No, Describe:  n/a  New Short Term/Long Term Goal(s):  detox, medication management for mood stabilization; elimination of SI thoughts; development of comprehensive mental wellness/sobriety plan.   Patient Goals:  "To get my life back on track and stop feeling like I want to kill myself."   Discharge Plan or Barriers: CSW assessing for appropriate referrals. Pt is exploring [facility] and outpatient follow-up and is open to learning more about residential options. [facility]  pamphlet, Mobile Crisis information, and AA/NA information provided to patient for additional community support and resources.   Reason for Continuation of Hospitalization: Anxiety Depression Medication stabilization Withdrawal symptoms  Estimated Length of Stay: Tuesday, 05/01/18  Attendees: Patient: Todd [name] 04/27/2018 10:44 AM  Physician: Dr. Jeannine Kitten MD 04/27/2018 10:44 AM  Nursing: Huntley Dec RN 04/27/2018 10:44 AM  RN Care Manager:x 04/27/2018 10:44 AM  Social Worker: Corrie Mckusick LCSW 04/27/2018 10:44 AM  Recreational Therapist: x 04/27/2018 10:44 AM  Other: Armandina Stammer NP 04/27/2018 10:44 AM  Other:  04/27/2018 10:44 AM  Other: 04/27/2018 10:44 AM    Scribe for Treatment Team: Rona Ravens, LCSW 04/27/2018 10:44 AM

## 2018-04-27 NOTE — [facility] Group Notes (Signed)
LCSW Group Therapy Note  04/27/2018 1:15pm  Type of Therapy and Topic:  Group Therapy:  Feelings around Relapse and Recovery  Participation Level:  Did Not Attend--pt invited. Chose to remain in bed.    Description of Group:    Patients in this group will discuss emotions they experience before and after a relapse. They will process how experiencing these feelings, or avoidance of experiencing them, relates to having a relapse. Facilitator will guide patients to explore emotions they have related to recovery. Patients will be encouraged to process which emotions are more powerful. They will be guided to discuss the emotional reaction significant others in their lives may have to their relapse or recovery. Patients will be assisted in exploring ways to respond to the emotions of others without this contributing to a relapse.  Therapeutic Goals: 1. Patient will identify two or more emotions that lead to a relapse for them 2. Patient will identify two emotions that result when they relapse 3. Patient will identify two emotions related to recovery 4. Patient will demonstrate ability to communicate their needs through discussion and/or role plays   Summary of Patient Progress:  x   Therapeutic Modalities:   Cognitive Behavioral Therapy Solution-Focused Therapy Assertiveness Training Relapse Prevention Therapy   Rona RavensHeather S Lynda Wanninger, LCSW 04/27/2018 12:23 PM

## 2018-04-28 DIAGNOSIS — G4700 Insomnia, unspecified: Secondary | ICD-10-CM

## 2018-04-28 MED ORDER — METHOCARBAMOL 750 MG PO TABS
750.0000 mg | ORAL_TABLET | Freq: Three times a day (TID) | ORAL | Status: DC
  Administered 2018-04-28 – 2018-04-30 (×5): 750 mg via ORAL
  Filled 2018-04-28 (×8): qty 1

## 2018-04-28 MED ORDER — SERTRALINE HCL 50 MG PO TABS: 50.0000 mg | ORAL_TABLET | Freq: Every day | ORAL | Status: DC

## 2018-04-28 MED ORDER — QUETIAPINE FUMARATE 300 MG PO TABS
300.0000 mg | ORAL_TABLET | Freq: Every day | ORAL | Status: DC
  Administered 2018-04-28 – 2018-04-29 (×2): 300 mg via ORAL
  Filled 2018-04-28 (×3): qty 1

## 2018-04-28 MED ORDER — SERTRALINE HCL 50 MG PO TABS
50.0000 mg | ORAL_TABLET | Freq: Every day | ORAL | Status: DC
  Administered 2018-04-29 – 2018-04-30 (×2): 50 mg via ORAL
  Filled 2018-04-28 (×3): qty 1

## 2018-04-28 MED ORDER — SERTRALINE HCL 25 MG PO TABS
25.0000 mg | ORAL_TABLET | Freq: Once | ORAL | Status: AC
  Administered 2018-04-28: 25 mg via ORAL
  Filled 2018-04-28 (×2): qty 1

## 2018-04-28 NOTE — Progress Notes (Signed)
Adult Psychoeducational Group Note  Date:  04/28/2018 Time:  8:30am-9:30am  Group Topic/Focus:  Goals Group:   The focus of this group is to help patients establish daily goals to achieve during treatment and discuss how the patient can incorporate goal setting into their daily lives to aide in recovery.  Participation Level:  Active  Participation Quality:  Appropriate and Attentive  Affect:  Appropriate  Cognitive:  Alert  Insight: Appropriate  Engagement in Group:  Engaged  Modes of Intervention:  Discussion and Support  Additional Comments:  Patient informed group that he did not want to participate in the icebreaker activity initially. As the icebreaker continued the patient began to participate and share. Patient informed group that his goal for the day was to stay positive and try not to obsess over his significant other and focus on reminding himself of what to accomplish. Patient informed the group that he was excited that he was feeling a little better today.   Annye Asamani Allizon Woznick 04/28/2018, 10:27 AM

## 2018-04-28 NOTE — [facility] Group Notes (Signed)
[facility] Group Notes:  (Nursing/MHT/Case Management/Adjunct)  Date:  04/28/2018  Time:  1:15 PM  Type of Therapy:  Nurse Education  Participation Level:  Active  Participation Quality:  Appropriate and Attentive  Affect:  Appropriate  Cognitive:  Alert and Appropriate  Insight:  Appropriate  Engagement in Group:  Engaged and Improving  Modes of Intervention:  Discussion and Education  Summary of Progress/Problems: pt's identified needs and learned how to develop healthier coping skills.   Suszanne ConnersMichael R Zyah Gomm 04/28/2018, 3:04 PM

## 2018-04-28 NOTE — Progress Notes (Signed)
Patient has been observed up in the dayroom watching tv with no interaction with peers. Writer informed him of medications scheduled which he took but also requested medications for his withdrawal symptoms which he received. Patient has had little interaction with with Clinical research associatewriter. Safety maintained on unit with 15 min checks.

## 2018-04-28 NOTE — Progress Notes (Signed)
D Pt is observed OOB UAL on the 300 hall today. HE is gruff, flat, depressed and says " I feel like s--- because I'm coming off of heroin and this feeling is getting old....give me everything you have .Marland Kitchen.11:06 AM!".      A Pt took am scheduled meds as planned He competed his daily assessment and on this he wrote he has experienced SI and when writer asks him to clarify he says in a loud surprised voice " oh wow..ibuprofen thought that meant yesterday.Marland Kitchen.Marland Kitchen.I'm fine today". He contracts verbally with this Clinical research associatewriter to stay safe and to not hurt self. HE rates his depression, hopelessness and anxiety " 9/9/9/ ", respectively. He is seen soicializing with his peers- watching TV, talking on the dayroom phone, drinking coffee. His behaviors are appropriate.     R Safety is in place.

## 2018-04-28 NOTE — Progress Notes (Signed)
[facility] MD Progress Note  04/28/2018 9:51 AM Todd [name]  MRN:  [id number]   Subjective: Patient reports today that he is hoping to be discharged home soon.  He is denying any homicidal ideations and denies any hallucinations.  He states that he still has thoughts of being better off dead than alive.  He denies any active thoughts of a plan or intent of suicide though.  He is still going to the clonidine detox and is encouraged to stay here throughout the remainder of his detox protocol and to make sure that his medications are adjusted to improve his depression.  Patient states that he wants to be discharged soon but understands and will stay through the remainder of his detox and hopes to discharge at the beginning of the week.  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  He reports that his sleep has been disrupted and he wakes up during the night and has difficulty going back to sleep.  Objective: Patient's chart and findings reviewed and discussed with treatment team.  Patient presents in the milieu and is interacting with peers and staff appropriately.  Patient is pleasant, calm, and cooperative.  Patient presents alert and oriented and has a logical conversation with me today.  Principal Problem: MDD (major depressive disorder), recurrent episode, severe (HCC) Diagnosis: Principal Problem:   MDD (major depressive disorder), recurrent episode, severe (HCC) Active Problems:   Polysubstance (including opioids) dependence with physiological dependence (HCC)  Total Time spent with patient: 30 minutes  Past Psychiatric History: See H&P  Past Medical History:  Past Medical History:  Diagnosis Date  . Depression   . Hepatitis C   . Hypertension     Past Surgical History:  Procedure Laterality Date  . TESTICLE TORSION  REDUCTION     Family History:  Family History  Problem Relation Age of Onset  . COPD Father   . Hypertension Father   . Diabetes Mellitus II Father    Family Psychiatric  History: See H&P Social History:  Social History   Substance and Sexual Activity  Alcohol Use No   Comment: Occasional wine -bottle in 1-2 days     Social History   Substance and Sexual Activity  Drug Use Yes  . Types: IV, Heroin, Cocaine, Marijuana, Methamphetamines   Comment: pt states "all of them"    Social History   Socioeconomic History  . Marital status: Single    Spouse name: Not on file  . Number of children: Not on file  . Years of education: Not on file  . Highest education level: Not on file  Occupational History  . Not on file  Social Needs  . Financial resource strain: Not on file  . Food insecurity:    Worry: Not on file    Inability: Not on file  . Transportation needs:    Medical: Not on file    Non-medical: Not on file  Tobacco Use  . Smoking status: Current Every Day Smoker    Packs/day: 1.00    Types: Cigarettes  . Smokeless tobacco: Never Used  . Tobacco comment: Pt declines teaching  Substance and Sexual Activity  . Alcohol use: No    Comment: Occasional wine -bottle in 1-2 days  . Drug use: Yes    Types:  IV, Heroin, Cocaine, Marijuana, Methamphetamines    Comment: pt states "all of them"  . Sexual activity: Not Currently  Lifestyle  . Physical activity:    Days per week: Not on file    Minutes per session: Not on file  . Stress: Not on file  Relationships  . Social connections:    Talks on phone: Not on file    Gets together: Not on file    Attends religious service: Not on file    Active member of club or organization: Not on file    Attends meetings of clubs or organizations: Not on file    Relationship status: Not on file  Other Topics Concern  . Not on file  Social History Narrative  . Not on file   Additional Social History:    Pain Medications:  See home med list (using herion for pain relief) Prescriptions: See home med list Over the Counter: See home med list History of alcohol / drug use?: Yes Longest period of sobriety (when/how long): about 2 years Negative Consequences of Use: Financial, Personal relationships Name of Substance 1: Heroin 1 - Age of First Use: 30's 1 - Amount (size/oz): 2 grams IV 1 - Frequency: Daily 1 - Duration: Last two weeks. 1 - Last Use / Amount: 12/11 around 17:00 Name of Substance 2: Marijuana 2 - Age of First Use: Teens 2 - Amount (size/oz): Varies 2 - Frequency: Daily 2 - Duration: Last two weeks 2 - Last Use / Amount: 12/11 Name of Substance 3: Cocaine 3 - Age of First Use: Can't recall 3 - Amount (size/oz): Varies 3 - Frequency: Every few days  3 - Duration: Last two weeks 3 - Last Use / Amount: Few days ago.              Sleep: Fair  Appetite:  Good  Current Medications: Current Facility-Administered Medications  Medication Dose Route Frequency Provider Last Rate Last Dose  . acetaminophen (TYLENOL) tablet 650 mg  650 mg Oral Q6H PRN Kerry Hough, PA-C   650 mg at 04/26/18 1610  . alum & mag hydroxide-simeth (MAALOX/MYLANTA) 200-200-20 MG/5ML suspension 30 mL  30 mL Oral Q4H PRN Donell Sievert E, PA-C      . cloNIDine (CATAPRES) tablet 0.1 mg  0.1 mg Oral BH-qamhs Simon, Spencer E, PA-C   0.1 mg at 04/28/18 0831   Followed by  . [START ON 04/30/2018] cloNIDine (CATAPRES) tablet 0.1 mg  0.1 mg Oral QAC breakfast Donell Sievert E, PA-C      . dicyclomine (BENTYL) tablet 20 mg  20 mg Oral Q6H PRN Donell Sievert E, PA-C   20 mg at 04/27/18 1716  . gabapentin (NEURONTIN) capsule 400 mg  400 mg Oral TID Cobos, Rockey Situ, MD   400 mg at 04/28/18 0831  . hydrOXYzine (ATARAX/VISTARIL) tablet 25 mg  25 mg Oral Q6H PRN Kerry Hough, PA-C   25 mg at 04/27/18 2118  . Influenza vac split quadrivalent PF (FLUARIX) injection 0.5 mL  0.5 mL Intramuscular Tomorrow-1000 Simon, Spencer  E, PA-C      . lisinopril (PRINIVIL,ZESTRIL) tablet 10 mg  10 mg Oral Daily Malvin Johns, MD   10 mg at 04/28/18 0831  . loperamide (IMODIUM) capsule 2-4 mg  2-4 mg Oral PRN Donell Sievert E, PA-C   2 mg at 04/27/18 0916  . magnesium hydroxide (MILK OF MAGNESIA) suspension 30 mL  30 mL Oral Daily PRN Kerry Hough, PA-C      .  methocarbamol (ROBAXIN) tablet 1,500 mg  1,500 mg Oral TID Malvin Johns, MD   1,500 mg at 04/28/18 0834  . naproxen (NAPROSYN) tablet 500 mg  500 mg Oral BID PRN Kerry Hough, PA-C   500 mg at 04/27/18 2118  . nicotine polacrilex (NICORETTE) gum 2 mg  2 mg Oral PRN Malvin Johns, MD   2 mg at 04/28/18 1191  . ondansetron (ZOFRAN-ODT) disintegrating tablet 4 mg  4 mg Oral Q6H PRN Kerry Hough, PA-C   4 mg at 04/27/18 2118  . propranolol (INDERAL) tablet 20 mg  20 mg Oral Daily Malvin Johns, MD   20 mg at 04/28/18 0831  . QUEtiapine (SEROQUEL) tablet 300 mg  300 mg Oral QHS [place] Dost, Gerlene Burdock, FNP      . sertraline (ZOLOFT) tablet 25 mg  25 mg Oral Once Traven Davids, Gerlene Burdock, FNP      . [START ON 04/29/2018] sertraline (ZOLOFT) tablet 50 mg  50 mg Oral Daily [place] Hotz, Gerlene Burdock, FNP        Lab Results: No results found for this or any previous visit (from the past 48 hour(s)).  Blood Alcohol level:  Lab Results  Component Value Date   ETH <10 04/25/2018   ETH <10 09/02/2017    Metabolic Disorder Labs: No results found for: HGBA1C, MPG No results found for: PROLACTIN No results found for: CHOL, TRIG, HDL, CHOLHDL, VLDL, LDLCALC  Physical Findings: AIMS: Facial and Oral Movements Muscles of Facial Expression: None, normal Lips and Perioral Area: None, normal Jaw: None, normal Tongue: None, normal,Extremity Movements Upper (arms, wrists, hands, fingers): None, normal Lower (legs, knees, ankles, toes): None, normal, Trunk Movements Neck, shoulders, hips: None, normal, Overall Severity Severity of abnormal movements (highest score from questions above): None,  normal Incapacitation due to abnormal movements: None, normal Patient's awareness of abnormal movements (rate only patient's report): No Awareness, Dental Status Current problems with teeth and/or dentures?: No Does patient usually wear dentures?: No  CIWA:    COWS:  COWS Total Score: 3  Musculoskeletal: Strength & Muscle Tone: within normal limits Gait & Station: normal Patient leans: N/A  Psychiatric Specialty Exam: Physical Exam  Nursing note and vitals reviewed. Constitutional: He is oriented to person, place, and time. He appears well-developed and well-nourished.  Cardiovascular: Normal rate.  Respiratory: Effort normal.  Musculoskeletal: Normal range of motion.  Neurological: He is alert and oriented to person, place, and time.  Skin: Skin is warm.    Review of Systems  Constitutional: Negative.   HENT: Negative.   Eyes: Negative.   Respiratory: Negative.   Cardiovascular: Negative.   Gastrointestinal: Negative.   Genitourinary: Negative.   Musculoskeletal: Negative.   Skin: Negative.   Neurological: Negative.   Endo/Heme/Allergies: Negative.   Psychiatric/Behavioral: Positive for depression, substance abuse and suicidal ideas. Negative for hallucinations. The patient has insomnia.     Blood pressure 127/89, pulse 88, temperature 98.8 F (37.1 C), temperature source Oral, resp. rate 18, height 6\' 1"  (1.854 m), weight 108.9 kg.Body mass index is 31.66 kg/m.  General Appearance: Casual  Eye Contact:  Good  Speech:  Clear and Coherent and Normal Rate  Volume:  Normal  Mood:  Depressed  Affect:  Congruent  Thought Process:  Coherent and Descriptions of Associations: Intact  Orientation:  Full (Time, Place, and Person)  Thought Content:  WDL  Suicidal Thoughts:  Still has thoughts of being better off dead  Homicidal Thoughts:  No  Memory:  Immediate;   Good  Recent;   Good Remote;   Good  Judgement:  Fair  Insight:  Good  Psychomotor Activity:  Normal   Concentration:  Concentration: Good and Attention Span: Good  Recall:  Good  Fund of Knowledge:  Good  Language:  Good  Akathisia:  No  Handed:  Right  AIMS (if indicated):     Assets:  Communication Skills Desire for Improvement Housing Physical Health Social Support Transportation  ADL's:  Intact  Cognition:  WNL  Sleep:  Number of Hours: 6.25   Problems addressed MDD severe recurrent Polysubstance dependence  Treatment Plan Summary: Daily contact with patient to assess and evaluate symptoms and progress in treatment, Medication management and Plan is to:  Continue clonidine detox protocol Continue Neurontin 400 mg p.o. 3 times daily for withdrawal symptoms and mood stability Continue Vistaril 25 mg p.o. every 6 hours as needed for anxiety Increase Seroquel 300 mg p.o. nightly for mood stability Increase Zoloft 50 mg p.o. daily for mood stability Encourage group therapy participation  Maryfrances Bunnellravis B Derrin Currey, FNP 04/28/2018, 9:51 AM

## 2018-04-28 NOTE — Progress Notes (Signed)
Adult Psychoeducational Group Note  Date:  04/28/2018 Time:  8:47 AM  Group Topic/Focus:  Conflict Resolution:   The focus of this group is to discuss the conflict resolution process and how it may be used upon discharge.  Participation Level:  Minimal  Participation Quality:  Drowsy  Affect:  Flat  Cognitive:  Appropriate  Insight: Appropriate  Engagement in Group:  Limited  Modes of Intervention:  Discussion  Additional Comments:  Pt attended and participated in morning group.  Todd [name] 04/28/2018, 8:47 AM

## 2018-04-29 NOTE — [facility] Group Notes (Signed)
[facility] Group Notes:  (Nursing)  Date:  04/29/2018  Time:1:00 pm Type of Therapy:  Nurse Education  Participation Level:  Active  Participation Quality:  Appropriate  Affect:  Appropriate  Cognitive:  Appropriate  Insight:  Appropriate  Engagement in Group:  Engaged  Modes of Intervention:  Education  Summary of Progress/Problems: Nurse led  'Life Skills' group  Todd [name] 04/29/2018, 2:50 PM

## 2018-04-29 NOTE — Progress Notes (Signed)
Patient did attend the evening speaker AA meeting but exited a few times complaining about the group and trying to interact with patients from the other hall. Pt returned to group but exited 10 minutes before it concluded. .Marland Kitchen

## 2018-04-29 NOTE — Progress Notes (Signed)
[facility]  04/29/2018 10:43 AM Todd HanlyRobert [name]  MRN:  [id number]   Subjective: Patient states that he is doing good today and denies any homicidal ideations or any hallucinations.  He denies any active suicidal thoughts, but then states that yesterday he did have a couple of thoughts of better off being dead.  He also reports that the thoughts of better off being dead have been present for a very long time, actually longer than he can remember when they started.  He said they come and go and nothing is ever really stopped him.  He reports that he would like to continue his medications and has plans to go [facility].  He also reports that he has an [facility] interview at 2 PM today.  Objective: Patient's chart and findings reviewed and discussed with treatment team.  Patient presents in the hallway and is pleasant, calm, and cooperative.  Patient has been seen interacting with peers and staff appropriately.  Patient has been attending groups and has been hanging out in the day room watching TV for majority of the day.  There have been no major complaints about this patient.  Principal Problem: MDD (major depressive disorder), recurrent episode, severe (HCC) Diagnosis: Principal Problem:   MDD (major depressive disorder), recurrent episode, severe (HCC) Active Problems:   Polysubstance (including opioids) dependence with physiological dependence (HCC)  Total Time spent with patient: 20 minutes  Past Psychiatric History: See H&P  Past Medical History:  Past Medical History:  Diagnosis Date  . Depression   . Hepatitis C   . Hypertension     Past Surgical History:  Procedure Laterality Date  . TESTICLE TORSION REDUCTION     Family History:  Family History  Problem Relation Age of Onset  . COPD Father   . Hypertension Father   . Diabetes Mellitus II Father    Family Psychiatric  History: See H&P Social History:  Social History   Substance and Sexual Activity   Alcohol Use No   Comment: Occasional wine -bottle in 1-2 days     Social History   Substance and Sexual Activity  Drug Use Yes  . Types: IV, Heroin, Cocaine, Marijuana, Methamphetamines   Comment: pt states "all of them"    Social History   Socioeconomic History  . Marital status: Single    Spouse name: Not on file  . Number of children: Not on file  . Years of education: Not on file  . Highest education level: Not on file  Occupational History  . Not on file  Social Needs  . Financial resource strain: Not on file  . Food insecurity:    Worry: Not on file    Inability: Not on file  . Transportation needs:    Medical: Not on file    Non-medical: Not on file  Tobacco Use  . Smoking status: Current Every Day Smoker    Packs/day: 1.00    Types: Cigarettes  . Smokeless tobacco: Never Used  . Tobacco comment: Pt declines teaching  Substance and Sexual Activity  . Alcohol use: No    Comment: Occasional wine -bottle in 1-2 days  . Drug use: Yes    Types: IV, Heroin, Cocaine, Marijuana, Methamphetamines    Comment: pt states "all of them"  . Sexual activity: Not Currently  Lifestyle  . Physical activity:    Days per week: Not on file    Minutes per session: Not on file  . Stress: Not  on file  Relationships  . Social connections:    Talks on phone: Not on file    Gets together: Not on file    Attends religious service: Not on file    Active member of club or organization: Not on file    Attends meetings of clubs or organizations: Not on file    Relationship status: Not on file  Other Topics Concern  . Not on file  Social History Narrative  . Not on file   Additional Social History:    Pain Medications: See home med list (using herion for pain relief) Prescriptions: See home med list Over the Counter: See home med list History of alcohol / drug use?: Yes Longest period of sobriety (when/how long): about 2 years Negative Consequences of Use: Financial, Personal  relationships Name of Substance 1: Heroin 1 - Age of First Use: 30's 1 - Amount (size/oz): 2 grams IV 1 - Frequency: Daily 1 - Duration: Last two weeks. 1 - Last Use / Amount: 12/11 around 17:00 Name of Substance 2: Marijuana 2 - Age of First Use: Teens 2 - Amount (size/oz): Varies 2 - Frequency: Daily 2 - Duration: Last two weeks 2 - Last Use / Amount: 12/11 Name of Substance 3: Cocaine 3 - Age of First Use: Can't recall 3 - Amount (size/oz): Varies 3 - Frequency: Every few days  3 - Duration: Last two weeks 3 - Last Use / Amount: Few days ago.              Sleep: Fair improving  Appetite:  Good  Current Medications: Current Facility-Administered Medications  Medication Dose Route Frequency Provider Last Rate Last Dose  . acetaminophen (TYLENOL) tablet 650 mg  650 mg Oral Q6H PRN Kerry Hough, PA-C   650 mg at 04/26/18 9562  . alum & mag hydroxide-simeth (MAALOX/MYLANTA) 200-200-20 MG/5ML suspension 30 mL  30 mL Oral Q4H PRN Donell Sievert E, PA-C      . cloNIDine (CATAPRES) tablet 0.1 mg  0.1 mg Oral BH-qamhs Simon, Spencer E, PA-C   0.1 mg at 04/29/18 0820   Followed by  . [START ON 04/30/2018] cloNIDine (CATAPRES) tablet 0.1 mg  0.1 mg Oral QAC breakfast Kerry Hough, PA-C      . dicyclomine (BENTYL) tablet 20 mg  20 mg Oral Q6H PRN Donell Sievert E, PA-C   20 mg at 04/28/18 2102  . gabapentin (NEURONTIN) capsule 400 mg  400 mg Oral TID Cobos, Rockey Situ, MD   400 mg at 04/29/18 0811  . hydrOXYzine (ATARAX/VISTARIL) tablet 25 mg  25 mg Oral Q6H PRN Kerry Hough, PA-C   25 mg at 04/28/18 2102  . Influenza vac split quadrivalent PF (FLUARIX) injection 0.5 mL  0.5 mL Intramuscular Tomorrow-1000 Simon, Spencer E, PA-C      . lisinopril (PRINIVIL,ZESTRIL) tablet 10 mg  10 mg Oral Daily Malvin Johns, MD   10 mg at 04/29/18 1308  . loperamide (IMODIUM) capsule 2-4 mg  2-4 mg Oral PRN Donell Sievert E, PA-C   4 mg at 04/28/18 1401  . magnesium hydroxide (MILK OF  MAGNESIA) suspension 30 mL  30 mL Oral Daily PRN Donell Sievert E, PA-C      . methocarbamol (ROBAXIN) tablet 750 mg  750 mg Oral TID Antonieta Pert, MD   750 mg at 04/29/18 6578  . naproxen (NAPROSYN) tablet 500 mg  500 mg Oral BID PRN Kerry Hough, PA-C   500 mg at 04/28/18 2102  .  nicotine polacrilex (NICORETTE) gum 2 mg  2 mg Oral PRN Malvin Johns, MD   2 mg at 04/28/18 9629  . ondansetron (ZOFRAN-ODT) disintegrating tablet 4 mg  4 mg Oral Q6H PRN Kerry Hough, PA-C   4 mg at 04/27/18 2118  . propranolol (INDERAL) tablet 20 mg  20 mg Oral Daily Malvin Johns, MD   20 mg at 04/29/18 5284  . QUEtiapine (SEROQUEL) tablet 300 mg  300 mg Oral QHS Jules Baty, Gerlene Burdock, FNP   300 mg at 04/28/18 2102  . sertraline (ZOLOFT) tablet 50 mg  50 mg Oral Daily Timmie Dugue, Gerlene Burdock, FNP   50 mg at 04/29/18 1324    Lab Results: No results found for this or any previous visit (from the past 48 hour(s)).  Blood Alcohol level:  Lab Results  Component Value Date   ETH <10 04/25/2018   ETH <10 09/02/2017    Metabolic Disorder Labs: No results found for: HGBA1C, MPG No results found for: PROLACTIN No results found for: CHOL, TRIG, HDL, CHOLHDL, VLDL, LDLCALC  Physical Findings: AIMS: Facial and Oral Movements Muscles of Facial Expression: None, normal Lips and Perioral Area: None, normal Jaw: None, normal Tongue: None, normal,Extremity Movements Upper (arms, wrists, hands, fingers): None, normal Lower (legs, knees, ankles, toes): None, normal, Trunk Movements Neck, shoulders, hips: None, normal, Overall Severity Severity of abnormal movements (highest score from questions above): None, normal Incapacitation due to abnormal movements: None, normal Patient's awareness of abnormal movements (rate only patient's report): No Awareness, Dental Status Current problems with teeth and/or dentures?: No Does patient usually wear dentures?: No  CIWA:    COWS:  COWS Total Score:  6  Musculoskeletal: Strength & Muscle Tone: within normal limits Gait & Station: normal Patient leans: N/A  Psychiatric Specialty Exam: Physical Exam  Nursing note and vitals reviewed. Constitutional: He is oriented to person, place, and time. He appears well-developed and well-nourished.  Cardiovascular: Normal rate.  Respiratory: Effort normal.  Musculoskeletal: Normal range of motion.  Neurological: He is alert and oriented to person, place, and time.  Skin: Skin is warm.    Review of Systems  Constitutional: Negative.   HENT: Negative.   Eyes: Negative.   Respiratory: Negative.   Cardiovascular: Negative.   Gastrointestinal: Negative.   Genitourinary: Negative.   Musculoskeletal: Negative.   Skin: Negative.   Neurological: Negative.   Endo/Heme/Allergies: Negative.   Psychiatric/Behavioral: Positive for depression, substance abuse and suicidal ideas. Negative for hallucinations. The patient has insomnia.     Blood pressure 128/82, pulse 85, temperature 99.1 F (37.3 C), temperature source Oral, resp. rate 18, height 6\' 1"  (1.854 m), weight 108.9 kg.Body mass index is 31.66 kg/m.  General Appearance: Casual  Eye Contact:  Good  Speech:  Clear and Coherent and Normal Rate  Volume:  Normal  Mood:  Depressed  Affect:  Congruent  Thought Process:  Coherent and Descriptions of Associations: Intact  Orientation:  Full (Time, Place, and Person)  Thought Content:  WDL  Suicidal Thoughts:  Still has thoughts of being better off dead  Homicidal Thoughts:  No  Memory:  Immediate;   Good Recent;   Good Remote;   Good  Judgement:  Fair  Insight:  Good  Psychomotor Activity:  Normal  Concentration:  Concentration: Good and Attention Span: Good  Recall:  Good  Fund of Knowledge:  Good  Language:  Good  Akathisia:  No  Handed:  Right  AIMS (if indicated):     Assets:  Communication  Skills Desire for Improvement Housing Physical Health Social Support Transportation   ADL's:  Intact  Cognition:  WNL  Sleep:  Number of Hours: 6.25   Problems addressed MDD severe recurrent Polysubstance dependence  Treatment Plan Summary: Daily contact with patient to assess and evaluate symptoms and progress in treatment, Medication management and Plan is to:  Continue clonidine detox protocol Continue Neurontin 400 mg p.o. 3 times daily for withdrawal symptoms and mood stability Continue Vistaril 25 mg p.o. every 6 hours as needed for anxiety Continue Seroquel 300 mg p.o. nightly for mood stability Continue Zoloft 50 mg p.o. daily for mood stability Encourage group therapy participation  Maryfrances Bunnell, FNP 04/29/2018, 10:43 AM

## 2018-04-29 NOTE — Progress Notes (Signed)
Patient has been up and active on the unit, attended group this evening and reportsw feeling a little better each day. Safety maintained on unit, will continue to monitor.

## 2018-04-29 NOTE — Progress Notes (Signed)
Patient observed in the dayroom having his vitals taken so he could receive his medication. He reports that he has experienced hot and cold chills today but it has been manageable. He reports that he may discharge on tomorrow and plans  To stay with friends for a little bit. He reports that he will contact his sponsor and know that he is going out of town on Wednesday. He also mentioned getting information for [facility] as his option.Support given and safety maintain ed on unit.

## 2018-04-29 NOTE — Plan of Care (Signed)
  Problem: Education: Goal: Emotional status will improve Outcome: Progressing   

## 2018-04-29 NOTE — Progress Notes (Signed)
D Pt is observed OOB UAL on the 300 hall today. His demeanor is slightly less loud, he yells less, he is a little less agitated and less aggressive. He says " I still feel like s---". He attends his groups, he interacts boiterously and loudly with is peers as well as staff, and he gets a llittle upset when Clinical research associatewriter reidirescts him when he cusses loudly , but he is able to modulate his behavior and he cusses much less today.     He did complete his dialy assessment and on this he wrote he has experienced SI today " I think about it every day" but he is able to understand and process and he verbalizes that he can committ to not acting on nay of these thoughts- if they were to present themsleves while he is hospitalized. HE is engaged in the Life SKIlls class today and shares person feelings of times in his life hwen he has not responded  In a helathy way and he shares his thoughts about how he would change those situaitons --if he could. Writer encourged him to contiue to focus on his needs and hwo he can learn to communicate in a healtheir manner.     R Safety is in place.

## 2018-04-30 MED ORDER — GABAPENTIN 400 MG PO CAPS: 400.0000 mg | ORAL_CAPSULE | Freq: Three times a day (TID) | ORAL | 1 refills | Status: DC

## 2018-04-30 MED ORDER — SERTRALINE HCL 50 MG PO TABS: 50.0000 mg | ORAL_TABLET | Freq: Every day | ORAL | 1 refills | Status: DC

## 2018-04-30 MED ORDER — QUETIAPINE FUMARATE 300 MG PO TABS: 300.0000 mg | ORAL_TABLET | Freq: Every day | ORAL | 2 refills | Status: DC

## 2018-04-30 NOTE — Progress Notes (Signed)
  [facility] :  Will you be returning to the same living situation after discharge:  Yes,  home At discharge, do you have transportation home?: Yes,  friend Do you have the ability to pay for your medications: Yes,  mental health  Release of information consent forms completed and submitted to medical records by CSW.   Patient to Follow up at: Follow-up Information    [facility]   Why:  Hospital follow-up appt with Dr. Christin FudgeSteele at 11:30AM on Tuesday, 05/01/18. Please bring hospital discharge paperwork to this appt. Thank you.  Contact information: [address] [place], [place] [address] Phone: [phone] Fax: [phone]          Next level of care provider has access to [facility] Link:no  Safety Planning and Suicide Prevention discussed: Yes,  SPE completed with pt; pt declined to consent to collateral contact. SPI pamphlet and mobile crisis information provided to pt.   Have you used any form of tobacco in the last 30 days? (Cigarettes, Smokeless Tobacco, Cigars, and/or Pipes): Yes  Has patient been referred to the Quitline?: Patient refused referral  Patient has been referred for addiction treatment: Yes  Rona RavensHeather S Milanni Ayub, LCSW 04/30/2018, 9:37 AM

## 2018-04-30 NOTE — [facility] Suicide Risk Assessment (Signed)
[facility]   Principal Problem: MDD (major depressive disorder), recurrent episode, severe (HCC) Discharge Diagnoses: Principal Problem:   MDD (major depressive disorder), recurrent episode, severe (HCC) Active Problems:   Polysubstance (including opioids) dependence with physiological dependence (HCC)   Total Time spent with patient: 45 minutes   Mental Status Per Nursing Assessment::   On Admission:  Suicidal ideation indicated by patient, Self-harm thoughts  Demographic Factors:  Male  Loss Factors: Decrease in vocational status  Historical Factors: Prior suicide attempts  Risk Reduction Factors:   Positive coping skills or problem solving skills  Continued Clinical Symptoms:  Alcohol/Substance Abuse/Dependencies  Cognitive Features That Contribute To Risk:  Polarized thinking    Suicide Risk:  Minimal: No identifiable suicidal ideation.  Patients presenting with no risk factors but with morbid ruminations; may be classified as minimal risk based on the severity of the depressive symptoms  Follow-up Information    [facility].   Why:  if he does not want residential, pt needs follow-up for methadone maintenance with Dr. Christin FudgeSteele.  Contact information: [address] [place], [place] [address] Phone: [phone] Fax: [phone]          Plan Of Care/Follow-up recommendations:  Activity:  nl  Malvin JohnsFARAH,Eloyce Bultman, MD 04/30/2018, 9:18 AM
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
## 2018-04-30 NOTE — Progress Notes (Signed)
Patient discharged to lobby. Patient was stable and appreciative at that time. All papers and prescriptions were given and valuables returned. Verbal understanding expressed. Denies SI/HI and A/VH. Patient given opportunity to express concerns and ask questions.  Refused to wait for samples.  Suicide prevention pamphlet discussed and given to patient upon discharge.

## 2018-05-20 DIAGNOSIS — F1020 Alcohol dependence, uncomplicated: Secondary | ICD-10-CM | POA: Insufficient documentation

## 2019-03-01 DIAGNOSIS — F15959 Other stimulant use, unspecified with stimulant-induced psychotic disorder, unspecified: Secondary | ICD-10-CM | POA: Insufficient documentation

## 2019-03-04 DIAGNOSIS — F1110 Opioid abuse, uncomplicated: Secondary | ICD-10-CM | POA: Insufficient documentation

## 2019-03-04 DIAGNOSIS — F1510 Other stimulant abuse, uncomplicated: Secondary | ICD-10-CM | POA: Insufficient documentation

## 2019-03-04 DIAGNOSIS — F23 Brief psychotic disorder: Secondary | ICD-10-CM | POA: Insufficient documentation

## 2019-12-02 ENCOUNTER — Encounter: Payer: Self-pay | Admitting: Family

## 2019-12-02 ENCOUNTER — Telehealth: Payer: Self-pay | Admitting: Pharmacy Technician

## 2019-12-02 NOTE — Telephone Encounter (Signed)
[facility] Patient Advocate Encounter ° °Insurance verification completed.   ° °The patient is uninsured and will need patient assistance for medication. ° °We can complete the application and will need to meet with the patient for signatures and income documentation. ° °Kelcey Korus E. Copeland Neisen, CPhT °Specialty Pharmacy Patient Advocate °[facility] °Phone: [phone] °Fax:  [phone] ° ° °

## 2021-06-25 ENCOUNTER — Encounter (HOSPITAL_COMMUNITY): Payer: Self-pay

## 2021-06-25 ENCOUNTER — Ambulatory Visit (HOSPITAL_COMMUNITY)
Admission: EM | Admit: 2021-06-25 | Discharge: 2021-06-25 | Disposition: A | Discharge status: Home / Self Care | Payer: Self-pay | Attending: Family Medicine | Admitting: Family Medicine

## 2021-06-25 ENCOUNTER — Ambulatory Visit (INDEPENDENT_AMBULATORY_CARE_PROVIDER_SITE_OTHER): Payer: Self-pay

## 2021-06-25 DIAGNOSIS — F17210 Nicotine dependence, cigarettes, uncomplicated: Secondary | ICD-10-CM | POA: Insufficient documentation

## 2021-06-25 DIAGNOSIS — R0602 Shortness of breath: Secondary | ICD-10-CM | POA: Insufficient documentation

## 2021-06-25 DIAGNOSIS — J069 Acute upper respiratory infection, unspecified: Secondary | ICD-10-CM

## 2021-06-25 DIAGNOSIS — R059 Cough, unspecified: Secondary | ICD-10-CM

## 2021-06-25 DIAGNOSIS — R509 Fever, unspecified: Secondary | ICD-10-CM

## 2021-06-25 DIAGNOSIS — Z20822 Contact with and (suspected) exposure to covid-19: Secondary | ICD-10-CM | POA: Insufficient documentation

## 2021-06-25 DIAGNOSIS — Z8616 Personal history of COVID-19: Secondary | ICD-10-CM | POA: Insufficient documentation

## 2021-06-25 LAB — SARS CORONAVIRUS 2 (TAT 6-24 HRS): SARS Coronavirus 2: NEGATIVE

## 2021-06-25 MED ORDER — ALBUTEROL SULFATE HFA 108 (90 BASE) MCG/ACT IN AERS: 2.0000 | INHALATION_SPRAY | RESPIRATORY_TRACT | 0 refills | Status: DC | PRN

## 2021-06-25 MED ORDER — BENZONATATE 200 MG PO CAPS: 200.0000 mg | ORAL_CAPSULE | Freq: Three times a day (TID) | ORAL | 0 refills | Status: DC

## 2021-06-25 NOTE — ED Provider Notes (Signed)
[facility]    CSN: [id number] Arrival date & time: 06/25/21  1218      History   Chief Complaint Chief Complaint  Patient presents with   Cough   Nasal Congestion   Fever    HPI Todd [name] is a 46 y.o. male.   Patient is here for cough, congestion, fever, body aches x 3 days.   Yesterday was 101.   No sore throat, no ear pain.  + wheezing and sob.  He does smoke 1/2 ppd;  No h/o asthma.  + covid exposures;  He did not do a home covid test.  He has had covid in the past;  he did have pneumonia with the last covid infection.  He has not been treated for covid in the past.  He is also on naltrexone as he is in recovery.   Past Medical History:  Diagnosis Date   Depression    Hepatitis C    Hypertension     Patient Active Problem List   Diagnosis Date Noted   MDD (major depressive disorder), recurrent episode, severe (HCC) 04/26/2018   AKI (acute kidney injury) (HCC) 09/01/2016   Rhabdomyolysis 09/01/2016   Essential hypertension 09/01/2016   Left flank pain    Major depressive disorder, 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   Polysubstance (including opioids) dependence with physiological dependence (HCC) 03/10/2016   Substance induced mood disorder (HCC) 02/26/2013   PTSD (post-traumatic stress disorder) 02/26/2013   Polysubstance dependence including opioid type drug, episodic abuse (HCC) 02/24/2013   Alcohol abuse 02/24/2013    Past Surgical History:  Procedure Laterality Date   TESTICLE TORSION REDUCTION         Home Medications    Prior to Admission medications   Medication Sig Start Date End Date Taking? Authorizing Provider  DULoxetine (CYMBALTA) 20 MG capsule Take 20 mg by mouth daily.   Yes [provider]  gabapentin (NEURONTIN) 400 MG capsule Take 1 capsule (400 mg total) by mouth 3 (three) times daily. 04/30/18   Malvin Johns, MD  lisinopril (PRINIVIL,ZESTRIL) 10 MG tablet Take 1 tablet (10 mg  total) by mouth daily. 04/14/18   Merrily Brittle, MD  propranolol (INDERAL) 20 MG tablet Take 1 tablet (20 mg total) by mouth 2 (two) times daily. 04/14/18   Merrily Brittle, MD  QUEtiapine (SEROQUEL) 300 MG tablet Take 1 tablet (300 mg total) by mouth at bedtime. 04/30/18   Malvin Johns, MD  sertraline (ZOLOFT) 50 MG tablet Take 1 tablet (50 mg total) by mouth daily. Patient not taking: Reported on 06/25/2021 05/01/18   Malvin Johns, MD    Family History Family History  Problem Relation Age of Onset   COPD Father    Hypertension Father    Diabetes Mellitus II Father     Social History Social History   Tobacco Use   Smoking status: Every Day    Packs/day: 1.00    Types: Cigarettes   Smokeless tobacco: Never   Tobacco comments:    Pt declines teaching  Vaping Use   Vaping Use: Never used  Substance Use Topics   Alcohol use: No    Comment: Occasional wine -bottle in 1-2 days   Drug use: Yes    Types: IV, Heroin, Cocaine, Marijuana, Methamphetamines    Comment: pt states "all of them"     Allergies   Fish allergy and Penicillins   Review of Systems Review of Systems  Constitutional:  Positive for fever.  HENT:  Positive for congestion and rhinorrhea. Negative for ear pain and sore throat.   Respiratory:  Positive for cough and wheezing.   Cardiovascular: Negative.   Gastrointestinal: Negative.     Physical Exam Triage Vital Signs ED Triage Vitals  Enc Vitals Group     BP 06/25/21 1243 (!) 143/92     Pulse Rate 06/25/21 1243 62     Resp 06/25/21 1243 16     Temp 06/25/21 1243 99.2 F (37.3 C)     Temp Source 06/25/21 1243 Oral     SpO2 06/25/21 1243 96 %     Weight --      Height --      Head Circumference --      Peak Flow --      Pain Score 06/25/21 1244 0     Pain Loc --      Pain Edu? --      Excl. in GC? --    No data found.  Updated Vital Signs BP (!) 143/92 (BP Location: Right Arm)    Pulse 62    Temp 99.2 F (37.3 C) (Oral)    Resp 16     SpO2 96%   Visual Acuity Right Eye Distance:   Left Eye Distance:   Bilateral Distance:    Right Eye Near:   Left Eye Near:    Bilateral Near:     Physical Exam Constitutional:      Appearance: Normal appearance.  HENT:     Head: Normocephalic and atraumatic.     Right Ear: Tympanic membrane normal.     Left Ear: Tympanic membrane normal.     Nose: Congestion present.  Cardiovascular:     Rate and Rhythm: Normal rate and regular rhythm.  Pulmonary:     Effort: Pulmonary effort is normal.     Breath sounds: Wheezing present.  Musculoskeletal:     Cervical back: Neck supple.  Neurological:     Mental Status: He is alert.     UC Treatments / Results  Labs (all labs ordered are listed, but only abnormal results are displayed) Labs Reviewed - No data to display  EKG   Radiology DG Chest 2 View  Result Date: 06/25/2021 CLINICAL DATA:  Body aches, fever, cough and congestion for 3 days, positive for COVID-19 and RSV exposure EXAM: CHEST - 2 VIEW COMPARISON:  None FINDINGS: Normal heart size, mediastinal contours, and pulmonary vascularity. Lungs clear. No pulmonary infiltrate, pleural effusion, or pneumothorax. Lower lateral LEFT costal margin excluded. No acute osseous findings. IMPRESSION: No acute abnormalities. Electronically Signed   By: Ulyses Southward M.D.   On: 06/25/2021 13:14    Procedures Procedures (including critical care time)  Medications Ordered in UC Medications - No data to display  Initial Impression / Assessment and Plan / UC Course  I have reviewed the triage vital signs and the nursing notes.  Pertinent labs & imaging results that were available during my care of the patient were reviewed by me and considered in my medical decision making (see chart for details).    Patient seen for uri symptoms after covid exposure.  Xray today was negative for pneumonia.  Covid swab pending.  Albuterol hfa and tessalon to the pharmacy.  I also recommend otc  supportive care.    Final Clinical Impressions(s) / UC Diagnoses   Final diagnoses:  Fever, unspecified  Viral URI with cough     Discharge Instructions      You were seen today  for upper respiratory symptoms.  Your chest xray was normal today without showing any pneumonia.  Your covid swab is pending and will be resulted tomorrow.  We will call you if this is positive.  I have sent out medication to help with your symptoms.   This includes an inhaler and pills to help with your cough.  You may use tylenol for pain/fever as well.  Please get plenty of rest and fluids.  Please follow up if you are not improving over the next several days.     ED Prescriptions     Medication Sig Dispense Auth. Provider   benzonatate (TESSALON) 200 MG capsule Take 1 capsule (200 mg total) by mouth 3 (three) times daily. 30 capsule Shalamar Plourde, MD   albuterol (VENTOLIN HFA) 108 (90 Base) MCG/ACT inhaler Inhale 2 puffs into the lungs every 4 (four) hours as needed for wheezing or shortness of breath. 1 each Jannifer Franklin, MD      PDMP not reviewed this encounter.   Jannifer Franklin, MD 06/25/21 1335

## 2021-06-25 NOTE — Discharge Instructions (Addendum)
You were seen today for upper respiratory symptoms.  Your chest xray was normal today without showing any pneumonia.  Your covid swab is pending and will be resulted tomorrow.  We will call you if this is positive.  I have sent out medication to help with your symptoms.   This includes an inhaler and pills to help with your cough.  You may use tylenol for pain/fever as well.  Please get plenty of rest and fluids.  Please follow up if you are not improving over the next several days.

## 2021-06-25 NOTE — ED Triage Notes (Signed)
Pt presents with body aches, fever, cough and congestion x3 days. Pt has positive covid and RSV exposure.

## 2022-03-08 ENCOUNTER — Encounter (HOSPITAL_BASED_OUTPATIENT_CLINIC_OR_DEPARTMENT_OTHER): Payer: Self-pay | Admitting: Emergency Medicine

## 2022-03-08 ENCOUNTER — Emergency Department (HOSPITAL_BASED_OUTPATIENT_CLINIC_OR_DEPARTMENT_OTHER)
Admission: EM | Admit: 2022-03-08 | Discharge: 2022-03-08 | Disposition: A | Discharge status: Home / Self Care | Payer: 59 | Attending: Emergency Medicine | Admitting: Emergency Medicine

## 2022-03-08 ENCOUNTER — Other Ambulatory Visit: Payer: Self-pay

## 2022-03-08 DIAGNOSIS — S61213A Laceration without foreign body of left middle finger without damage to nail, initial encounter: Secondary | ICD-10-CM | POA: Insufficient documentation

## 2022-03-08 DIAGNOSIS — S6992XA Unspecified injury of left wrist, hand and finger(s), initial encounter: Secondary | ICD-10-CM | POA: Diagnosis present

## 2022-03-08 DIAGNOSIS — W293XXA Contact with powered garden and outdoor hand tools and machinery, initial encounter: Secondary | ICD-10-CM | POA: Diagnosis not present

## 2022-03-08 NOTE — ED Provider Notes (Signed)
[facility] EMERGENCY DEPT Provider Note   CSN: [id number] Arrival date & time: 03/08/22  1056     History  Chief Complaint  Patient presents with   Extremity Laceration    Todd [name] is a 46 y.o. male who presents to the Emergency Department complaining of left middle finger laceration onset PTA. He cut his finger while using hedge trimmers PTA at work. Pt is UTD with his tetanus vaccine with his last one being last year per patient. No meds tried PTA. Denies color change, swelling. No anticoagulant use.    The history is provided by the patient. No language interpreter was used.       Home Medications Prior to Admission medications   Medication Sig Start Date End Date Taking? Authorizing Provider  albuterol (VENTOLIN HFA) 108 (90 Base) MCG/ACT inhaler Inhale 2 puffs into the lungs every 4 (four) hours as needed for wheezing or shortness of breath. 06/25/21   Piontek, Denny Peon, MD  benzonatate (TESSALON) 200 MG capsule Take 1 capsule (200 mg total) by mouth 3 (three) times daily. 06/25/21   Piontek, Denny Peon, MD  chlorthalidone (HYGROTON) 25 MG tablet Take 12.5 mg by mouth daily. 03/05/22   [provider]  DULoxetine (CYMBALTA) 20 MG capsule Take 20 mg by mouth daily.    [provider]  gabapentin (NEURONTIN) 400 MG capsule Take 1 capsule (400 mg total) by mouth 3 (three) times daily. 04/30/18   Malvin Johns, MD  lisinopril (PRINIVIL,ZESTRIL) 10 MG tablet Take 1 tablet (10 mg total) by mouth daily. 04/14/18   Merrily Brittle, MD  lisinopril (ZESTRIL) 20 MG tablet Take 20 mg by mouth daily. 03/05/22   [provider]  propranolol (INDERAL) 20 MG tablet Take 1 tablet (20 mg total) by mouth 2 (two) times daily. 04/14/18   Merrily Brittle, MD  QUEtiapine (SEROQUEL) 300 MG tablet Take 1 tablet (300 mg total) by mouth at bedtime. 04/30/18   Malvin Johns, MD  sertraline (ZOLOFT) 50 MG tablet Take by mouth daily. 03/05/22   [provider]       Allergies    Fish allergy and Penicillins    Review of Systems   Review of Systems  Musculoskeletal:  Negative for arthralgias and joint swelling.  Skin:  Positive for wound. Negative for color change.  All other systems reviewed and are negative.   Physical Exam Updated Vital Signs BP (!) 141/96 (BP Location: Right Arm)   Pulse 72   Temp 98.1 F (36.7 C) (Oral)   Resp 20   Ht 6\' 1"  (1.854 m)   Wt 108.9 kg   SpO2 98%   BMI 31.66 kg/m  Physical Exam Vitals and nursing note reviewed.  Constitutional:      General: He is not in acute distress.    Appearance: Normal appearance. He is not ill-appearing.  HENT:     Head: Normocephalic and atraumatic.     Right Ear: External ear normal.     Left Ear: External ear normal.  Eyes:     General: No scleral icterus. Cardiovascular:     Rate and Rhythm: Normal rate.  Pulmonary:     Effort: Pulmonary effort is normal.  Musculoskeletal:        General: Normal range of motion.     Cervical back: Normal range of motion and neck supple.     Comments: FROM of left hand. Grip strength 5/5. Able to flex and extend fingers against resistance. Capillary refill <2 seconds. NVI.   Skin:  General: Skin is warm and dry.     Capillary Refill: Capillary refill takes less than 2 seconds.     Findings: Laceration present.     Comments: 2 cm laceration noted to distal phalanx of left third digit.  Neurological:     Mental Status: He is alert.     ED Results / Procedures / Treatments   Labs (all labs ordered are listed, but only abnormal results are displayed) Labs Reviewed - No data to display  EKG None  Radiology No results found.  Procedures .Marland KitchenLaceration Repair  Date/Time: 03/08/2022 12:24 PM  Performed by: Nehemiah Settle, PA-C Authorized by: Nehemiah Settle, PA-C   Consent:    Consent obtained:  Verbal   Consent given by:  Patient   Risks discussed:  Infection and pain Universal protocol:    Patient identity  confirmed:  Verbally with patient and hospital-assigned identification number Anesthesia:    Anesthesia method:  None Laceration details:    Location:  Finger   Finger location:  L long finger   Length (cm):  2 Pre-procedure details:    Preparation:  Patient was prepped and draped in usual sterile fashion Exploration:    Hemostasis achieved with:  Direct pressure   Imaging outcome: foreign body not noted     Wound exploration: entire depth of wound visualized   Treatment:    Area cleansed with:  Saline   Amount of cleaning:  Standard   Irrigation solution:  Sterile saline   Irrigation method:  Syringe Skin repair:    Repair method:  Tissue adhesive Approximation:    Approximation:  Close Repair type:    Repair type:  Simple Post-procedure details:    Dressing:  Open (no dressing)   Procedure completion:  Tolerated well, no immediate complications     Medications Ordered in ED Medications - No data to display  ED Course/ Medical Decision Making/ A&P                           Medical Decision Making  Patient presents with laceration noted to left middle finger PTA. Pt is not on anticoagulants at this time. Vital signs, pt afebrile. On exam, patient with FROM of left hand. Grip strength 5/5. Able to flex and extend fingers against resistance. Capillary refill <2 seconds. NVI. 2 cm laceration noted  to distal phalanx of left 3rd digit. Tetanus UTD. Laceration occurred < 12 hours prior to repair. Differential diagnosis includes, fracture, foreign body, dislocation, avulsion.    Disposition: Presentation notable for laceration to finger. Doubt fracture, dislocation, or foreign body at this time. Tetanus UTD. Wound thoroughly irrigated, no foreign bodies noted. Laceration repaired in the ED today. After consideration of the diagnostic results and the patients response to treatment, I feel that the patient would benefit from Discharge home. Discussed laceration care with pt and  answered questions. Wound repaired with dermabond. Pt to follow up for wound check sooner should there be signs of dehiscence or infection. Pt is hemodynamically stable with no complaints prior to discharge. Supportive care measures and strict return precautions discussed with patient at bedside. Pt acknowledges and verbalizes understanding. Pt appears safe for discharge. Follow up as indicated in discharge paperwork.    This chart was dictated using voice recognition software, Dragon. Despite the best efforts of this provider to proofread and correct errors, errors may still occur which can change documentation meaning.   Final Clinical Impression(s) / ED Diagnoses  Final diagnoses:  Laceration of left middle finger without foreign body without damage to nail, initial encounter    Rx / DC Orders ED Discharge Orders     None         Ilianna Bown A, PA-C 03/08/22 1259    Melene Plan, DO 03/08/22 1321

## 2022-03-08 NOTE — ED Triage Notes (Signed)
Pt via pov from home with laceration to left middle finger. Pt reports that he was at work using hedge trimmers and cut the finger. Pt has finger wrapped in shirt; will bandage at triage. Pt alert & oriented, nad noted.

## 2022-03-08 NOTE — ED Notes (Signed)
Pt left before discharge paperwork could be provided and explained. Unknown if wound was wrapped.

## 2022-03-08 NOTE — Discharge Instructions (Addendum)
It was a pleasure taking care of you today!   Your cut was repaired with dermabond (skin glue) today in the ED. It is important that you do not soak the wound in water, keep the area clean and dry. If the area gets wet, then pat the area dry, do not rub the area. Do not apply lotions to the area. The skin glue will fall off naturally on its own within 5-10 days. Return to the emergency department if worsening or persistent pain, drainage of wound, increased swelling, or color change to area.

## 2022-04-05 ENCOUNTER — Encounter (HOSPITAL_COMMUNITY): Payer: Self-pay | Admitting: Emergency Medicine

## 2022-04-05 ENCOUNTER — Ambulatory Visit (HOSPITAL_COMMUNITY)
Admission: EM | Admit: 2022-04-05 | Discharge: 2022-04-05 | Disposition: A | Discharge status: Home / Self Care | Payer: 59 | Attending: Emergency Medicine | Admitting: Emergency Medicine

## 2022-04-05 DIAGNOSIS — Z760 Encounter for issue of repeat prescription: Secondary | ICD-10-CM

## 2022-04-05 DIAGNOSIS — I10 Essential (primary) hypertension: Secondary | ICD-10-CM

## 2022-04-05 DIAGNOSIS — F329 Major depressive disorder, single episode, unspecified: Secondary | ICD-10-CM | POA: Diagnosis not present

## 2022-04-05 DIAGNOSIS — S8991XA Unspecified injury of right lower leg, initial encounter: Secondary | ICD-10-CM | POA: Diagnosis not present

## 2022-04-05 MED ORDER — SERTRALINE HCL 50 MG PO TABS: 50.0000 mg | ORAL_TABLET | Freq: Every day | ORAL | 0 refills | Status: DC

## 2022-04-05 MED ORDER — QUETIAPINE FUMARATE 300 MG PO TABS: 300.0000 mg | ORAL_TABLET | Freq: Every day | ORAL | 0 refills | Status: DC

## 2022-04-05 MED ORDER — LISINOPRIL 20 MG PO TABS: 20.0000 mg | ORAL_TABLET | Freq: Every day | ORAL | 0 refills | Status: DC

## 2022-04-05 MED ORDER — CHLORTHALIDONE 25 MG PO TABS: 12.5000 mg | ORAL_TABLET | Freq: Every day | ORAL | 0 refills | Status: DC

## 2022-04-05 MED ORDER — GABAPENTIN 400 MG PO CAPS: 400.0000 mg | ORAL_CAPSULE | Freq: Three times a day (TID) | ORAL | 0 refills | Status: DC | PRN

## 2022-04-05 NOTE — ED Triage Notes (Signed)
Pt reports right knee pain x 1 month. States he was walking one day and his right knee twisted. Wears a knee brace that he feels is helping with pain.   Also requesting a medication refill on Gabapentin, Quetiapine 300 mg, Lisinopril 20 mg, Chlorthalidone 25 mg and Sertraline 50 mg.

## 2022-04-05 NOTE — ED Provider Notes (Signed)
[facility]    CSN: [id number] Arrival date & time: 04/05/22  9767      History   Chief Complaint Chief Complaint  Patient presents with   Medication Refill   Knee Pain    HPI Todd [name] is a 46 y.o. male.  Presents with right knee injury Reports he twisted it 1 month ago, did not fall Denies any trauma or blunt injury Has been using knee brace with some relief Ibuprofen helps relieve pain for a few hours He works in [employer] and goes up and down ladders all day  Just finished rehab 1 month ago for polysubstance abuse Was put on Seroquel and Zoloft, requesting refills Additionally ran out of HTN meds this morning Out of gabapentin for sciatica Does not have a PCP  Past Medical History:  Diagnosis Date   Depression    Hepatitis C    Hypertension     Patient Active Problem List   Diagnosis Date Noted   MDD (major depressive disorder), recurrent episode, severe (HCC) 04/26/2018   AKI (acute kidney injury) (HCC) 09/01/2016   Rhabdomyolysis 09/01/2016   Essential hypertension 09/01/2016   Left flank pain    Major depressive disorder, 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   Polysubstance (including opioids) dependence with physiological dependence (HCC) 03/10/2016   Substance induced mood disorder (HCC) 02/26/2013   PTSD (post-traumatic stress disorder) 02/26/2013   Polysubstance dependence including opioid type drug, episodic abuse (HCC) 02/24/2013   Alcohol abuse 02/24/2013    Past Surgical History:  Procedure Laterality Date   TESTICLE TORSION REDUCTION         Home Medications    Prior to Admission medications   Medication Sig Start Date End Date Taking? Authorizing Provider  albuterol (VENTOLIN HFA) 108 (90 Base) MCG/ACT inhaler Inhale 2 puffs into the lungs every 4 (four) hours as needed for wheezing or shortness of breath. 06/25/21   Piontek, Denny Peon, MD  chlorthalidone (HYGROTON) 25 MG tablet Take 0.5 tablets  (12.5 mg total) by mouth daily for 14 days. 04/05/22 04/19/22  Johnella Crumm, Lurena Joiner, PA-C  gabapentin (NEURONTIN) 400 MG capsule Take 1 capsule (400 mg total) by mouth 3 (three) times daily as needed for up to 14 days. 04/05/22 04/19/22  Javeon Macmurray, PA-C  lisinopril (ZESTRIL) 20 MG tablet Take 1 tablet (20 mg total) by mouth daily for 14 days. 04/05/22 04/19/22  Leib Elahi, Lurena Joiner, PA-C  QUEtiapine (SEROQUEL) 300 MG tablet Take 1 tablet (300 mg total) by mouth at bedtime for 14 days. 04/05/22 04/19/22  Jazzelle Zhang, Lurena Joiner, PA-C  sertraline (ZOLOFT) 50 MG tablet Take 1 tablet (50 mg total) by mouth daily for 14 days. 04/05/22 04/19/22  Virgene Tirone, Lurena Joiner, PA-C    Family History Family History  Problem Relation Age of Onset   COPD Father    Hypertension Father    Diabetes Mellitus II Father     Social History Social History   Tobacco Use   Smoking status: Former    Packs/day: 1.00    Types: Cigarettes    Quit date: 03/08/2021    Years since quitting: 1.0   Smokeless tobacco: Never   Tobacco comments:    Pt declines teaching  Vaping Use   Vaping Use: Never used  Substance Use Topics   Alcohol use: Not Currently    Comment: Occasional wine -bottle in 1-2 days   Drug use: Not Currently    Types: IV, Heroin, Cocaine, Marijuana, Methamphetamines    Comment: on methadone  Allergies   Fish allergy and Penicillins   Review of Systems Review of Systems Per HPI  Physical Exam Triage Vital Signs ED Triage Vitals [04/05/22 0905]  Enc Vitals Group     BP 122/78     Pulse Rate 85     Resp 18     Temp 98.3 F (36.8 C)     Temp Source Oral     SpO2 95 %     Weight      Height      Head Circumference      Peak Flow      Pain Score 6     Pain Loc      Pain Edu?      Excl. in GC?    No data found.  Updated Vital Signs BP 122/78 (BP Location: Left Arm)   Pulse 85   Temp 98.3 F (36.8 C) (Oral)   Resp 18   SpO2 95%    Physical Exam Vitals and nursing note reviewed.   Constitutional:      General: He is not in acute distress.    Appearance: Normal appearance.     Comments: Pleasant and polite 47 year old male, no acute distress  HENT:     Mouth/Throat:     Pharynx: Oropharynx is clear.  Cardiovascular:     Rate and Rhythm: Normal rate and regular rhythm.     Pulses: Normal pulses.  Pulmonary:     Effort: Pulmonary effort is normal.  Musculoskeletal:        General: Normal range of motion.     Right knee: No deformity, erythema, bony tenderness or crepitus.     Comments: Minor swelling small area of right knee. No obvious deformity. No bony tenderness but some pain with palpation of soft tissues. Full ROM  Skin:    General: Skin is warm and dry.     Findings: No bruising.  Neurological:     Mental Status: He is alert and oriented to person, place, and time.  Psychiatric:        Mood and Affect: Mood normal.        Behavior: Behavior normal.      UC Treatments / Results  Labs (all labs ordered are listed, but only abnormal results are displayed) Labs Reviewed - No data to display  EKG  Radiology No results found.  Procedures Procedures   Medications Ordered in UC Medications - No data to display  Initial Impression / Assessment and Plan / UC Course  I have reviewed the triage vital signs and the nursing notes.  Pertinent labs & imaging results that were available during my care of the patient were reviewed by me and considered in my medical decision making (see chart for details).  Discussed x-ray, but patient symptoms seem more soft tissue vs bursitis. Shared decision making to defer xray today Discussed trying RICE therapy for now and following up with orthopedics. Provided contact info His knee brace looks stable and appropriate Ibuprofen every 6 hours as needed  Set up with PCP via open scheduling, visit is next week Refilled all medications with 2-week supply Return precautions discussed. Patient agrees to plan  Final  Clinical Impressions(s) / UC Diagnoses   Final diagnoses:  Injury of right knee, initial encounter  Medication refill  Essential hypertension  Major depressive disorder, remission status unspecified, unspecified whether recurrent     Discharge Instructions      Rest - try to avoid heavy lifting and  high impact activity Ice - apply for 20 minutes a few times daily Compression - use knee brace as needed Elevation - prop up on a pillow  Continue ibuprofen up to 800 mg every 6 hours for pain control  Follow up with the knee specialists - call to make an appointment or go to walk-in hours  Please follow up with your new primary care provider regarding medication refills and monitoring. I have refilled your prescriptions with 2 week supply.    ED Prescriptions     Medication Sig Dispense Auth. Provider   chlorthalidone (HYGROTON) 25 MG tablet Take 0.5 tablets (12.5 mg total) by mouth daily for 14 days. 7 tablet Klint Lezcano, PA-C   gabapentin (NEURONTIN) 400 MG capsule  (Status: Discontinued) Take 1 capsule (400 mg total) by mouth 3 (three) times daily as needed for up to 14 days. 42 capsule Dalbert Stillings, PA-C   lisinopril (ZESTRIL) 20 MG tablet Take 1 tablet (20 mg total) by mouth daily for 14 days. 14 tablet Sakari Raisanen, PA-C   QUEtiapine (SEROQUEL) 300 MG tablet  (Status: Discontinued) Take 1 tablet (300 mg total) by mouth at bedtime for 14 days. 14 tablet Bre Pecina, PA-C   sertraline (ZOLOFT) 50 MG tablet Take 1 tablet (50 mg total) by mouth daily for 14 days. 14 tablet Lynnwood Beckford, PA-C   gabapentin (NEURONTIN) 400 MG capsule Take 1 capsule (400 mg total) by mouth 3 (three) times daily as needed for up to 14 days. 42 capsule Mellina Benison, PA-C   QUEtiapine (SEROQUEL) 300 MG tablet Take 1 tablet (300 mg total) by mouth at bedtime for 14 days. 14 tablet Lonna Rabold, Lurena Joiner, PA-C      PDMP not reviewed this encounter.   Judi Jaffe, Lurena Joiner, PA-C 04/05/22 1125

## 2022-04-05 NOTE — Discharge Instructions (Addendum)
Rest - try to avoid heavy lifting and high impact activity Ice - apply for 20 minutes a few times daily Compression - use knee brace as needed Elevation - prop up on a pillow  Continue ibuprofen up to 800 mg every 6 hours for pain control  Follow up with the knee specialists - call to make an appointment or go to walk-in hours  Please follow up with your new primary care provider regarding medication refills and monitoring. I have refilled your prescriptions with 2 week supply.

## 2022-04-15 ENCOUNTER — Encounter: Payer: Self-pay | Admitting: Physician Assistant

## 2022-04-15 ENCOUNTER — Other Ambulatory Visit: Payer: Self-pay

## 2022-04-15 ENCOUNTER — Telehealth: Payer: Self-pay | Admitting: Physician Assistant

## 2022-04-15 ENCOUNTER — Ambulatory Visit: Payer: 59 | Admitting: Physician Assistant

## 2022-04-15 VITALS — BP 130/88 | HR 59 | Temp 98.4°F | Ht 73.0 in | Wt 245.6 lb

## 2022-04-15 DIAGNOSIS — Z1211 Encounter for screening for malignant neoplasm of colon: Secondary | ICD-10-CM | POA: Diagnosis not present

## 2022-04-15 DIAGNOSIS — I10 Essential (primary) hypertension: Secondary | ICD-10-CM | POA: Diagnosis not present

## 2022-04-15 DIAGNOSIS — F32A Depression, unspecified: Secondary | ICD-10-CM | POA: Insufficient documentation

## 2022-04-15 DIAGNOSIS — F1994 Other psychoactive substance use, unspecified with psychoactive substance-induced mood disorder: Secondary | ICD-10-CM

## 2022-04-15 DIAGNOSIS — F4310 Post-traumatic stress disorder, unspecified: Secondary | ICD-10-CM

## 2022-04-15 DIAGNOSIS — F1920 Other psychoactive substance dependence, uncomplicated: Secondary | ICD-10-CM

## 2022-04-15 DIAGNOSIS — F419 Anxiety disorder, unspecified: Secondary | ICD-10-CM | POA: Diagnosis not present

## 2022-04-15 DIAGNOSIS — K739 Chronic hepatitis, unspecified: Secondary | ICD-10-CM

## 2022-04-15 DIAGNOSIS — Z0000 Encounter for general adult medical examination without abnormal findings: Secondary | ICD-10-CM | POA: Diagnosis not present

## 2022-04-15 LAB — COMPREHENSIVE METABOLIC PANEL
ALT: 17 U/L (ref 0–53)
AST: 23 U/L (ref 0–37)
Albumin: 4.3 g/dL (ref 3.5–5.2)
Alkaline Phosphatase: 66 U/L (ref 39–117)
BUN: 14 mg/dL (ref 6–23)
CO2: 25 mEq/L (ref 19–32)
Calcium: 8.9 mg/dL (ref 8.4–10.5)
Chloride: 103 mEq/L (ref 96–112)
Creatinine, Ser: 0.98 mg/dL (ref 0.40–1.50)
GFR: 92.67 mL/min (ref >60.00)
Glucose, Bld: 92 mg/dL (ref 70–99)
Potassium: 4.1 mEq/L (ref 3.5–5.1)
Sodium: 137 mEq/L (ref 135–145)
Total Bilirubin: 0.5 mg/dL (ref 0.2–1.2)
Total Protein: 7.1 g/dL (ref 6.0–8.3)

## 2022-04-15 LAB — CBC WITH DIFFERENTIAL/PLATELET
Basophils Absolute: 0.1 10*3/uL (ref 0.0–0.1)
Basophils Relative: 0.9 % (ref 0.0–3.0)
Eosinophils Absolute: 1 10*3/uL — ABNORMAL HIGH (ref 0.0–0.7)
Eosinophils Relative: 12.7 % — ABNORMAL HIGH (ref 0.0–5.0)
HCT: 39.3 % (ref 39.0–52.0)
Hemoglobin: 13.3 g/dL (ref 13.0–17.0)
Lymphocytes Relative: 35.1 % (ref 12.0–46.0)
Lymphs Abs: 2.6 10*3/uL (ref 0.7–4.0)
MCHC: 33.9 g/dL (ref 30.0–36.0)
MCV: 85.8 fl (ref 78.0–100.0)
Monocytes Absolute: 0.5 10*3/uL (ref 0.1–1.0)
Monocytes Relative: 7.2 % (ref 3.0–12.0)
Neutro Abs: 3.3 10*3/uL (ref 1.4–7.7)
Neutrophils Relative %: 44.1 % (ref 43.0–77.0)
Platelets: 302 10*3/uL (ref 150.0–400.0)
RBC: 4.58 Mil/uL (ref 4.22–5.81)
RDW: 13.6 % (ref 11.5–15.5)
WBC: 7.6 10*3/uL (ref 4.0–10.5)

## 2022-04-15 LAB — LIPID PANEL
Cholesterol: 177 mg/dL (ref 0–200)
HDL: 45.9 mg/dL (ref >39.00)
LDL Cholesterol: 116 mg/dL — ABNORMAL HIGH (ref 0–99)
NonHDL: 130.69
Total CHOL/HDL Ratio: 4
Triglycerides: 74 mg/dL (ref 0.0–149.0)
VLDL: 14.8 mg/dL (ref 0.0–40.0)

## 2022-04-15 LAB — HEMOGLOBIN A1C: Hgb A1c MFr Bld: 6.1 % (ref 4.6–6.5)

## 2022-04-15 LAB — TSH: TSH: 2.16 u[IU]/mL (ref 0.35–5.50)

## 2022-04-15 MED ORDER — ALBUTEROL SULFATE HFA 108 (90 BASE) MCG/ACT IN AERS: 2.0000 | INHALATION_SPRAY | RESPIRATORY_TRACT | 0 refills | Status: DC | PRN

## 2022-04-15 MED ORDER — SERTRALINE HCL 50 MG PO TABS: 50.0000 mg | ORAL_TABLET | Freq: Every day | ORAL | 0 refills | Status: DC

## 2022-04-15 MED ORDER — GABAPENTIN 400 MG PO CAPS: 400.0000 mg | ORAL_CAPSULE | Freq: Three times a day (TID) | ORAL | 0 refills | Status: DC | PRN

## 2022-04-15 MED ORDER — LISINOPRIL 20 MG PO TABS: 20.0000 mg | ORAL_TABLET | Freq: Every day | ORAL | 0 refills | Status: DC

## 2022-04-15 MED ORDER — CHLORTHALIDONE 25 MG PO TABS: 12.5000 mg | ORAL_TABLET | Freq: Every day | ORAL | 0 refills | Status: DC

## 2022-04-15 MED ORDER — QUETIAPINE FUMARATE 300 MG PO TABS: 300.0000 mg | ORAL_TABLET | Freq: Every day | ORAL | 0 refills | Status: DC

## 2022-04-15 NOTE — Progress Notes (Unsigned)
Subjective:    Patient ID: Todd [name], male    DOB: [birth year], 46 y.o.   MRN: [id number]  Chief Complaint  Patient presents with   New Patient (Initial Visit)    NP in office to establish care with PCP; pt has right knee pain and sciatica; pt seeing Ortho today; pt has no concerns to discuss today just needing CPE and not fasting for labs; pt needs order placed for Colonoscopy;     HPI 46 y.o. patient presents today for new patient establishment with me.  Working, 4 daughters.   Current Care Team: -Delbert Harness appt today for R knee injury   -Dorcas Mcmurray - rehab [facility] -Counselor at [facility]  -Requesting psychiatry referral   Acute Concerns: Needing updated labs and CPE  Chronic Concerns: See PMH listed below, as well as A/P for details on issues we specifically discussed during today's visit.      Past Medical History:  Diagnosis Date   Anxiety    Depression    Hepatitis C    Hypertension    PTSD (post-traumatic stress disorder)    Sciatica     Past Surgical History:  Procedure Laterality Date   TESTICLE TORSION REDUCTION      Family History  Problem Relation Age of Onset   Drug abuse Mother    Melanoma Mother    COPD Father    Hypertension Father    Diabetes Mellitus II Father    Heart disease Father    Depression Father    Anxiety disorder Father     Social History   Tobacco Use   Smoking status: Former    Packs/day: 1.00    Types: Cigarettes    Quit date: 03/08/2021    Years since quitting: 1.1   Smokeless tobacco: Never   Tobacco comments:    Pt declines teaching  Vaping Use   Vaping Use: Some days   Substances: Nicotine, Flavoring  Substance Use Topics   Alcohol use: Not Currently    Comment: Occasional wine -bottle in 1-2 days   Drug use: Not Currently    Types: IV, Heroin, Cocaine, Marijuana, Methamphetamines    Comment: on methadone (pt in recovery and clean 3 months)     Allergies  Allergen  Reactions   Penicillins Anaphylaxis and Hives    Review of Systems NEGATIVE UNLESS OTHERWISE INDICATED IN HPI      Objective:     BP 130/88 (BP Location: Left Arm)   Pulse (!) 59   Temp 98.4 F (36.9 C) (Temporal)   Ht 6\' 1"  (1.854 m)   Wt 245 lb 9.6 oz (111.4 kg)   SpO2 98%   BMI 32.40 kg/m   Wt Readings from Last 3 Encounters:  04/15/22 245 lb 9.6 oz (111.4 kg)  03/08/22 240 lb (108.9 kg)  04/25/18 250 lb (113.4 kg)    BP Readings from Last 3 Encounters:  04/15/22 130/88  04/05/22 122/78  03/08/22 (!) 142/103     Physical Exam Vitals and nursing note reviewed.  Constitutional:      General: He is not in acute distress.    Appearance: Normal appearance. He is not toxic-appearing.  HENT:     Head: Normocephalic and atraumatic.     Right Ear: Tympanic membrane, ear canal and external ear normal.     Left Ear: Tympanic membrane, ear canal and external ear normal.     Nose: Nose normal.     Mouth/Throat:  Mouth: Mucous membranes are moist.     Pharynx: Oropharynx is clear.  Eyes:     Extraocular Movements: Extraocular movements intact.     Conjunctiva/sclera: Conjunctivae normal.     Pupils: Pupils are equal, round, and reactive to light.  Cardiovascular:     Rate and Rhythm: Normal rate and regular rhythm.     Pulses: Normal pulses.     Heart sounds: Normal heart sounds.  Pulmonary:     Effort: Pulmonary effort is normal.     Breath sounds: Normal breath sounds.  Abdominal:     General: Abdomen is flat. Bowel sounds are normal.     Palpations: Abdomen is soft.     Tenderness: There is no abdominal tenderness.  Musculoskeletal:        General: Normal range of motion.     Cervical back: Normal range of motion and neck supple.  Skin:    General: Skin is warm and dry.  Neurological:     General: No focal deficit present.     Mental Status: He is alert and oriented to person, place, and time.  Psychiatric:        Mood and Affect: Mood normal.         Behavior: Behavior normal.        Assessment & Plan:  Encounter for annual physical exam Assessment & Plan: Age-appropriate screening and counseling performed today. Will check labs and call with results. Preventive measures discussed and printed in AVS for patient.   Tdap at pharmacy  Referral for colonoscopy  Patient Counseling: [x]   Nutrition: Stressed importance of moderation in sodium/caffeine intake, saturated fat and cholesterol, caloric balance, sufficient intake of fresh fruits, vegetables, and fiber.  [x]   Stressed the importance of regular exercise.   [x]   Substance Abuse: Discussed cessation/primary prevention of tobacco, alcohol, or other drug use; driving or other dangerous activities under the influence; availability of treatment for abuse.   []   Injury prevention: Discussed safety belts, safety helmets, smoke detector, smoking near bedding or upholstery.   [x]   Sexuality: Discussed sexually transmitted diseases, partner selection, use of condoms, avoidance of unintended pregnancy  and contraceptive alternatives.   [x]   Dental health: Discussed importance of regular tooth brushing, flossing, and dental visits.  [x]   Health maintenance and immunizations reviewed. Please refer to Health maintenance section.      Orders: -     CBC with Differential/Platelet -     Comprehensive metabolic panel -     Lipid panel -     TSH -     Hemoglobin A1c  Essential hypertension Assessment & Plan: Normotensive  Cont Chlorthalidone 12.5 mg, lisinopril 20 mg daily Prescriptions refilled Monitor at home   Screening for colon cancer -     Ambulatory referral to Gastroenterology  PTSD (post-traumatic stress disorder) -     Ambulatory referral to Psychiatry  Anxiety and depression -     Ambulatory referral to Psychiatry  Chronic hepatitis (HCC) Assessment & Plan: Check hepatitis C quantitative levels, treat pending results  Orders: -     Hepatitis C antibody, reflex -      HCV Ab w Reflex to Quant PCR  Polysubstance (including opioids) dependence with physiological dependence (HCC) Assessment & Plan: Currently clean, working with a counselor  Orders: -     Ambulatory referral to Psychiatry  Substance induced mood disorder (HCC) Assessment & Plan: Referral to psychiatry Refilled Seroquel 300 mg to take once daily at bedtime and 50 mg daily  Other orders -     Albuterol Sulfate HFA; Inhale 2 puffs into the lungs every 4 (four) hours as needed for wheezing or shortness of breath.  Dispense: 1 each; Refill: 0 -     HCV RT-PCR, Quant (Non-Graph) -     Chlorthalidone; Take 0.5 tablets (12.5 mg total) by mouth daily.  Dispense: 45 tablet; Refill: 0 -     Gabapentin; Take 1 capsule (400 mg total) by mouth 3 (three) times daily as needed.  Dispense: 270 capsule; Refill: 0 -     Lisinopril; Take 1 tablet (20 mg total) by mouth daily.  Dispense: 90 tablet; Refill: 0 -     QUEtiapine Fumarate; Take 1 tablet (300 mg total) by mouth at bedtime.  Dispense: 90 tablet; Refill: 0 -     Sertraline HCl; Take 1 tablet (50 mg total) by mouth daily.  Dispense: 90 tablet; Refill: 0        Return in about 6 months (around 10/15/2022) for recheck .  This note was prepared with assistance of Conservation officer, historic buildings. Occasional wrong-word or sound-a-like substitutions may have occurred due to the inherent limitations of voice recognition software.     Audery Wassenaar M Brendon Christoffel, PA-C

## 2022-04-15 NOTE — Telephone Encounter (Signed)
Called pt and advised 90 day Rx's sent to pharmacy requested. Pt verbalized understanding

## 2022-04-15 NOTE — Telephone Encounter (Signed)
Patient states he had his New Patient appointment today and discussed needing refills on all of his RX's that were previously prescribed by his prior PCP.  Requests the following RX's:  chlorthalidone (HYGROTON) 25 MG tablet  gabapentin (NEURONTIN) 400 MG capsule  lisinopril (ZESTRIL) 20 MG tablet  QUEtiapine (SEROQUEL) 300 MG tablet  sertraline (ZOLOFT) 50 MG tablet      be sent asap to  [id number] - [place],  - [address] Phone: [phone]  Fax: [phone]     Patient states he will be out of the above RX's this weekend.

## 2022-04-15 NOTE — Patient Instructions (Signed)
Welcome to [facility]! It was a pleasure meeting you today.  As discussed, Please schedule a 6 month follow up visit today.  Labs today, treat pending results Refill all medications  Go to pharmacy to update your TDAP vaccine  Referral to GI for colonoscopy Referral to behavioral health for med management  PLEASE NOTE:  If you had any LAB tests please let us know if you have not heard back within a few days. You may see your results on MyChart before we have a chance to review them but we will give you a call once they are reviewed by [place]. If we ordered any REFERRALS today, please let us know if you have not heard from their office within the next two weeks. Let us know through MyChart if you are needing REFILLS, or have your pharmacy send [place] the request. You can also use MyChart to communicate with me or any office staff.  Please try these tips to maintain a healthy lifestyle:  Eat most of your calories during the day when you are active. Eliminate processed foods including packaged sweets (pies, cakes, cookies), reduce intake of potatoes, white bread, white pasta, and white rice. Look for whole grain options, oat flour or almond flour.  Each meal should contain half fruits/vegetables, one quarter protein, and one quarter carbs (no bigger than a computer mouse).  Cut down on sweet beverages. This includes juice, soda, and sweet tea. Also watch fruit intake, though this is a healthier sweet option, it still contains natural sugar! Limit to 3 servings daily.  Drink at least 1 glass of water with each meal and aim for at least 8 glasses (64 ounces) per day.  Exercise at least 150 minutes every week to the best of your ability.    Take Care,  Vamsi Apfel, PA-C

## 2022-04-19 DIAGNOSIS — K739 Chronic hepatitis, unspecified: Secondary | ICD-10-CM | POA: Insufficient documentation

## 2022-04-19 MED ORDER — QUETIAPINE FUMARATE 300 MG PO TABS: 300.0000 mg | ORAL_TABLET | Freq: Every day | ORAL | 0 refills | Status: DC

## 2022-04-19 MED ORDER — CHLORTHALIDONE 25 MG PO TABS: 12.5000 mg | ORAL_TABLET | Freq: Every day | ORAL | 0 refills | Status: DC

## 2022-04-19 MED ORDER — SERTRALINE HCL 50 MG PO TABS: 50.0000 mg | ORAL_TABLET | Freq: Every day | ORAL | 0 refills | Status: DC

## 2022-04-19 MED ORDER — LISINOPRIL 20 MG PO TABS: 20.0000 mg | ORAL_TABLET | Freq: Every day | ORAL | 0 refills | Status: DC

## 2022-04-19 MED ORDER — GABAPENTIN 400 MG PO CAPS: 400.0000 mg | ORAL_CAPSULE | Freq: Three times a day (TID) | ORAL | 0 refills | Status: DC | PRN

## 2022-04-19 NOTE — Assessment & Plan Note (Signed)
Age-appropriate screening and counseling performed today. Will check labs and call with results. Preventive measures discussed and printed in AVS for patient.   Tdap at pharmacy  Referral for colonoscopy  Patient Counseling: [x]   Nutrition: Stressed importance of moderation in sodium/caffeine intake, saturated fat and cholesterol, caloric balance, sufficient intake of fresh fruits, vegetables, and fiber.  [x]   Stressed the importance of regular exercise.   [x]   Substance Abuse: Discussed cessation/primary prevention of tobacco, alcohol, or other drug use; driving or other dangerous activities under the influence; availability of treatment for abuse.   []   Injury prevention: Discussed safety belts, safety helmets, smoke detector, smoking near bedding or upholstery.   [x]   Sexuality: Discussed sexually transmitted diseases, partner selection, use of condoms, avoidance of unintended pregnancy  and contraceptive alternatives.   [x]   Dental health: Discussed importance of regular tooth brushing, flossing, and dental visits.  [x]   Health maintenance and immunizations reviewed. Please refer to Health maintenance section.

## 2022-04-19 NOTE — Assessment & Plan Note (Signed)
Normotensive  Cont Chlorthalidone 12.5 mg, lisinopril 20 mg daily Prescriptions refilled Monitor at home 

## 2022-04-19 NOTE — Assessment & Plan Note (Signed)
Referral to psychiatry Refilled Seroquel 300 mg to take once daily at bedtime and 50 mg daily

## 2022-04-19 NOTE — Assessment & Plan Note (Signed)
Currently clean, working with a Veterinary surgeon

## 2022-04-19 NOTE — Assessment & Plan Note (Signed)
Check hepatitis C quantitative levels, treat pending results

## 2022-04-20 LAB — HCV RT-PCR, QUANT (NON-GRAPH): Hepatitis C Quantitation: NOT DETECTED IU/mL

## 2022-04-20 LAB — HCV AB W REFLEX TO QUANT PCR: HCV Ab: REACTIVE — AB

## 2022-04-26 ENCOUNTER — Encounter: Payer: Self-pay | Admitting: Physician Assistant

## 2022-04-26 ENCOUNTER — Ambulatory Visit (INDEPENDENT_AMBULATORY_CARE_PROVIDER_SITE_OTHER): Payer: 59 | Admitting: Physician Assistant

## 2022-04-26 VITALS — BP 126/86 | HR 95 | Temp 98.2°F | Ht 73.0 in | Wt 253.2 lb

## 2022-04-26 DIAGNOSIS — F32A Depression, unspecified: Secondary | ICD-10-CM

## 2022-04-26 DIAGNOSIS — F17200 Nicotine dependence, unspecified, uncomplicated: Secondary | ICD-10-CM | POA: Diagnosis not present

## 2022-04-26 DIAGNOSIS — F4310 Post-traumatic stress disorder, unspecified: Secondary | ICD-10-CM | POA: Diagnosis not present

## 2022-04-26 DIAGNOSIS — E782 Mixed hyperlipidemia: Secondary | ICD-10-CM | POA: Diagnosis not present

## 2022-04-26 DIAGNOSIS — R7303 Prediabetes: Secondary | ICD-10-CM

## 2022-04-26 DIAGNOSIS — Z23 Encounter for immunization: Secondary | ICD-10-CM

## 2022-04-26 DIAGNOSIS — F1920 Other psychoactive substance dependence, uncomplicated: Secondary | ICD-10-CM

## 2022-04-26 DIAGNOSIS — F419 Anxiety disorder, unspecified: Secondary | ICD-10-CM

## 2022-04-26 DIAGNOSIS — F1994 Other psychoactive substance use, unspecified with psychoactive substance-induced mood disorder: Secondary | ICD-10-CM

## 2022-04-26 MED ORDER — ATORVASTATIN CALCIUM 10 MG PO TABS: 10.0000 mg | ORAL_TABLET | Freq: Every evening | ORAL | 0 refills | Status: DC

## 2022-04-26 MED ORDER — NICOTINE 21 MG/24HR TD PT24: 21.0000 mg | MEDICATED_PATCH | Freq: Every day | TRANSDERMAL | 1 refills | Status: DC

## 2022-04-26 NOTE — Progress Notes (Signed)
Subjective:    Patient ID: Todd [name], male    DOB: [birth year], 46 y.o.   MRN: [id number]  Chief Complaint  Patient presents with   Follow-up    Pt in office to discuss cholesterol and being pre diabetic; pt wanting some information on where he can go for mental health/counseling; and assistance with quitting vaping; asking for Vitadone prescription if possible. Discuss placing referral to do colonoscopy;     HPI Patient is in today for discussion about labs.  Also wants to talk about mental health counseling and referral.  The 10-year ASCVD risk score (Arnett DK, et al., 2019) is: 11.7%   Values used to calculate the score:     Age: 52 years     Sex: Male     Is Non-Hispanic African American: Yes     Diabetic: No     Tobacco smoker: Yes     Systolic Blood Pressure: 126 mmHg     Is BP treated: Yes     HDL Cholesterol: 45.9 mg/dL     Total Cholesterol: 177 mg/dL   Past Medical History:  Diagnosis Date   Anxiety    Depression    Hepatitis C    Hypertension    PTSD (post-traumatic stress disorder)    Sciatica     Past Surgical History:  Procedure Laterality Date   TESTICLE TORSION REDUCTION      Family History  Problem Relation Age of Onset   Drug abuse Mother    Melanoma Mother    COPD Father    Hypertension Father    Diabetes Mellitus II Father    Heart disease Father    Depression Father    Anxiety disorder Father     Social History   Tobacco Use   Smoking status: Former    Packs/day: 1.00    Types: Cigarettes    Quit date: 03/08/2021    Years since quitting: 1.1   Smokeless tobacco: Never   Tobacco comments:    Pt declines teaching  Vaping Use   Vaping Use: Some days   Substances: Nicotine, Flavoring  Substance Use Topics   Alcohol use: Not Currently    Comment: Occasional wine -bottle in 1-2 days   Drug use: Not Currently    Types: IV, Heroin, Cocaine, Marijuana, Methamphetamines    Comment: on methadone (pt in recovery and clean 3  months)     Allergies  Allergen Reactions   Penicillins Anaphylaxis and Hives   Valproic Acid Other (See Comments)    "my head turns involuntarily to the right"  Neck rigidity  Other reaction(s): Other (See Comments)  Neck rigidity   Haloperidol Lactate     Review of Systems NEGATIVE UNLESS OTHERWISE INDICATED IN HPI      Objective:     BP 126/86 (BP Location: Left Arm)   Pulse 95   Temp 98.2 F (36.8 C) (Temporal)   Ht 6\' 1"  (1.854 m)   Wt 253 lb 3.2 oz (114.9 kg)   SpO2 100%   BMI 33.41 kg/m   Wt Readings from Last 3 Encounters:  04/26/22 253 lb 3.2 oz (114.9 kg)  04/15/22 245 lb 9.6 oz (111.4 kg)  03/08/22 240 lb (108.9 kg)    BP Readings from Last 3 Encounters:  04/26/22 126/86  04/15/22 130/88  04/05/22 122/78     Physical Exam Vitals and nursing note reviewed.  Constitutional:      Appearance: Normal appearance.  Cardiovascular:     Rate and  Rhythm: Normal rate and regular rhythm.     Pulses: Normal pulses.     Heart sounds: No murmur heard. Pulmonary:     Effort: Pulmonary effort is normal. No respiratory distress.     Breath sounds: Normal breath sounds. No wheezing.  Skin:    Findings: No rash.  Neurological:     General: No focal deficit present.     Mental Status: He is alert.  Psychiatric:        Mood and Affect: Mood normal.        Assessment & Plan:  Prediabetes Assessment & Plan: Lab Results  Component Value Date   HGBA1C 6.1 04/15/2022   Strongly encouraged lifestyle changes.  Handout provided to help with this.   Vaping nicotine dependence, non-tobacco product Assessment & Plan: He has been successful with the patch before.  He is going to start on the 21 mg once daily patch and titrate down from there as directed.  He will let me know how he is doing.   Mixed hyperlipidemia Assessment & Plan: The 10-year ASCVD risk score (Arnett DK, et al., 2019) is: 11.7%  Plan to start on Lipitor 10 mg every evening.  Possible  side effects discussed.  Encouraged to quit vaping.  Continue lifestyle changes.   Need for prophylactic vaccination with combined diphtheria-tetanus-pertussis (DTP) vaccine -     Tdap vaccine greater than or equal to 7yo IM  PTSD (post-traumatic stress disorder) -     Ambulatory referral to Psychiatry  Anxiety and depression -     Ambulatory referral to Psychiatry  Polysubstance (including opioids) dependence with physiological dependence (HCC) -     Ambulatory referral to Psychiatry  Substance induced mood disorder (HCC) -     Ambulatory referral to Psychiatry  Other orders -     Nicotine; Place 1 patch (21 mg total) onto the skin daily.  Dispense: 28 patch; Refill: 1 -     Atorvastatin Calcium; Take 1 tablet (10 mg total) by mouth every evening. Take to help lower cholesterol.  Dispense: 90 tablet; Refill: 0       Return in about 3 months (around 07/26/2022) for recheck.  This note was prepared with assistance of Conservation officer, historic buildings. Occasional wrong-word or sound-a-like substitutions may have occurred due to the inherent limitations of voice recognition software.  Time Spent: 37 minutes of total time was spent on the date of the encounter performing the following actions: chart review prior to seeing the patient, obtaining history, performing a medically necessary exam, counseling on the treatment plan, placing orders, and documenting in our EHR.       Lauralei Clouse M Kahil Agner, PA-C

## 2022-04-29 ENCOUNTER — Ambulatory Visit: Payer: 59 | Admitting: Physician Assistant

## 2022-04-29 DIAGNOSIS — R7303 Prediabetes: Secondary | ICD-10-CM | POA: Insufficient documentation

## 2022-04-29 DIAGNOSIS — E782 Mixed hyperlipidemia: Secondary | ICD-10-CM | POA: Insufficient documentation

## 2022-04-29 DIAGNOSIS — F17200 Nicotine dependence, unspecified, uncomplicated: Secondary | ICD-10-CM | POA: Insufficient documentation

## 2022-04-29 NOTE — Assessment & Plan Note (Signed)
Lab Results  Component Value Date   HGBA1C 6.1 04/15/2022   Strongly encouraged lifestyle changes.  Handout provided to help with this.

## 2022-04-29 NOTE — Assessment & Plan Note (Signed)
The 10-year ASCVD risk score (Arnett DK, et al., 2019) is: 11.7%  Plan to start on Lipitor 10 mg every evening.  Possible side effects discussed.  Encouraged to quit vaping.  Continue lifestyle changes.

## 2022-04-29 NOTE — Assessment & Plan Note (Signed)
He has been successful with the patch before.  He is going to start on the 21 mg once daily patch and titrate down from there as directed.  He will let me know how he is doing.

## 2022-05-12 ENCOUNTER — Other Ambulatory Visit: Payer: Self-pay | Admitting: Physician Assistant

## 2022-05-19 ENCOUNTER — Ambulatory Visit (HOSPITAL_COMMUNITY)
Admission: EM | Admit: 2022-05-19 | Discharge: 2022-05-19 | Disposition: A | Discharge status: Home / Self Care | Payer: 59 | Attending: Emergency Medicine | Admitting: Emergency Medicine

## 2022-05-19 ENCOUNTER — Encounter (HOSPITAL_COMMUNITY): Payer: Self-pay

## 2022-05-19 ENCOUNTER — Ambulatory Visit (INDEPENDENT_AMBULATORY_CARE_PROVIDER_SITE_OTHER): Payer: 59

## 2022-05-19 DIAGNOSIS — Z1152 Encounter for screening for COVID-19: Secondary | ICD-10-CM | POA: Insufficient documentation

## 2022-05-19 DIAGNOSIS — J069 Acute upper respiratory infection, unspecified: Secondary | ICD-10-CM | POA: Diagnosis not present

## 2022-05-19 DIAGNOSIS — R059 Cough, unspecified: Secondary | ICD-10-CM | POA: Diagnosis not present

## 2022-05-19 LAB — POCT RAPID STREP A, ED / UC: Streptococcus, Group A Screen (Direct): NEGATIVE

## 2022-05-19 MED ORDER — BENZONATATE 100 MG PO CAPS: 100.0000 mg | ORAL_CAPSULE | Freq: Three times a day (TID) | ORAL | 0 refills | Status: DC

## 2022-05-19 NOTE — Discharge Instructions (Addendum)
You may use your albuterol inhaler every 4-6 hours as needed for shortness of breath. Tessalon has been sent to the pharmacy for cough, you can use this medication every 8 hours as needed.   Viral illnesses usually takes 7 to 10 days to resolve.  Using over-the-counter medications can help treat the symptoms.  You may rotate Tylenol and ibuprofen every 4-6 hours.  For example, take Tylenol now and then 4 to 6 hours later take ibuprofen.  You can use Tylenol for fever and moderate pain, you can take this medication every 4-6 hours, please do not take more than 3000 mg in a 24-hour day.  You can take ibuprofen every 6 hours, do not take more than 2400 mg in a 24-hour day.  I advised that you do not take ibuprofen on an empty stomach, ibuprofen can cause GI problems such as GI bleeding.  For nasal congestion, you can buy Flonase nasal spray over-the-counter, this is an intranasal steroid.  You will put 2 sprays in each nostril during application.  I suggest that you would use Flonase upon waking in the morning and in the evening.  Mucinex is an expectorant that can be purchased over-the-counter, this can help clear congestion from your respiratory tract.  For Mucinex dosage, please refer to the back of the box for dosage instructions, this can vary depending on the brand of medication.  Chloraseptic throat spray and lozenges can be used for sore throat.  You may use warm liquids and honey for symptom management.  Maintaining hydration status is very important, please drink at least 8 cups of water daily.  Please try to intake nutrient dense meals.

## 2022-05-19 NOTE — ED Provider Notes (Addendum)
[facility]    CSN: [id number] Arrival date & time: 05/19/22  1213      History   Chief Complaint Chief Complaint  Patient presents with   Cough   Nasal Congestion    HPI Todd [name] is a 47 y.o. male.  Patient presents complaining of productive cough and chest congestion that started 3 days ago.  Patient reports an exposure to a viral illness of unknown etiology.  Patient reports shortness of breath and wheezing at times.  Patient states he has a history of pneumonia and states " I felt like this last time I had pneumonia".  He has taken over-the-counter medications with minimal relief of symptoms.  He has used his albuterol inhaler with some relief of symptoms.    Cough Associated symptoms: chills, rhinorrhea, shortness of breath (Upon exertion), sore throat and wheezing   Associated symptoms: no chest pain, no ear pain and no fever     Past Medical History:  Diagnosis Date   Anxiety    Depression    Hepatitis C    Hypertension    PTSD (post-traumatic stress disorder)    Sciatica     Patient Active Problem List   Diagnosis Date Noted   Prediabetes 04/29/2022   Vaping nicotine dependence, non-tobacco product 04/29/2022   Mixed hyperlipidemia 04/29/2022   Chronic hepatitis ([place]) 04/19/2022   Anxiety and depression 04/15/2022   Encounter for annual physical exam 04/15/2022   MDD (major depressive disorder), recurrent episode, severe ([place]) 04/26/2018   AKI (acute kidney injury) ([place]) 09/01/2016   Rhabdomyolysis 09/01/2016   Essential hypertension 09/01/2016   Left flank pain    Major depressive disorder, 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 Authorizing Provider  benzonatate (TESSALON) 100 MG capsule Take 1 capsule (100 mg total) by mouth every 8 (eight) hours. 05/19/22  Yes Flossie Dibble, NP  albuterol (VENTOLIN HFA) 108 (90 Base) MCG/ACT inhaler INHALE 2 PUFFS INTO THE LUNGS EVERY 4 HOURS AS NEEDED FOR WHEEZING OR SHORTNESS OF BREATH. 05/12/22   Allwardt, Alyssa M, PA-C  atorvastatin (LIPITOR) 10 MG tablet Take 1 tablet (10 mg total) by mouth every evening. Take to help lower cholesterol. 04/26/22   Allwardt, Randa Evens, PA-C  chlorthalidone (HYGROTON) 25 MG tablet Take 0.5 tablets (12.5 mg total) by mouth daily. 04/19/22 07/18/22  Allwardt, Randa Evens, PA-C  gabapentin (NEURONTIN) 400 MG capsule Take 1 capsule (400 mg total) by mouth 3 (three) times daily as needed. 04/19/22 07/18/22  Allwardt, Randa Evens, PA-C  lisinopril (ZESTRIL) 20 MG tablet Take 1 tablet (20 mg total) by mouth daily. 04/19/22 07/18/22  Allwardt, Randa Evens, PA-C  nicotine (NICODERM CQ - DOSED IN MG/24 HOURS) 21 mg/24hr patch Place 1 patch (21 mg total) onto the skin daily. 04/26/22   Allwardt, Randa Evens, PA-C  QUEtiapine (SEROQUEL) 300 MG tablet Take 1 tablet (300 mg total) by mouth at bedtime. 04/19/22 07/18/22  Allwardt, Randa Evens, PA-C  sertraline (ZOLOFT) 50 MG tablet Take 1 tablet (50 mg  total) by mouth daily. 04/19/22 07/18/22  Allwardt, Crist Infante, PA-C    Family History Family History  Problem Relation Age of Onset   Drug abuse Mother    Melanoma Mother    COPD Father    Hypertension Father    Diabetes Mellitus II Father    Heart disease Father    Depression Father    Anxiety disorder Father     Social History Social History   Tobacco Use   Smoking status: Former    Packs/day: 1.00    Types: Cigarettes    Quit date: 03/08/2021    Years since  quitting: 1.1   Smokeless tobacco: Never   Tobacco comments:    Pt declines teaching  Vaping Use   Vaping Use: Some days   Substances: Nicotine, Flavoring  Substance Use Topics   Alcohol use: Not Currently    Comment: Occasional wine -bottle in 1-2 days   Drug use: Not Currently    Types: IV, Heroin, Cocaine, Marijuana, Methamphetamines    Comment: on methadone (pt in recovery and clean 3 months)     Allergies   Penicillins, Valproic acid, and Haloperidol lactate   Review of Systems Review of Systems  Constitutional:  Positive for activity change, appetite change, chills and fatigue. Negative for fever.  HENT:  Positive for congestion, rhinorrhea, sinus pressure and sore throat. Negative for ear discharge, ear pain, postnasal drip, sinus pain and trouble swallowing.   Eyes: Negative.   Respiratory:  Positive for cough, shortness of breath (Upon exertion) and wheezing. Negative for chest tightness.   Cardiovascular:  Negative for chest pain and palpitations.  Gastrointestinal: Negative.      Physical Exam Triage Vital Signs ED Triage Vitals [05/19/22 1518]  Enc Vitals Group     BP 131/74     Pulse Rate 80     Resp 18     Temp 98.7 F (37.1 C)     Temp Source Oral     SpO2 98 %     Weight      Height      Head Circumference      Peak Flow      Pain Score      Pain Loc      Pain Edu?      Excl. in GC?    No data found.  Updated Vital Signs BP 131/74 (BP Location: Left Arm)   Pulse 80   Temp 98.7 F (37.1 C) (Oral)   Resp 18   SpO2 98%     Physical Exam Vitals and nursing note reviewed.  Constitutional:      Appearance: Normal appearance.  HENT:     Right Ear: Hearing, tympanic membrane, ear canal and external ear normal.     Left Ear: Hearing, tympanic membrane, ear canal and external ear normal.     Nose: Rhinorrhea present. Rhinorrhea is clear.     Right Turbinates: Not enlarged, swollen or pale.     Left Turbinates: Not enlarged, swollen or pale.      Right Sinus: No maxillary sinus tenderness or frontal sinus tenderness.     Left Sinus: No maxillary sinus tenderness or frontal sinus tenderness.     Mouth/Throat:     Mouth: Mucous membranes are moist.     Pharynx: Oropharynx is clear. Uvula midline. No pharyngeal swelling, oropharyngeal exudate, posterior oropharyngeal erythema or uvula swelling.     Tonsils: No tonsillar exudate or tonsillar abscesses. 0 on the right. 0 on the  left.  Cardiovascular:     Rate and Rhythm: Normal rate and regular rhythm.     Heart sounds: Normal heart sounds, S1 normal and S2 normal.  Pulmonary:     Effort: Pulmonary effort is normal.     Breath sounds: Examination of the left-lower field reveals decreased breath sounds. Decreased breath sounds present. No wheezing, rhonchi or rales.  Lymphadenopathy:     Head:     Right side of head: No tonsillar adenopathy.     Left side of head: No tonsillar adenopathy.     Cervical: No cervical adenopathy.  Neurological:     Mental Status: He is alert.      UC Treatments / Results  Labs (all labs ordered are listed, but only abnormal results are displayed) Labs Reviewed  SARS CORONAVIRUS 2 (TAT 6-24 HRS)  POCT RAPID STREP A, ED / UC    EKG   Radiology DG Chest 2 View  Result Date: 05/19/2022 CLINICAL DATA:  Cough for 3 days EXAM: CHEST - 2 VIEW COMPARISON:  06/25/2021 FINDINGS: The heart size and mediastinal contours are within normal limits. Both lungs are clear. The visualized skeletal structures are unremarkable. IMPRESSION: No active cardiopulmonary disease. Electronically Signed   By: Donavan Foil M.D.   On: 05/19/2022 16:30    Procedures Procedures (including critical care time)  Medications Ordered in UC Medications - No data to display  Initial Impression / Assessment and Plan / UC Course  I have reviewed the triage vital signs and the nursing notes.  Pertinent labs & imaging results that were available during my care of the patient  were reviewed by me and considered in my medical decision making (see chart for details).     Patient was evaluated for viral illness.  POC strep test negative.COVID test pending.  Chest x-ray performed per patient request which showed no cardiopulmonary disease. Tessalon sent to pharmacy for symptoms management.  Patient was made aware of use of his albuterol inhaler and treatment regiment.Patient made aware of timeline for symptom resolution and when follow-up would be necessary.  Patient made aware of results reporting protocol and MyChart.  Patient verbalized understanding of instructions.    Charting was provided using a a verbal dictation system, charting was proofread for errors, errors may occur which could change the meaning of the information charted.   Final Clinical Impressions(s) / UC Diagnoses   Final diagnoses:  Viral URI with cough     Discharge Instructions      You may use your albuterol inhaler every 4-6 hours as needed for shortness of breath. Tessalon has been sent to the pharmacy for cough, you can use this medication every 8 hours as needed.   Viral illnesses usually takes 7 to 10 days to resolve.  Using over-the-counter medications can help treat the symptoms.  You may rotate Tylenol and ibuprofen every 4-6 hours.  For example, take Tylenol now and then 4 to 6 hours later take ibuprofen.  You can use Tylenol for fever and moderate pain, you can take this medication every 4-6 hours, please do not take more than 3000 mg in a 24-hour day.  You can take ibuprofen every 6 hours, do not take more than 2400 mg in a 24-hour day.  I advised that you do not take ibuprofen on an empty stomach, ibuprofen can cause GI problems such as GI bleeding.  For nasal congestion, you can buy Flonase nasal spray over-the-counter, this is an intranasal steroid.  You will  put 2 sprays in each nostril during application.  I suggest that you would use Flonase upon waking in the morning and in the  evening.  Mucinex is an expectorant that can be purchased over-the-counter, this can help clear congestion from your respiratory tract.  For Mucinex dosage, please refer to the back of the box for dosage instructions, this can vary depending on the brand of medication.  Chloraseptic throat spray and lozenges can be used for sore throat.  You may use warm liquids and honey for symptom management.  Maintaining hydration status is very important, please drink at least 8 cups of water daily.  Please try to intake nutrient dense meals.      ED Prescriptions     Medication Sig Dispense Auth. Provider   benzonatate (TESSALON) 100 MG capsule Take 1 capsule (100 mg total) by mouth every 8 (eight) hours. 24 capsule Debby Freiberg, NP      PDMP not reviewed this encounter.   Debby Freiberg, NP 05/19/22 1732    Debby Freiberg, NP 05/19/22 1733

## 2022-05-19 NOTE — ED Triage Notes (Signed)
3 day h/o chest congestion and coughing. Pt thinks he has covid and would like to be check.  Pt is taking OTC medication with no relief.

## 2022-05-20 LAB — SARS CORONAVIRUS 2 (TAT 6-24 HRS): SARS Coronavirus 2: NEGATIVE

## 2022-06-02 ENCOUNTER — Ambulatory Visit (HOSPITAL_COMMUNITY): Payer: 59 | Admitting: Psychiatry

## 2022-06-10 ENCOUNTER — Other Ambulatory Visit: Payer: Self-pay | Admitting: Physician Assistant

## 2022-06-14 ENCOUNTER — Ambulatory Visit (INDEPENDENT_AMBULATORY_CARE_PROVIDER_SITE_OTHER): Payer: 59 | Admitting: Physician Assistant

## 2022-06-14 ENCOUNTER — Encounter: Payer: Self-pay | Admitting: Physician Assistant

## 2022-06-14 VITALS — BP 106/70 | HR 70 | Temp 98.2°F | Ht 73.0 in | Wt 263.6 lb

## 2022-06-14 DIAGNOSIS — Z23 Encounter for immunization: Secondary | ICD-10-CM | POA: Diagnosis not present

## 2022-06-14 DIAGNOSIS — F17200 Nicotine dependence, unspecified, uncomplicated: Secondary | ICD-10-CM

## 2022-06-14 DIAGNOSIS — Z01818 Encounter for other preprocedural examination: Secondary | ICD-10-CM | POA: Diagnosis not present

## 2022-06-14 DIAGNOSIS — E782 Mixed hyperlipidemia: Secondary | ICD-10-CM | POA: Diagnosis not present

## 2022-06-14 DIAGNOSIS — I10 Essential (primary) hypertension: Secondary | ICD-10-CM

## 2022-06-14 NOTE — Progress Notes (Unsigned)
Subjective:    Patient ID: Todd [name], male    DOB: 07-04-1975, 47 y.o.   MRN: [id number]  Chief Complaint  Patient presents with   Pre-op Exam    Pt in office for pre op exam; pt due for 2 month f/u; pt c/o nerve pain possible sciatica on right side, painful to touch right thigh; pt not wearing nicotine patch every day, states some days he struggles but doing better; pt scared wear patch and vape at the same time; no side effects to Lipitor that he can tell pt taking in the morning, having a hard time cutting back on sweets; Pt referral for Colonoscopy closed due to not being able to contact advised [facility] phone to return call to schedule    HPI Patient is in today for preop evaluation. Scheduled for knee arthroscopy on 06/28/22 with Dr. Mardelle Matte. Hx of anxiety, depression, HTN, Hep C past infection, HLD.   Hx of emergent testicular torsion reduction in his past, no problems with anesthesia.   No cardiac history.   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   Past Medical History:  Diagnosis Date   Anxiety    Depression    Hepatitis C    Hypertension    PTSD (post-traumatic stress disorder)    Sciatica     Past Surgical History:  Procedure Laterality Date   TESTICLE TORSION REDUCTION     age 36    Family History  Problem Relation Age of Onset   Drug abuse Mother    Melanoma Mother    COPD Father    Hypertension Father    Diabetes Mellitus II Father    Heart disease Father    Depression Father    Anxiety disorder Father     Social History   Tobacco Use   Smoking status: Former    Packs/day: 1.00    Types: Cigarettes    Quit date: 03/08/2021    Years since quitting: 1.2   Smokeless tobacco: Never   Tobacco comments:    Pt declines teaching  Vaping Use   Vaping Use: Some days   Substances: Nicotine, Flavoring  Substance Use Topics   Alcohol use: Not  Currently    Comment: Occasional wine -bottle in 1-2 days   Drug use: Not Currently    Types: IV, Heroin, Cocaine, Marijuana, Methamphetamines    Comment: on methadone (pt in recovery and clean 3 months)     Allergies  Allergen Reactions   Penicillins Anaphylaxis and Hives   Valproic Acid Other (See Comments)    "my head turns involuntarily to the right"  Neck rigidity  Other reaction(s): Other (See Comments)  Neck rigidity   Haloperidol Lactate     Review of Systems NEGATIVE UNLESS OTHERWISE INDICATED IN HPI      Objective:     BP 106/70 (BP Location: Left Arm)   Pulse 70   Temp 98.2 F (36.8 C) (Temporal)   Ht 6\' 1"  (1.854 m)   Wt 263 lb 9.6 oz (119.6 kg)   SpO2 97%   BMI 34.78 kg/m   Wt Readings from Last 3 Encounters:  06/14/22 263 lb 9.6 oz (119.6 kg)  04/26/22 253 lb 3.2 oz (114.9 kg)  04/15/22 245 lb 9.6 oz (111.4 kg)    BP Readings from Last 3 Encounters:  06/14/22 106/70  05/19/22 131/74  04/26/22 126/86  Physical Exam Vitals and nursing note reviewed.  Constitutional:      General: He is not in acute distress.    Appearance: Normal appearance. He is not toxic-appearing.  HENT:     Head: Normocephalic and atraumatic.     Right Ear: Tympanic membrane, ear canal and external ear normal.     Left Ear: Tympanic membrane, ear canal and external ear normal.     Nose: Nose normal.     Mouth/Throat:     Mouth: Mucous membranes are moist.     Pharynx: Oropharynx is clear.  Eyes:     Extraocular Movements: Extraocular movements intact.     Conjunctiva/sclera: Conjunctivae normal.     Pupils: Pupils are equal, round, and reactive to light.  Cardiovascular:     Rate and Rhythm: Normal rate and regular rhythm.     Pulses: Normal pulses.     Heart sounds: Normal heart sounds.  Pulmonary:     Effort: Pulmonary effort is normal.     Breath sounds: Normal breath sounds.  Musculoskeletal:        General: Normal range of motion.     Cervical back:  Normal range of motion and neck supple.  Skin:    General: Skin is warm and dry.  Neurological:     General: No focal deficit present.     Mental Status: He is alert and oriented to person, place, and time.  Psychiatric:        Mood and Affect: Mood normal.        Behavior: Behavior normal.        Assessment & Plan:  Preop examination Assessment & Plan: RCRI score of zero, low-risk patient; assessment forms completed and faxed   Essential hypertension Assessment & Plan: Normotensive  Cont Chlorthalidone 12.5 mg, lisinopril 20 mg daily Prescriptions refilled Monitor at home   Mixed hyperlipidemia Assessment & Plan: The 10-year ASCVD risk score (Arnett DK, et al., 2019) is: 8.5%   Values used to calculate the score:     Age: 56 years     Sex: Male     Is Non-Hispanic African American: Yes     Diabetic: No     Tobacco smoker: Yes     Systolic Blood Pressure: 161 mmHg     Is BP treated: Yes     HDL Cholesterol: 45.9 mg/dL     Total Cholesterol: 177 mg/dL  Currently taking Lipitor 10 mg daily and tolerating well. Update labs next visit.   Vaping nicotine dependence, non-tobacco product Assessment & Plan: Still working on cutting back    Need for immunization against influenza -     Flu Vaccine QUAD 8mo+IM (Fluarix, Fluzone & Alfiuria Quad PF)        Return in about 3 months (around 09/13/2022) for fasting labs, med recheck .  This note was prepared with assistance of Systems analyst. Occasional wrong-word or sound-a-like substitutions may have occurred due to the inherent limitations of voice recognition software.     Shevon Sian M Ivaan Liddy, PA-C

## 2022-06-15 DIAGNOSIS — Z01818 Encounter for other preprocedural examination: Secondary | ICD-10-CM | POA: Insufficient documentation

## 2022-06-15 NOTE — Assessment & Plan Note (Signed)
RCRI score of zero, low-risk patient; assessment forms completed and faxed

## 2022-06-15 NOTE — Assessment & Plan Note (Signed)
The 10-year ASCVD risk score (Arnett DK, et al., 2019) is: 8.5%   Values used to calculate the score:     Age: 47 years     Sex: Male     Is Non-Hispanic African American: Yes     Diabetic: No     Tobacco smoker: Yes     Systolic Blood Pressure: 638 mmHg     Is BP treated: Yes     HDL Cholesterol: 45.9 mg/dL     Total Cholesterol: 177 mg/dL  Currently taking Lipitor 10 mg daily and tolerating well. Update labs next visit.

## 2022-06-15 NOTE — Assessment & Plan Note (Signed)
Still working on cutting back

## 2022-06-15 NOTE — Assessment & Plan Note (Signed)
Normotensive  Cont Chlorthalidone 12.5 mg, lisinopril 20 mg daily Prescriptions refilled Monitor at home

## 2022-06-20 ENCOUNTER — Other Ambulatory Visit: Payer: Self-pay

## 2022-06-20 ENCOUNTER — Other Ambulatory Visit: Payer: Self-pay | Admitting: Physician Assistant

## 2022-06-20 ENCOUNTER — Encounter (HOSPITAL_BASED_OUTPATIENT_CLINIC_OR_DEPARTMENT_OTHER): Payer: Self-pay | Admitting: Orthopedic Surgery

## 2022-06-21 ENCOUNTER — Telehealth (HOSPITAL_BASED_OUTPATIENT_CLINIC_OR_DEPARTMENT_OTHER): Payer: 59 | Admitting: Psychiatry

## 2022-06-21 ENCOUNTER — Encounter (HOSPITAL_COMMUNITY): Payer: Self-pay | Admitting: Psychiatry

## 2022-06-21 DIAGNOSIS — F419 Anxiety disorder, unspecified: Secondary | ICD-10-CM

## 2022-06-21 DIAGNOSIS — F1920 Other psychoactive substance dependence, uncomplicated: Secondary | ICD-10-CM | POA: Diagnosis not present

## 2022-06-21 DIAGNOSIS — F4310 Post-traumatic stress disorder, unspecified: Secondary | ICD-10-CM | POA: Diagnosis not present

## 2022-06-21 DIAGNOSIS — F418 Other specified anxiety disorders: Secondary | ICD-10-CM | POA: Diagnosis not present

## 2022-06-21 MED ORDER — SERTRALINE HCL 50 MG PO TABS: 50.0000 mg | ORAL_TABLET | Freq: Every day | ORAL | 0 refills | Status: DC

## 2022-06-21 MED ORDER — QUETIAPINE FUMARATE 300 MG PO TABS: 300.0000 mg | ORAL_TABLET | Freq: Every day | ORAL | 0 refills | Status: DC

## 2022-06-21 MED ORDER — HYDROXYZINE HCL 10 MG PO TABS: 50.0000 mg | ORAL_TABLET | Freq: Three times a day (TID) | ORAL | 2 refills | Status: DC | PRN
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
## 2022-06-24 ENCOUNTER — Other Ambulatory Visit: Payer: Self-pay | Admitting: Physician Assistant

## 2022-06-24 MED ORDER — CHLORTHALIDONE 25 MG PO TABS: 12.5000 mg | ORAL_TABLET | Freq: Every day | ORAL | 0 refills | Status: DC

## 2022-06-24 MED ORDER — LISINOPRIL 20 MG PO TABS: 20.0000 mg | ORAL_TABLET | Freq: Every day | ORAL | 0 refills | Status: DC

## 2022-06-24 MED ORDER — ATORVASTATIN CALCIUM 10 MG PO TABS: 10.0000 mg | ORAL_TABLET | Freq: Every evening | ORAL | 0 refills | Status: DC

## 2022-06-24 MED ORDER — ALBUTEROL SULFATE HFA 108 (90 BASE) MCG/ACT IN AERS: 2.0000 | INHALATION_SPRAY | RESPIRATORY_TRACT | 2 refills | Status: DC | PRN

## 2022-06-24 MED ORDER — GABAPENTIN 400 MG PO CAPS: 400.0000 mg | ORAL_CAPSULE | Freq: Three times a day (TID) | ORAL | 0 refills | Status: DC | PRN

## 2022-06-24 NOTE — Patient Instructions (Signed)
DUE TO COVID-19 ONLY TWO VISITORS  (aged 47 and older)  ARE ALLOWED TO COME WITH YOU AND STAY IN THE WAITING ROOM ONLY DURING PRE OP AND PROCEDURE.   **NO VISITORS ARE ALLOWED IN THE SHORT STAY AREA OR RECOVERY ROOM!!**  IF YOU WILL BE ADMITTED INTO THE HOSPITAL YOU ARE ALLOWED ONLY FOUR SUPPORT PEOPLE DURING VISITATION HOURS ONLY (7 AM -8PM)   The support person(s) must pass our screening, gel in and out, and wear a mask at all times, including in the patient's room. Patients must also wear a mask when staff or their support person are in the room. Visitors GUEST BADGE MUST BE WORN VISIBLY  One adult visitor may remain with you overnight and MUST be in the room by 8 P.M.     Your procedure is scheduled on: 06/28/22   Report to [facility] Main Entrance    Report to admitting at : 10:15 AM   Call this number if you have problems the morning of surgery [phone]   Do not eat food :After Midnight.   After Midnight you may have the following liquids until : 9:30 AM DAY OF SURGERY  Water Black Coffee (sugar ok, NO MILK/CREAM OR CREAMERS)  Tea (sugar ok, NO MILK/CREAM OR CREAMERS) regular and decaf                             Plain Jell-O (NO RED)                                           Fruit ices (not with fruit pulp, NO RED)                                     Popsicles (NO RED)                                                                  Juice: apple, WHITE grape, WHITE cranberry Sports drinks like Gatorade (NO RED)                 The day of surgery:  Drink ONE (1) Pre-Surgery Clear G2 at: 9:30 AM the morning of surgery. Drink in one sitting. Do not sip.  This drink was given to you during your hospital  pre-op appointment visit. Nothing else to drink after completing the Pre-Surgery G2.          If you have questions, please contact your surgeon's office.  Oral Hygiene is also important to reduce your risk of infection.                                     Remember - BRUSH YOUR TEETH THE MORNING OF SURGERY WITH YOUR REGULAR TOOTHPASTE  DENTURES WILL BE REMOVED PRIOR TO SURGERY PLEASE DO NOT APPLY "Poly grip" OR ADHESIVES!!!   Do NOT smoke after Midnight   Take these medicines the morning of surgery with A SIP OF WATER:  Sertraline Hydroxyzine Gabapentin Okay to use inhalers as usual.                       You may not have any metal on your body including  jewelry, and body piercing             Do not wear lotions, powders, cologne, or deodorant              Men may shave face and neck.   Do not bring valuables to the hospital. [place].   Contacts, glasses, or bridgework may not be worn into surgery.  DO NOT [place]. PHARMACY WILL DISPENSE MEDICATIONS LISTED ON YOUR MEDICATION LIST TO YOU DURING YOUR ADMISSION [place]!    Patients discharged on the day of surgery will not be allowed to drive home.  Someone NEEDS to stay with you for the first 24 hours after anesthesia.   Special Instructions: Bring a copy of your healthcare power of attorney and living will documents         the day of surgery if you haven't scanned them before.              Please read over the following fact sheets you were given: IF YOU HAVE QUESTIONS ABOUT YOUR PRE-OP INSTRUCTIONS PLEASE CALL [phone]    [facility] - Preparing for Surgery Before surgery, you can play an important role.  Because skin is not sterile, your skin needs to be as free of germs as possible.  You can reduce the number of germs on your skin by washing with CHG (chlorahexidine gluconate) soap before surgery.  CHG is an antiseptic cleaner which kills germs and bonds with the skin to continue killing germs even after washing. Please DO NOT use if you have an allergy to CHG or antibacterial soaps.  If your skin becomes reddened/irritated stop using the CHG and inform your nurse when you arrive at Short  Stay. Do not shave (including legs and underarms) for at least 48 hours prior to the first CHG shower.  You may shave your face/neck. Please follow these instructions carefully:  1.  Shower with CHG Soap the night before surgery and the  morning of Surgery.  2.  If you choose to wash your hair, wash your hair first as usual with your  normal  shampoo.  3.  After you shampoo, rinse your hair and body thoroughly to remove the  shampoo.                           4.  Use CHG as you would any other liquid soap.  You can apply chg directly  to the skin and wash                       Gently with a scrungie or clean washcloth.  5.  Apply the CHG Soap to your body ONLY FROM THE NECK DOWN.   Do not use on face/ open                           Wound or open sores. Avoid contact with eyes, ears mouth and genitals (private parts).  Wash face,  Genitals (private parts) with your normal soap.             6.  Wash thoroughly, paying special attention to the area where your surgery  will be performed.  7.  Thoroughly rinse your body with warm water from the neck down.  8.  DO NOT shower/wash with your normal soap after using and rinsing off  the CHG Soap.                9.  Pat yourself dry with a clean towel.            10.  Wear clean pajamas.            11.  Place clean sheets on your bed the night of your first shower and do not  sleep with pets. Day of Surgery : Do not apply any lotions/deodorants the morning of surgery.  Please wear clean clothes to the hospital/surgery center.  FAILURE TO FOLLOW THESE INSTRUCTIONS MAY RESULT IN THE CANCELLATION OF YOUR SURGERY PATIENT SIGNATURE_________________________________  NURSE SIGNATURE__________________________________  ________________________________________________________________________  Adam Phenix  An incentive spirometer is a tool that can help keep your lungs clear and active. This tool measures how well you are filling  your lungs with each breath. Taking long deep breaths may help reverse or decrease the chance of developing breathing (pulmonary) problems (especially infection) following: A long period of time when you are unable to move or be active. BEFORE THE PROCEDURE  If the spirometer includes an indicator to show your best effort, your nurse or respiratory therapist will set it to a desired goal. If possible, sit up straight or lean slightly forward. Try not to slouch. Hold the incentive spirometer in an upright position. INSTRUCTIONS FOR USE  Sit on the edge of your bed if possible, or sit up as far as you can in bed or on a chair. Hold the incentive spirometer in an upright position. Breathe out normally. Place the mouthpiece in your mouth and seal your lips tightly around it. Breathe in slowly and as deeply as possible, raising the piston or the ball toward the top of the column. Hold your breath for 3-5 seconds or for as long as possible. Allow the piston or ball to fall to the bottom of the column. Remove the mouthpiece from your mouth and breathe out normally. Rest for a few seconds and repeat Steps 1 through 7 at least 10 times every 1-2 hours when you are awake. Take your time and take a few normal breaths between deep breaths. The spirometer may include an indicator to show your best effort. Use the indicator as a goal to work toward during each repetition. After each set of 10 deep breaths, practice coughing to be sure your lungs are clear. If you have an incision (the cut made at the time of surgery), support your incision when coughing by placing a pillow or rolled up towels firmly against it. Once you are able to get out of bed, walk around indoors and cough well. You may stop using the incentive spirometer when instructed by your caregiver.  RISKS AND COMPLICATIONS Take your time so you do not get dizzy or light-headed. If you are in pain, you may need to take or ask for pain medication  before doing incentive spirometry. It is harder to take a deep breath if you are having pain. AFTER USE Rest and breathe slowly and easily. It can be helpful to keep track of  a log of your progress. Your caregiver can provide you with a simple table to help with this. If you are using the spirometer at home, follow these instructions: [place] IF:  You are having difficultly using the spirometer. You have trouble using the spirometer as often as instructed. Your pain medication is not giving enough relief while using the spirometer. You develop fever of 100.5 F (38.1 C) or higher. SEEK IMMEDIATE MEDICAL CARE IF:  You cough up bloody sputum that had not been present before. You develop fever of 102 F (38.9 C) or greater. You develop worsening pain at or near the incision site. MAKE SURE YOU:  Understand these instructions. Will watch your condition. Will get help right away if you are not doing well or get worse. Document Released: 09/12/2006 Document Revised: 07/25/2011 Document Reviewed: 11/13/2006 [facility] Patient Information 2014 [place], [place].   ________________________________________________________________________

## 2022-06-27 ENCOUNTER — Other Ambulatory Visit: Payer: Self-pay

## 2022-06-27 ENCOUNTER — Encounter (HOSPITAL_COMMUNITY)
Admission: RE | Admit: 2022-06-27 | Discharge: 2022-06-27 | Disposition: A | Discharge status: Home / Self Care | Payer: 59 | Source: Ambulatory Visit | Attending: Orthopedic Surgery | Admitting: Orthopedic Surgery

## 2022-06-27 ENCOUNTER — Encounter (HOSPITAL_COMMUNITY): Payer: Self-pay

## 2022-06-27 ENCOUNTER — Ambulatory Visit (AMBULATORY_SURGERY_CENTER): Payer: 59 | Admitting: *Deleted

## 2022-06-27 VITALS — Ht 73.0 in | Wt 262.0 lb

## 2022-06-27 DIAGNOSIS — I10 Essential (primary) hypertension: Secondary | ICD-10-CM

## 2022-06-27 DIAGNOSIS — R7303 Prediabetes: Secondary | ICD-10-CM

## 2022-06-27 DIAGNOSIS — Z1211 Encounter for screening for malignant neoplasm of colon: Secondary | ICD-10-CM

## 2022-06-27 HISTORY — DX: Pneumonia, unspecified organism: J18.9

## 2022-06-27 HISTORY — DX: Gastro-esophageal reflux disease without esophagitis: K21.9

## 2022-06-27 MED ORDER — NA SULFATE-K SULFATE-MG SULF 17.5-3.13-1.6 GM/177ML PO SOLN: 1.0000 | Freq: Once | ORAL | 0 refills | Status: AC

## 2022-06-27 NOTE — Progress Notes (Addendum)
COVID Vaccine Completed:  Yes  Date of COVID positive in last 90 days:  No  PCP - Alyssa Allwardt, PA-C Cardiologist - N/A  Chest x-ray - 05-19-22 Epic EKG - day of surgery  Stress Test -  N/A ECHO -  N/A Cardiac Cath -  N/A Pacemaker/ICD device last checked: Spinal Cord Stimulator: N/A  Bowel Prep -  N/A  Sleep Study -  N/A CPAP -   Prediabetes Fasting Blood Sugar -  Checks Blood Sugar - does not check   Last dose of GLP1 agonist-  N/A GLP1 instructions:  N/A   Last dose of SGLT-2 inhibitors-  N/A SGLT-2 instructions: N/A  Blood Thinner Instructions: Aspirin Instructions: ASA 81 Last Dose: stopped two week ago  Activity level:  Can go up a flight of stairs and perform activities of daily living without stopping and without symptoms of chest pain or shortness of breath.   Anesthesia review:  Hep C  Patient denies shortness of breath, fever, cough and chest pain at PAT appointment (completed over the phone)  Patient verbalized understanding of instructions that were given to them at the PAT appointment. Patient was also instructed that they will need to review over the PAT instructions again at home before surgery.

## 2022-06-27 NOTE — Progress Notes (Signed)
No egg or soy allergy known to patient  No issues known to pt with past sedation with any surgeries or procedures Patient denies ever being told they had issues or difficulty with intubation  No FH of Malignant Hyperthermia Pt is not on diet pills Pt is not on  home 02  Pt is not on blood thinners  Pt denies issues with constipation  No A fib or A flutter Have any cardiac testing pending--NO Pt instructed to use Singlecare.com or GoodRx for a price reduction on prep    Patient's chart reviewed by Osvaldo Angst CNRA prior to previsit and patient appropriate for the [place].  Previsit completed and red dot placed by patient's name on their procedure day (on provider's schedule).

## 2022-06-27 NOTE — H&P (Signed)
PREOPERATIVE H&P  Chief Complaint: right knee pain  HPI: Todd [name] is a 47 y.o. male who presents with right knee pain.  It has been going on for the last couple of months.  He felt his knee pop when getting into a car.  He has had swelling and pain ever since.  It is rated 4/10.  It is a throbbing pain diffusely around the right knee. MRI has shown a right knee medial meniscus tear. We have done injections and anti-inflammatories.  No real improvement. This is significantly impairing activities of daily living.  He has elected for surgical management.   Past Medical History:  Diagnosis Date   Anxiety    Depression    GERD (gastroesophageal reflux disease)    Hepatitis C    2014   Hypertension    Pneumonia    Pre-diabetes    PTSD (post-traumatic stress disorder)    Sciatica    Past Surgical History:  Procedure Laterality Date   MOUTH SURGERY     TESTICLE TORSION REDUCTION     age 21   Social History   Socioeconomic History   Marital status: Single    Spouse name: Not on file   Number of children: Not on file   Years of education: Not on file   Highest education level: Not on file  Occupational History   Not on file  Tobacco Use   Smoking status: Former    Packs/day: 1.00    Types: Cigarettes    Quit date: 03/08/2021    Years since quitting: 1.3   Smokeless tobacco: Never   Tobacco comments:    Pt declines teaching  Vaping Use   Vaping Use: Every day   Substances: Nicotine, Flavoring  Substance and Sexual Activity   Alcohol use: Not Currently   Drug use: Not Currently    Types: IV, Heroin, Cocaine, Marijuana, Methamphetamines    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)   Sexual activity: Not Currently  Other Topics Concern   Not on file  Social History Narrative   Not on file   Social Determinants of Health   Financial Resource Strain: Not on file  Food Insecurity: Not on file  Transportation Needs: Not on file  Physical Activity:  Not on file  Stress: Not on file  Social Connections: Not on file   Family History  Problem Relation Age of Onset   Drug abuse Mother    Melanoma Mother    COPD Father    Hypertension Father    Diabetes Mellitus II Father    Heart disease Father    Depression Father    Anxiety disorder Father    Allergies  Allergen Reactions   Penicillins Anaphylaxis and Hives   Valproic Acid Other (See Comments)    "my head turns involuntarily to the right"  Neck rigidity    Haloperidol Lactate     tardive dyskinesia   Prior to Admission medications   Medication Sig Start Date End Date Taking? Authorizing Provider  acetaminophen (TYLENOL) 325 MG tablet Take 650 mg by mouth every 6 (six) hours as needed for moderate pain.   Yes [provider]  albuterol (VENTOLIN HFA) 108 (90 Base) MCG/ACT inhaler Inhale 2 puffs into the lungs every 4 (four) hours as needed for wheezing or shortness of breath. 06/24/22  Yes Allwardt, Alyssa M, PA-C  atorvastatin (LIPITOR) 10 MG tablet Take 1 tablet (10 mg total) by mouth every evening. Take to help lower  cholesterol. 06/24/22  Yes Allwardt, Alyssa M, PA-C  chlorthalidone (HYGROTON) 25 MG tablet Take 0.5 tablets (12.5 mg total) by mouth daily. 06/24/22 09/22/22 Yes Allwardt, Alyssa M, PA-C  gabapentin (NEURONTIN) 400 MG capsule Take 1 capsule (400 mg total) by mouth 3 (three) times daily as needed. Patient taking differently: Take 400 mg by mouth 3 (three) times daily. 06/24/22 09/22/22 Yes Allwardt, Alyssa M, PA-C  ibuprofen (ADVIL) 200 MG tablet Take 600-800 mg by mouth every 6 (six) hours as needed for moderate pain.   Yes [provider]  lisinopril (ZESTRIL) 20 MG tablet Take 1 tablet (20 mg total) by mouth daily. 06/24/22 09/22/22 Yes Allwardt, Alyssa M, PA-C  methadone (DOLOPHINE) 10 MG/ML solution Take 140 mg by mouth daily.   Yes [provider]  nicotine (NICODERM CQ - DOSED IN MG/24 HOURS) 21 mg/24hr patch Place 1 patch (21 mg total) onto  the skin daily. 04/26/22  Yes Allwardt, Alyssa M, PA-C  QUEtiapine (SEROQUEL) 300 MG tablet Take 1 tablet (300 mg total) by mouth at bedtime. 06/21/22 09/19/22 Yes Vista Mink, MD  sertraline (ZOLOFT) 50 MG tablet Take 1 tablet (50 mg total) by mouth daily. 06/21/22 09/19/22 Yes Vista Mink, MD  aspirin EC 81 MG tablet Take 81 mg by mouth daily. Swallow whole.    [provider]  B Complex-C (SUPER B COMPLEX PO) Take 1 tablet by mouth daily.    [provider]  hydrOXYzine (ATARAX) 10 MG tablet Take 5 tablets (50 mg total) by mouth 3 (three) times daily as needed. 06/21/22   Vista Mink, MD  meloxicam (MOBIC) 15 MG tablet Take 15 mg by mouth daily.    [provider]  OVER THE COUNTER MEDICATION Take 1 tablet by mouth daily. Vitadone multi-vitamin    [provider]     Positive ROS: All other systems have been reviewed and were otherwise negative with the exception of those mentioned in the HPI and as above.  Physical Exam: General: Alert, no acute distress Cardiovascular: No pedal edema Respiratory: No cyanosis, no use of accessory musculature GI: No organomegaly, abdomen is soft and non-tender Skin: No lesions in the area of chief complaint Neurologic: Sensation intact distally Psychiatric: Patient is competent for consent with normal mood and affect Lymphatic: No axillary or cervical lymphadenopathy  MUSCULOSKELETAL: He has 0 to 125 degrees of motion with minimal crepitus.  Positive medial joint line tenderness.    MRI demonstrates medial meniscus tear.  Some mild thinning medially.    Assessment: Right knee medial meniscus tear   Plan: Plan for Procedure(s): KNEE ARTHROSCOPY WITH MEDIAL MENISECTOMY  The risks benefits and alternatives were discussed with the patient including but not limited to the risks of nonoperative treatment, versus surgical intervention including infection, bleeding, nerve injury,  blood clots, cardiopulmonary  complications, morbidity, mortality, among others, and they were willing to proceed.    Ventura Bruns, PA-C    06/27/2022 12:33 PM

## 2022-06-28 ENCOUNTER — Ambulatory Visit (HOSPITAL_COMMUNITY)
Admission: RE | Admit: 2022-06-28 | Discharge: 2022-06-28 | Disposition: A | Discharge status: Home / Self Care | Payer: 59 | Attending: Orthopedic Surgery | Admitting: Orthopedic Surgery

## 2022-06-28 ENCOUNTER — Other Ambulatory Visit: Payer: Self-pay

## 2022-06-28 ENCOUNTER — Ambulatory Visit (HOSPITAL_BASED_OUTPATIENT_CLINIC_OR_DEPARTMENT_OTHER): Payer: 59 | Admitting: Registered Nurse

## 2022-06-28 ENCOUNTER — Encounter (HOSPITAL_COMMUNITY): Payer: Self-pay | Admitting: Orthopedic Surgery

## 2022-06-28 ENCOUNTER — Ambulatory Visit (HOSPITAL_COMMUNITY): Payer: 59 | Admitting: Registered Nurse

## 2022-06-28 ENCOUNTER — Encounter (HOSPITAL_COMMUNITY)
Admission: RE | Disposition: A | Discharge status: Home / Self Care | Payer: Self-pay | Source: Home / Self Care | Attending: Orthopedic Surgery

## 2022-06-28 DIAGNOSIS — I10 Essential (primary) hypertension: Secondary | ICD-10-CM | POA: Diagnosis not present

## 2022-06-28 DIAGNOSIS — K759 Inflammatory liver disease, unspecified: Secondary | ICD-10-CM

## 2022-06-28 DIAGNOSIS — Z87891 Personal history of nicotine dependence: Secondary | ICD-10-CM

## 2022-06-28 DIAGNOSIS — F418 Other specified anxiety disorders: Secondary | ICD-10-CM | POA: Diagnosis not present

## 2022-06-28 DIAGNOSIS — F17290 Nicotine dependence, other tobacco product, uncomplicated: Secondary | ICD-10-CM | POA: Diagnosis not present

## 2022-06-28 DIAGNOSIS — R7303 Prediabetes: Secondary | ICD-10-CM

## 2022-06-28 DIAGNOSIS — Z79891 Long term (current) use of opiate analgesic: Secondary | ICD-10-CM | POA: Diagnosis not present

## 2022-06-28 DIAGNOSIS — G8929 Other chronic pain: Secondary | ICD-10-CM | POA: Diagnosis not present

## 2022-06-28 DIAGNOSIS — X58XXXA Exposure to other specified factors, initial encounter: Secondary | ICD-10-CM | POA: Diagnosis not present

## 2022-06-28 DIAGNOSIS — S83241A Other tear of medial meniscus, current injury, right knee, initial encounter: Secondary | ICD-10-CM

## 2022-06-28 HISTORY — DX: Prediabetes: R73.03

## 2022-06-28 HISTORY — PX: KNEE ARTHROSCOPY WITH MEDIAL MENISECTOMY: SHX5651

## 2022-06-28 LAB — HEMOGLOBIN A1C
Hgb A1c MFr Bld: 5.3 % (ref 4.8–5.6)
Mean Plasma Glucose: 105.41 mg/dL

## 2022-06-28 LAB — COMPREHENSIVE METABOLIC PANEL
ALT: 19 U/L (ref 0–44)
AST: 23 U/L (ref 15–41)
Albumin: 4 g/dL (ref 3.5–5.0)
Alkaline Phosphatase: 69 U/L (ref 38–126)
Anion gap: 9 (ref 5–15)
BUN: 16 mg/dL (ref 6–20)
CO2: 24 mmol/L (ref 22–32)
Calcium: 8.8 mg/dL — ABNORMAL LOW (ref 8.9–10.3)
Chloride: 105 mmol/L (ref 98–111)
Creatinine, Ser: 1.03 mg/dL (ref 0.61–1.24)
GFR, Estimated: >60 mL/min (ref >60)
Glucose, Bld: 99 mg/dL (ref 70–99)
Potassium: 3.7 mmol/L (ref 3.5–5.1)
Sodium: 138 mmol/L (ref 135–145)
Total Bilirubin: 0.6 mg/dL (ref 0.3–1.2)
Total Protein: 7.8 g/dL (ref 6.5–8.1)

## 2022-06-28 SURGERY — ARTHROSCOPY, KNEE, WITH MEDIAL MENISCECTOMY
Anesthesia: General | Site: Knee | Laterality: Right

## 2022-06-28 MED ORDER — MIDAZOLAM HCL 2 MG/2ML IJ SOLN
2.0000 mg | INTRAMUSCULAR | Status: DC
  Administered 2022-06-28: 2 mg via INTRAVENOUS
  Filled 2022-06-28: qty 2

## 2022-06-28 MED ORDER — ONDANSETRON HCL 4 MG/2ML IJ SOLN: 4.0000 mg | Freq: Once | INTRAMUSCULAR | Status: DC | PRN

## 2022-06-28 MED ORDER — LIDOCAINE 2% (20 MG/ML) 5 ML SYRINGE
INTRAMUSCULAR | Status: DC | PRN
  Administered 2022-06-28: 100 mg via INTRAVENOUS

## 2022-06-28 MED ORDER — ACETAMINOPHEN 500 MG PO TABS
1000.0000 mg | ORAL_TABLET | Freq: Once | ORAL | Status: AC
  Administered 2022-06-28: 1000 mg via ORAL
  Filled 2022-06-28: qty 2

## 2022-06-28 MED ORDER — OXYCODONE HCL 5 MG/5ML PO SOLN: 5.0000 mg | Freq: Once | ORAL | Status: DC | PRN

## 2022-06-28 MED ORDER — FENTANYL CITRATE (PF) 100 MCG/2ML IJ SOLN
INTRAMUSCULAR | Status: AC
  Filled 2022-06-28: qty 2

## 2022-06-28 MED ORDER — DEXAMETHASONE SODIUM PHOSPHATE 10 MG/ML IJ SOLN
INTRAMUSCULAR | Status: AC
  Filled 2022-06-28: qty 1

## 2022-06-28 MED ORDER — EPHEDRINE SULFATE-NACL 50-0.9 MG/10ML-% IV SOSY
PREFILLED_SYRINGE | INTRAVENOUS | Status: DC | PRN
  Administered 2022-06-28: 5 mg via INTRAVENOUS

## 2022-06-28 MED ORDER — OXYCODONE HCL 5 MG PO TABS: 5.0000 mg | ORAL_TABLET | Freq: Once | ORAL | Status: DC | PRN

## 2022-06-28 MED ORDER — ONDANSETRON HCL 4 MG/2ML IJ SOLN
INTRAMUSCULAR | Status: DC | PRN
  Administered 2022-06-28: 44 mg via INTRAVENOUS

## 2022-06-28 MED ORDER — MIDAZOLAM HCL 5 MG/5ML IJ SOLN
INTRAMUSCULAR | Status: DC | PRN
  Administered 2022-06-28: 2 mg via INTRAVENOUS

## 2022-06-28 MED ORDER — ONDANSETRON HCL 4 MG/2ML IJ SOLN
INTRAMUSCULAR | Status: AC
  Filled 2022-06-28: qty 2

## 2022-06-28 MED ORDER — PROPOFOL 10 MG/ML IV BOLUS
INTRAVENOUS | Status: AC
  Filled 2022-06-28: qty 20

## 2022-06-28 MED ORDER — BUPIVACAINE-EPINEPHRINE 0.5% -1:200000 IJ SOLN
INTRAMUSCULAR | Status: DC | PRN
  Administered 2022-06-28: 20 mL

## 2022-06-28 MED ORDER — DEXAMETHASONE SODIUM PHOSPHATE 10 MG/ML IJ SOLN
INTRAMUSCULAR | Status: DC | PRN
  Administered 2022-06-28: 8 mg via INTRAVENOUS

## 2022-06-28 MED ORDER — LIDOCAINE HCL (PF) 2 % IJ SOLN
INTRAMUSCULAR | Status: AC
  Filled 2022-06-28: qty 5

## 2022-06-28 MED ORDER — EPHEDRINE 5 MG/ML INJ
INTRAVENOUS | Status: AC
  Filled 2022-06-28: qty 5

## 2022-06-28 MED ORDER — FENTANYL CITRATE PF 50 MCG/ML IJ SOSY
100.0000 ug | PREFILLED_SYRINGE | INTRAMUSCULAR | Status: DC
  Administered 2022-06-28: 100 ug via INTRAVENOUS
  Filled 2022-06-28: qty 2

## 2022-06-28 MED ORDER — FENTANYL CITRATE (PF) 100 MCG/2ML IJ SOLN
INTRAMUSCULAR | Status: DC | PRN
  Administered 2022-06-28: 100 ug via INTRAVENOUS

## 2022-06-28 MED ORDER — ONDANSETRON 4 MG PO TBDP: 4.0000 mg | ORAL_TABLET | Freq: Three times a day (TID) | ORAL | 0 refills | Status: DC | PRN

## 2022-06-28 MED ORDER — HYDROCODONE-ACETAMINOPHEN 5-325 MG PO TABS: 1.0000 | ORAL_TABLET | Freq: Four times a day (QID) | ORAL | 0 refills | Status: DC | PRN

## 2022-06-28 MED ORDER — BUPIVACAINE HCL (PF) 0.5 % IJ SOLN
INTRAMUSCULAR | Status: AC
  Filled 2022-06-28: qty 30

## 2022-06-28 MED ORDER — KETOROLAC TROMETHAMINE 30 MG/ML IJ SOLN
INTRAMUSCULAR | Status: AC
  Filled 2022-06-28: qty 1

## 2022-06-28 MED ORDER — VANCOMYCIN HCL 1500 MG/300ML IV SOLN
1500.0000 mg | INTRAVENOUS | Status: AC
  Administered 2022-06-28: 1500 mg via INTRAVENOUS
  Filled 2022-06-28: qty 300

## 2022-06-28 MED ORDER — EPINEPHRINE PF 1 MG/ML IJ SOLN
INTRAMUSCULAR | Status: AC
  Filled 2022-06-28: qty 1

## 2022-06-28 MED ORDER — ACETAMINOPHEN 10 MG/ML IV SOLN: 1000.0000 mg | Freq: Once | INTRAVENOUS | Status: DC | PRN

## 2022-06-28 MED ORDER — FENTANYL CITRATE PF 50 MCG/ML IJ SOSY: 25.0000 ug | PREFILLED_SYRINGE | INTRAMUSCULAR | Status: DC | PRN

## 2022-06-28 MED ORDER — MIDAZOLAM HCL 2 MG/2ML IJ SOLN
INTRAMUSCULAR | Status: AC
  Filled 2022-06-28: qty 2

## 2022-06-28 MED ORDER — SODIUM CHLORIDE 0.9 % IR SOLN
Status: DC | PRN
  Administered 2022-06-28: 3000 mL

## 2022-06-28 MED ORDER — LACTATED RINGERS IV SOLN: INTRAVENOUS | Status: DC

## 2022-06-28 MED ORDER — ROPIVACAINE HCL 7.5 MG/ML IJ SOLN
INTRAMUSCULAR | Status: DC | PRN
  Administered 2022-06-28: 20 mL via PERINEURAL

## 2022-06-28 MED ORDER — KETOROLAC TROMETHAMINE 30 MG/ML IJ SOLN
INTRAMUSCULAR | Status: DC | PRN
  Administered 2022-06-28: 30 mg via INTRAVENOUS

## 2022-06-28 MED ORDER — PROPOFOL 10 MG/ML IV BOLUS
INTRAVENOUS | Status: DC | PRN
  Administered 2022-06-28: 200 mg via INTRAVENOUS

## 2022-06-28 SURGICAL SUPPLY — 40 items
BANDAGE ESMARK 6X9 LF (GAUZE/BANDAGES/DRESSINGS) IMPLANT
BNDG ELASTIC 6X10 VLCR STRL LF (GAUZE/BANDAGES/DRESSINGS) IMPLANT
BNDG ELASTIC 6X5.8 VLCR STR LF (GAUZE/BANDAGES/DRESSINGS) ×1 IMPLANT
BNDG ESMARK 6X9 LF (GAUZE/BANDAGES/DRESSINGS)
CLSR STERI-STRIP ANTIMIC 1/2X4 (GAUZE/BANDAGES/DRESSINGS) ×1 IMPLANT
CUFF TOURN SGL QUICK 34 (TOURNIQUET CUFF)
CUFF TRNQT CYL 34X4.125X (TOURNIQUET CUFF) IMPLANT
CUTTER TENSIONER SUT 2-0 0 FBW (INSTRUMENTS) IMPLANT
DISSECTOR  3.8MM X 13CM (MISCELLANEOUS) ×1
DISSECTOR 3.8MM X 13CM (MISCELLANEOUS) ×1 IMPLANT
DISSECTOR 4.0MM X 13CM (MISCELLANEOUS) IMPLANT
DRAPE ARTHROSCOPY W/POUCH 114 (DRAPES) ×1 IMPLANT
DRAPE IMP U-DRAPE 54X76 (DRAPES) ×1 IMPLANT
DRAPE U-SHAPE 47X51 STRL (DRAPES) ×1 IMPLANT
DURAPREP 26ML APPLICATOR (WOUND CARE) ×1 IMPLANT
ELECT MENISCUS 165MM 90D (ELECTRODE) IMPLANT
ELECT REM PT RETURN 15FT ADLT (MISCELLANEOUS) IMPLANT
GAUZE PAD ABD 8X10 STRL (GAUZE/BANDAGES/DRESSINGS) IMPLANT
GAUZE SPONGE 4X4 12PLY STRL (GAUZE/BANDAGES/DRESSINGS) ×1 IMPLANT
GLOVE BIO SURGEON STRL SZ7 (GLOVE) ×1 IMPLANT
GLOVE BIOGEL PI IND STRL 7.0 (GLOVE) ×1 IMPLANT
GLOVE BIOGEL PI IND STRL 8 (GLOVE) ×2 IMPLANT
GLOVE ORTHO TXT STRL SZ7.5 (GLOVE) ×1 IMPLANT
GOWN STRL REUS W/ TWL LRG LVL3 (GOWN DISPOSABLE) ×2 IMPLANT
GOWN STRL REUS W/TWL LRG LVL3 (GOWN DISPOSABLE) ×2
IV NS IRRIG 3000ML ARTHROMATIC (IV SOLUTION) ×2 IMPLANT
KIT BASIN OR (CUSTOM PROCEDURE TRAY) ×1 IMPLANT
MANIFOLD NEPTUNE II (INSTRUMENTS) ×1 IMPLANT
PACK ARTHROSCOPY DSU (CUSTOM PROCEDURE TRAY) ×1 IMPLANT
PAD CAST 4YDX4 CTTN HI CHSV (CAST SUPPLIES) IMPLANT
PADDING CAST COTTON 4X4 STRL (CAST SUPPLIES) ×1
PADDING CAST COTTON 6X4 STRL (CAST SUPPLIES) IMPLANT
PENCIL SMOKE EVACUATOR (MISCELLANEOUS) IMPLANT
PORT APPOLLO RF 90DEGREE MULTI (SURGICAL WAND) IMPLANT
SLEEVE SCD COMPRESS KNEE MED (STOCKING) IMPLANT
SUT MNCRL AB 4-0 PS2 18 (SUTURE) ×1 IMPLANT
TOWEL GREEN STERILE FF (TOWEL DISPOSABLE) ×1 IMPLANT
TUBING ARTHROSCOPY IRRIG 16FT (MISCELLANEOUS) ×1 IMPLANT
WATER STERILE IRR 1000ML POUR (IV SOLUTION) ×1 IMPLANT
WRAP KNEE MAXI GEL POST OP (GAUZE/BANDAGES/DRESSINGS) ×1 IMPLANT

## 2022-06-28 NOTE — Discharge Instructions (Signed)
Diet: As you were doing prior to hospitalization   Shower:  May shower but keep the wounds dry, use an occlusive plastic wrap, NO SOAKING IN TUB.  If the bandage gets wet, change with a clean dry gauze.    Dressing:  You may change your dressing 3-5 days after surgery. There are sticky tapes (steri-strips) on your wounds and all the stitches are absorbable.  Leave the steri-strips in place when changing your dressings, they will peel off with time, usually 2-3 weeks.  Activity:  Increase activity slowly as tolerated, but follow the weight bearing instructions below.  The rules on driving is that you can not be taking narcotics while you drive, and you must feel in control of the vehicle.    Weight Bearing:   weight bearing as tolerated  To prevent constipation: you may use a stool softener such as -  Colace (over the counter) 100 mg by mouth twice a day  Drink plenty of fluids (prune juice may be helpful) and high fiber foods Miralax (over the counter) for constipation as needed.    Itching:  If you experience itching with your medications, try taking only a single pain pill, or even half a pain pill at a time.  You may take up to 10 pain pills per day, and you can also use benadryl over the counter for itching or also to help with sleep.   Precautions:  If you experience chest pain or shortness of breath - call 911 immediately for transfer to the hospital emergency department!!  If you develop a fever greater that 101 F, purulent drainage from wound, increased redness or drainage from wound, or calf pain -- Call the office at [phone]                                                Follow- Up Appointment:  Please call for an appointment to be seen in 2 weeks [place] - [phone]

## 2022-06-28 NOTE — Interval H&P Note (Signed)
History and Physical Interval Note:  06/28/2022 10:46 AM  Todd [name]  has presented today for surgery, with the diagnosis of right medial meniscus tear.  The various methods of treatment have been discussed with the patient and family. After consideration of risks, benefits and other options for treatment, the patient has consented to  Procedure(s): KNEE ARTHROSCOPY WITH MEDIAL MENISECTOMY (Left) as a surgical intervention.  The patient's history has been reviewed, patient examined, no change in status, stable for surgery.  I have reviewed the patient's chart and labs.  Questions were answered to the patient's satisfaction.     Johnny Bridge

## 2022-06-28 NOTE — Anesthesia Procedure Notes (Signed)
Anesthesia Procedure Image    

## 2022-06-28 NOTE — Transfer of Care (Signed)
Immediate Anesthesia Transfer of Care Note  Patient: Simpson Baumert  Procedure(s) Performed: KNEE ARTHROSCOPY WITH MEDIAL MENISECTOMY (Right: Knee)  Patient Location: PACU  Anesthesia Type:General  Level of Consciousness: awake, alert , oriented, and patient cooperative  Airway & Oxygen Therapy: Patient Spontanous Breathing and Patient connected to face mask oxygen  Post-op Assessment: Report given to RN, Post -op Vital signs reviewed and stable, and Patient moving all extremities  Post vital signs: Reviewed and stable  Last Vitals:  Vitals Value Taken Time  BP 117/85 06/28/22 1335  Temp    Pulse 70 06/28/22 1338  Resp 12 06/28/22 1338  SpO2 100 % 06/28/22 1338  Vitals shown include unvalidated device data.  Last Pain:  Vitals:   06/28/22 1205  TempSrc: Oral         Complications: No notable events documented.

## 2022-06-28 NOTE — Anesthesia Preprocedure Evaluation (Addendum)
Anesthesia Evaluation  Patient identified by MRN, date of birth, ID band Patient awake    Reviewed: Allergy & Precautions, H&P , NPO status , Patient's Chart, lab work & pertinent test results  Airway Mallampati: II  TM Distance: >3 FB Neck ROM: Full    Dental no notable dental hx.    Pulmonary former smoker   Pulmonary exam normal breath sounds clear to auscultation       Cardiovascular hypertension, Pt. 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    GI/Hepatic Neg liver ROS,GERD  ,,  Endo/Other  negative endocrine ROS    Renal/GU negative Renal ROS  negative genitourinary   Musculoskeletal negative musculoskeletal ROS (+)    Abdominal   Peds negative pediatric ROS (+)  Hematology negative hematology ROS (+)   Anesthesia Other Findings   Reproductive/Obstetrics negative OB ROS                             Anesthesia Physical Anesthesia Plan  ASA: 3  Anesthesia Plan: General   Post-op Pain Management: Toradol IV (intra-op)* and Regional block*   Induction: Intravenous  PONV Risk Score and Plan: 2 and Ondansetron, Dexamethasone and Treatment may vary due to age or medical condition  Airway Management Planned: LMA  Additional Equipment:   Intra-op Plan:   Post-operative Plan: Extubation in OR  Informed Consent: I have reviewed the patients History and Physical, chart, labs and discussed the procedure including the risks, benefits and alternatives for the proposed anesthesia with the patient or authorized representative who has indicated his/her understanding and acceptance.     Dental advisory given  Plan Discussed with: CRNA and Surgeon  Anesthesia Plan Comments:        Anesthesia Quick Evaluation

## 2022-06-28 NOTE — Op Note (Signed)
06/28/2022  1:16 PM  PATIENT:  Todd [name]    PRE-OPERATIVE DIAGNOSIS:  right medial meniscus tear  POST-OPERATIVE DIAGNOSIS:  Same  PROCEDURE: RIGHT KNEE ARTHROSCOPY WITH MEDIAL MENISECTOMY  SURGEON:  Johnny Bridge, MD  PHYSICIAN ASSISTANT: Merlene Pulling, PA-C, present and scrubbed throughout the case, critical for completion in a timely fashion, and for retraction, instrumentation, and closure.  ANESTHESIA:   General  PREOPERATIVE INDICATIONS:  Todd [name] is a  47 y.o. male with a diagnosis of right medial meniscus tear who failed conservative measures and elected for surgical management.    The risks benefits and alternatives were discussed with the patient preoperatively including but not limited to the risks of infection, bleeding, nerve injury, cardiopulmonary complications, the need for revision surgery, among others, and the patient was willing to proceed.  ESTIMATED BLOOD LOSS: Minimal  OPERATIVE IMPLANTS: None  OPERATIVE FINDINGS: His patellofemoral joint was essentially normal.  The lateral compartment was intact.  The ACL was intact.  The medial compartment had a complex tear of the meniscus, and extensive grade 2 chondral changes.  No exposed bone.  Medial lateral gutters were clear.  OPERATIVE PROCEDURE: The patient was brought to the operating room and placed in supine position.  General anesthesia was administered.  IV antibiotics were given.  The right lower extremity was prepped and draped in usual sterile fashion.  Timeout performed.  Diagnostic arthroscopy carried out with the above named findings.  The arthroscopic shaver was used to debride the meniscus back to a stable configuration.  I did not really even need to use the basket.  I completed the diagnostic arthroscopy, drained the knee, injected the portals, he was given Toradol as well as an abductor block.  We will try to minimize his narcotics given his history.  The portals were closed with Monocryl  followed by Steri-Strips and sterile gauze.  He was awakened and returned to the PACU in stable and satisfactory condition.  There were no complications and he tolerated the procedure well.

## 2022-06-28 NOTE — Anesthesia Procedure Notes (Signed)
Procedure Name: LMA Insertion Date/Time: 06/28/2022 12:30 PM  Performed by: Victoriano Lain, CRNAPre-anesthesia Checklist: Patient identified, Emergency Drugs available, Suction available, Patient being monitored and Timeout performed Patient Re-evaluated:Patient Re-evaluated prior to induction Oxygen Delivery Method: Circle system utilized Preoxygenation: Pre-oxygenation with 100% oxygen Induction Type: IV induction LMA: LMA with gastric port inserted LMA Size: 4.0 Placement Confirmation: positive ETCO2 and breath sounds checked- equal and bilateral Tube secured with: Tape Dental Injury: Teeth and Oropharynx as per pre-operative assessment

## 2022-06-28 NOTE — Anesthesia Procedure Notes (Signed)
Anesthesia Regional Block: Adductor canal block   Pre-Anesthetic Checklist: , timeout performed,  Correct Patient, Correct Site, Correct Laterality,  Correct Procedure, Correct Position, site marked,  Risks and benefits discussed,  Surgical consent,  Pre-op evaluation,  At surgeon's request and post-op pain management  Laterality: Right  Prep: chloraprep       Needles:  Injection technique: Single-shot  Needle Type: Echogenic Needle     Needle Length: 9cm      Additional Needles:   Procedures:,,,, ultrasound used (permanent image in chart),,    Narrative:  Start time: 06/28/2022 11:59 AM End time: 06/28/2022 12:06 PM Injection made incrementally with aspirations every 5 mL.  Performed by: Personally  Anesthesiologist: Myrtie Soman, MD  Additional Notes: Patient tolerated the procedure well without complications

## 2022-06-29 NOTE — Anesthesia Postprocedure Evaluation (Signed)
Anesthesia Post Note  Patient: Todd [name]  Procedure(s) Performed: KNEE ARTHROSCOPY WITH MEDIAL MENISECTOMY (Right: Knee)     Patient location during evaluation: PACU Anesthesia Type: General Level of consciousness: awake and alert Pain management: pain level controlled Vital Signs Assessment: post-procedure vital signs reviewed and stable Respiratory status: spontaneous breathing, nonlabored ventilation, respiratory function stable and patient connected to nasal cannula oxygen Cardiovascular status: blood pressure returned to baseline and stable Postop Assessment: no apparent nausea or vomiting Anesthetic complications: no  No notable events documented.  Last Vitals:  Vitals:   06/28/22 1415 06/28/22 1427  BP: (!) 129/93 (!) 135/91  Pulse: 60 69  Resp: 14 12  Temp:  36.7 C  SpO2: 94% 100%    Last Pain:  Vitals:   06/28/22 1427  TempSrc: Oral  PainSc: 0-No pain                 Pressley Tadesse S

## 2022-06-30 ENCOUNTER — Encounter (HOSPITAL_COMMUNITY): Payer: Self-pay | Admitting: Orthopedic Surgery

## 2022-06-30 ENCOUNTER — Ambulatory Visit: Payer: 59 | Admitting: Physician Assistant

## 2022-07-05 ENCOUNTER — Other Ambulatory Visit: Payer: Self-pay | Admitting: Physician Assistant

## 2022-07-21 ENCOUNTER — Telehealth (HOSPITAL_BASED_OUTPATIENT_CLINIC_OR_DEPARTMENT_OTHER): Payer: 59 | Admitting: Psychiatry

## 2022-07-21 ENCOUNTER — Encounter (HOSPITAL_COMMUNITY): Payer: Self-pay | Admitting: Psychiatry

## 2022-07-21 ENCOUNTER — Telehealth (HOSPITAL_COMMUNITY): Payer: Medicaid Other | Admitting: Psychiatry

## 2022-07-21 DIAGNOSIS — F4310 Post-traumatic stress disorder, unspecified: Secondary | ICD-10-CM | POA: Diagnosis not present

## 2022-07-21 DIAGNOSIS — F1920 Other psychoactive substance dependence, uncomplicated: Secondary | ICD-10-CM

## 2022-07-21 DIAGNOSIS — F419 Anxiety disorder, unspecified: Secondary | ICD-10-CM

## 2022-07-21 DIAGNOSIS — F32A Depression, unspecified: Secondary | ICD-10-CM

## 2022-07-21 MED ORDER — DIVALPROEX SODIUM 250 MG PO DR TAB: DELAYED_RELEASE_TABLET | ORAL | 1 refills | Status: DC

## 2022-07-21 MED ORDER — HYDROXYZINE HCL 50 MG PO TABS: 100.0000 mg | ORAL_TABLET | Freq: Three times a day (TID) | ORAL | 3 refills | Status: DC | PRN
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
## 2022-07-26 ENCOUNTER — Telehealth: Payer: Self-pay | Admitting: Gastroenterology

## 2022-07-26 ENCOUNTER — Encounter: Payer: 59 | Admitting: Gastroenterology

## 2022-07-26 NOTE — Telephone Encounter (Signed)
Good Morning Dr. Fuller Plan,  We called this patient at 9:00 am patient stated he cancelled his procedure thru My Chart.  He will call back to reschedule.

## 2022-07-26 NOTE — Telephone Encounter (Signed)
Please charge for late [facility] cancellation.  ?

## 2022-08-16 ENCOUNTER — Other Ambulatory Visit (HOSPITAL_COMMUNITY): Payer: Self-pay | Admitting: Psychiatry

## 2022-08-22 ENCOUNTER — Ambulatory Visit (HOSPITAL_BASED_OUTPATIENT_CLINIC_OR_DEPARTMENT_OTHER): Payer: Self-pay | Admitting: Psychiatry

## 2022-08-22 ENCOUNTER — Encounter (HOSPITAL_COMMUNITY): Payer: Self-pay | Admitting: Psychiatry

## 2022-08-22 VITALS — BP 122/77 | HR 85 | Ht 73.0 in | Wt 276.0 lb

## 2022-08-22 DIAGNOSIS — F4310 Post-traumatic stress disorder, unspecified: Secondary | ICD-10-CM

## 2022-08-22 DIAGNOSIS — F1920 Other psychoactive substance dependence, uncomplicated: Secondary | ICD-10-CM

## 2022-08-22 DIAGNOSIS — F32A Depression, unspecified: Secondary | ICD-10-CM

## 2022-08-22 DIAGNOSIS — F419 Anxiety disorder, unspecified: Secondary | ICD-10-CM

## 2022-08-22 MED ORDER — CLONAZEPAM 0.5 MG PO TABS
0.5000 mg | ORAL_TABLET | Freq: Every day | ORAL | 0 refills | Status: DC | PRN
Start: 2022-08-22 — End: 2023-05-19

## 2022-08-22 MED ORDER — HYDROXYZINE HCL 50 MG PO TABS: 50.0000 mg | ORAL_TABLET | Freq: Three times a day (TID) | ORAL | 3 refills | Status: DC | PRN

## 2022-08-22 MED ORDER — SERTRALINE HCL 100 MG PO TABS: 100.0000 mg | ORAL_TABLET | Freq: Every day | ORAL | 0 refills | Status: DC

## 2022-08-22 MED ORDER — QUETIAPINE FUMARATE 300 MG PO TABS
300.0000 mg | ORAL_TABLET | Freq: Every day | ORAL | 0 refills | Status: DC
Start: 2022-08-22 — End: 2023-05-19
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
## 2022-09-13 ENCOUNTER — Telehealth: Payer: Self-pay | Admitting: Physician Assistant

## 2022-09-13 ENCOUNTER — Ambulatory Visit: Payer: 59 | Admitting: Physician Assistant

## 2022-09-13 ENCOUNTER — Ambulatory Visit (INDEPENDENT_AMBULATORY_CARE_PROVIDER_SITE_OTHER): Payer: Medicaid Other | Admitting: Physician Assistant

## 2022-09-13 ENCOUNTER — Encounter: Payer: Self-pay | Admitting: Physician Assistant

## 2022-09-13 VITALS — BP 118/78 | HR 76 | Temp 97.3°F | Ht 73.0 in | Wt 278.2 lb

## 2022-09-13 DIAGNOSIS — M79651 Pain in right thigh: Secondary | ICD-10-CM | POA: Diagnosis not present

## 2022-09-13 DIAGNOSIS — Z6836 Body mass index (BMI) 36.0-36.9, adult: Secondary | ICD-10-CM

## 2022-09-13 DIAGNOSIS — I10 Essential (primary) hypertension: Secondary | ICD-10-CM | POA: Diagnosis not present

## 2022-09-13 DIAGNOSIS — E782 Mixed hyperlipidemia: Secondary | ICD-10-CM | POA: Diagnosis not present

## 2022-09-13 DIAGNOSIS — E6609 Other obesity due to excess calories: Secondary | ICD-10-CM

## 2022-09-13 NOTE — Progress Notes (Signed)
Subjective:    Patient ID: Todd [name], male    DOB: 20-Jan-1976, 47 y.o.   MRN: [id number]  Chief Complaint  Patient presents with   Medical Management of Chronic Issues    Pt returning for 3 mon f/u with fasting labs and med check; pt not fasting this morning; can return for fasting labs; had surgery on left knee last Thursday; pt has no concerns to discuss other than still having sciatica pain and burning in right thigh. Pt would like to discuss if eligible to do Cologuard versus Colonoscopy    HPI Patient is in today for 3 mo f/up. See A/P.   Past Medical History:  Diagnosis Date   Anxiety    Depression    GERD (gastroesophageal reflux disease)    Hepatitis C    2014   Hyperlipidemia    Hypertension    Pneumonia    Pre-diabetes    PTSD (post-traumatic stress disorder)    Sciatica    Substance abuse (HCC)    IV OPIATES CLEAN FOR 5 MONTHS UPDATED 06/27/22    Past Surgical History:  Procedure Laterality Date   KNEE ARTHROSCOPY WITH MEDIAL MENISECTOMY Right 06/28/2022   Procedure: KNEE ARTHROSCOPY WITH MEDIAL MENISECTOMY;  Surgeon: Teryl Lucy, MD;  Location: [facility];  Service: Orthopedics;  Laterality: Right;   MENSICUS Right    06/28/22   MOUTH SURGERY     TESTICLE TORSION REDUCTION     age 47    Family History  Problem Relation Age of Onset   Drug abuse Mother    Melanoma Mother    COPD Father    Hypertension Father    Diabetes Mellitus II Father    Heart disease Father    Depression Father    Anxiety disorder Father    Colon polyps Paternal Uncle    Colon cancer Neg Hx    Crohn's disease Neg Hx    Esophageal cancer Neg Hx    Rectal cancer Neg Hx    Stomach cancer Neg Hx    Ulcerative colitis Neg Hx     Social History   Tobacco Use   Smoking status: Former    Packs/day: 1    Types: Cigarettes    Quit date: 03/08/2021    Years since quitting: 1.5   Smokeless tobacco: Never   Tobacco comments:    Pt declines teaching  Vaping Use   Vaping  Use: Some days   Substances: Nicotine, Flavoring  Substance Use Topics   Alcohol use: Not Currently   Drug use: Not Currently    Types: IV, Heroin, Cocaine, Marijuana, Methamphetamines    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)     Allergies  Allergen Reactions   Penicillins Anaphylaxis and Hives   Valproic Acid Other (See Comments)    "my head turns involuntarily to the right"  Neck rigidity    Haloperidol Lactate     tardive dyskinesia    Review of Systems NEGATIVE UNLESS OTHERWISE INDICATED IN HPI      Objective:     BP 118/78 (BP Location: Left Arm)   Pulse 76   Temp (!) 97.3 F (36.3 C) (Temporal)   Ht 6\' 1"  (1.854 m)   Wt 278 lb 3.2 oz (126.2 kg)   SpO2 94%   BMI 36.70 kg/m   Wt Readings from Last 3 Encounters:  09/13/22 278 lb 3.2 oz (126.2 kg)  06/28/22 262 lb (118.8 kg)  06/27/22 262 lb (118.8 kg)  BP Readings from Last 3 Encounters:  09/13/22 118/78  06/28/22 (!) 135/91  06/14/22 106/70     Physical Exam Vitals and nursing note reviewed.  Constitutional:      General: He is not in acute distress.    Appearance: Normal appearance. He is obese. He is not toxic-appearing.  HENT:     Head: Normocephalic and atraumatic.     Right Ear: External ear normal.     Left Ear: External ear normal.  Eyes:     Extraocular Movements: Extraocular movements intact.     Conjunctiva/sclera: Conjunctivae normal.     Pupils: Pupils are equal, round, and reactive to light.  Cardiovascular:     Rate and Rhythm: Normal rate and regular rhythm.     Pulses: Normal pulses.     Heart sounds: Normal heart sounds.  Pulmonary:     Effort: Pulmonary effort is normal.     Breath sounds: Normal breath sounds.  Musculoskeletal:        General: No swelling or tenderness. Normal range of motion.     Cervical back: Normal range of motion and neck supple.     Right lower leg: No edema.     Left lower leg: No edema.     Comments: Left knee in a brace  secondary to recent surgery  Skin:    General: Skin is warm and dry.  Neurological:     General: No focal deficit present.     Mental Status: He is alert and oriented to person, place, and time.  Psychiatric:        Mood and Affect: Mood normal.        Behavior: Behavior normal.        Assessment & Plan:  Essential hypertension Assessment & Plan: Normotensive  Cont Chlorthalidone 12.5 mg, lisinopril 20 mg daily Prescriptions refilled Monitor at home   Class 2 obesity due to excess calories without serious comorbidity with body mass index (BMI) of 36.0 to 36.9 in adult -     Amb Referral To Provider Referral Exercise Program (P.R.E.P)  Mixed hyperlipidemia Assessment & Plan: Currently taking Lipitor 10 mg daily and tolerating well. Update fasting labs this week.   The 10-year ASCVD risk score (Arnett DK, et al., 2019) is: 10.4%   Values used to calculate the score:     Age: 47 years     Sex: Male     Is Non-Hispanic African American: Yes     Diabetic: No     Tobacco smoker: Yes     Systolic Blood Pressure: 118 mmHg     Is BP treated: Yes     HDL Cholesterol: 45.9 mg/dL     Total Cholesterol: 177 mg/dL    Right thigh pain -     Ambulatory referral to Sports Medicine   -complains of burning pain R anterior thigh x 7-8 months, quit sitting with wallet in his pocket, still having burning pain waking him up, gabapentin only helping somewhat - referral to sports med for additional eval      Return in about 4 months (around 01/13/2023) for recheck/follow-up.    Mckinnley Cottier M Damon Hargrove, PA-C

## 2022-09-13 NOTE — Patient Instructions (Signed)
Schedule for fasting labs this week Schedule for next recheck in 4 months

## 2022-09-13 NOTE — Assessment & Plan Note (Signed)
Normotensive  Cont Chlorthalidone 12.5 mg, lisinopril 20 mg daily Prescriptions refilled Monitor at home 

## 2022-09-13 NOTE — Telephone Encounter (Signed)
Please advise what labs you are wanting pt to check other than Lipid panel if any.

## 2022-09-13 NOTE — Assessment & Plan Note (Signed)
Currently taking Lipitor 10 mg daily and tolerating well. Update fasting labs this week.   The 10-year ASCVD risk score (Arnett DK, et al., 2019) is: 10.4%   Values used to calculate the score:     Age: 47 years     Sex: Male     Is Non-Hispanic African American: Yes     Diabetic: No     Tobacco smoker: Yes     Systolic Blood Pressure: 118 mmHg     Is BP treated: Yes     HDL Cholesterol: 45.9 mg/dL     Total Cholesterol: 177 mg/dL

## 2022-09-13 NOTE — Addendum Note (Signed)
Addended by: Ila Mcgill on: 09/13/2022 01:22 PM   Modules accepted: Orders

## 2022-09-13 NOTE — Telephone Encounter (Signed)
Pt states he is to come back for labs. Pt is scheduled for 09/19/22. Please place orders as needed.

## 2022-09-19 ENCOUNTER — Other Ambulatory Visit (INDEPENDENT_AMBULATORY_CARE_PROVIDER_SITE_OTHER): Payer: Medicaid Other

## 2022-09-19 DIAGNOSIS — E782 Mixed hyperlipidemia: Secondary | ICD-10-CM

## 2022-09-19 LAB — LIPID PANEL
Cholesterol: 149 mg/dL (ref 0–200)
HDL: 39.6 mg/dL (ref >39.00)
LDL Cholesterol: 86 mg/dL (ref 0–99)
NonHDL: 109.82
Total CHOL/HDL Ratio: 4
Triglycerides: 118 mg/dL (ref 0.0–149.0)
VLDL: 23.6 mg/dL (ref 0.0–40.0)

## 2022-09-21 ENCOUNTER — Other Ambulatory Visit: Payer: Self-pay

## 2022-09-21 ENCOUNTER — Ambulatory Visit (HOSPITAL_COMMUNITY): Payer: 59 | Admitting: Psychiatry

## 2022-09-21 ENCOUNTER — Encounter (HOSPITAL_COMMUNITY): Payer: Self-pay

## 2022-09-21 DIAGNOSIS — E782 Mixed hyperlipidemia: Secondary | ICD-10-CM

## 2022-09-21 MED ORDER — ATORVASTATIN CALCIUM 20 MG PO TABS
20.0000 mg | ORAL_TABLET | Freq: Every day | ORAL | 3 refills | Status: AC
Start: 2022-09-21 — End: ?
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
## 2022-09-22 NOTE — Progress Notes (Signed)
error 

## 2022-09-23 ENCOUNTER — Encounter: Payer: Medicaid Other | Admitting: Sports Medicine

## 2022-09-26 NOTE — Progress Notes (Signed)
This encounter was created in error - please disregard.

## 2022-10-17 ENCOUNTER — Ambulatory Visit: Payer: 59 | Admitting: Physician Assistant

## 2022-10-25 ENCOUNTER — Other Ambulatory Visit: Payer: Self-pay | Admitting: Physician Assistant

## 2022-11-07 ENCOUNTER — Other Ambulatory Visit: Payer: Self-pay

## 2022-11-07 ENCOUNTER — Telehealth: Payer: Self-pay | Admitting: Physician Assistant

## 2022-11-07 DIAGNOSIS — F419 Anxiety disorder, unspecified: Secondary | ICD-10-CM

## 2022-11-07 DIAGNOSIS — F4310 Post-traumatic stress disorder, unspecified: Secondary | ICD-10-CM

## 2022-11-07 MED ORDER — SERTRALINE HCL 100 MG PO TABS
100.0000 mg | ORAL_TABLET | Freq: Every day | ORAL | 0 refills | Status: AC
Start: 2022-11-07 — End: 2023-02-05

## 2022-11-07 NOTE — Telephone Encounter (Signed)
Patient states he lost the following medication while out of town.  Requests RX for  sertraline (ZOLOFT) 100 MG tablet Be sent to  [id number] [place], [place] - [address] Phone: [phone]  Fax: [phone]

## 2022-11-07 NOTE — Telephone Encounter (Signed)
Sent in rx to pharmacy.

## 2022-11-07 NOTE — Telephone Encounter (Signed)
Last filled on 08/22/22 for 90 day supply

## 2022-12-29 ENCOUNTER — Encounter (INDEPENDENT_AMBULATORY_CARE_PROVIDER_SITE_OTHER): Payer: Self-pay

## 2023-01-02 ENCOUNTER — Other Ambulatory Visit (HOSPITAL_COMMUNITY): Payer: Self-pay | Admitting: Psychiatry

## 2023-01-02 DIAGNOSIS — F4310 Post-traumatic stress disorder, unspecified: Secondary | ICD-10-CM

## 2023-01-02 DIAGNOSIS — F32A Depression, unspecified: Secondary | ICD-10-CM

## 2023-01-17 ENCOUNTER — Encounter: Payer: Self-pay | Admitting: Physician Assistant

## 2023-01-17 ENCOUNTER — Ambulatory Visit: Payer: Medicaid Other | Admitting: Physician Assistant

## 2023-02-10 ENCOUNTER — Telehealth: Payer: Self-pay | Admitting: Physician Assistant

## 2023-02-10 ENCOUNTER — Other Ambulatory Visit: Payer: Self-pay

## 2023-02-10 DIAGNOSIS — F4310 Post-traumatic stress disorder, unspecified: Secondary | ICD-10-CM

## 2023-02-10 DIAGNOSIS — F32A Depression, unspecified: Secondary | ICD-10-CM

## 2023-02-10 MED ORDER — LISINOPRIL 20 MG PO TABS: 20.0000 mg | ORAL_TABLET | Freq: Every day | ORAL | 2 refills | Status: DC

## 2023-02-10 MED ORDER — CHLORTHALIDONE 25 MG PO TABS: 12.5000 mg | ORAL_TABLET | Freq: Every day | ORAL | 2 refills | Status: DC

## 2023-02-10 MED ORDER — SERTRALINE HCL 100 MG PO TABS
100.0000 mg | ORAL_TABLET | Freq: Every day | ORAL | 0 refills | Status: DC
Start: 2023-02-10 — End: 2023-05-19

## 2023-02-10 NOTE — Telephone Encounter (Signed)
Prescription Request  02/10/2023  LOV: 09/13/2022  NEXT OV IS 03/01/23  What is the name of the medication or equipment? sertraline (ZOLOFT) 100 MG tablet   chlorthalidone (HYGROTON) 25 MG table   lisinopril (ZESTRIL) 20 MG tablet    Have you contacted your pharmacy to request a refill? Yes   Which pharmacy would you like this sent to?  [phone] [place], [place] - [address] [place] [address] Phone: [phone] Fax: [phone]    Patient notified that their request is being sent to the clinical staff for review and that they should receive a response within 2 business days.   Please advise at Mobile [phone] (mobile)

## 2023-02-10 NOTE — Telephone Encounter (Signed)
Rx sent to pharmacy and pt advised.

## 2023-03-01 ENCOUNTER — Ambulatory Visit: Payer: MEDICAID | Admitting: Physician Assistant

## 2023-05-15 ENCOUNTER — Ambulatory Visit (HOSPITAL_COMMUNITY)
Admission: EM | Admit: 2023-05-15 | Discharge: 2023-05-15 | Disposition: A | Discharge status: Home / Self Care | Payer: MEDICAID | Attending: Psychiatry | Admitting: Psychiatry

## 2023-05-15 DIAGNOSIS — F419 Anxiety disorder, unspecified: Secondary | ICD-10-CM | POA: Insufficient documentation

## 2023-05-15 DIAGNOSIS — R45851 Suicidal ideations: Secondary | ICD-10-CM | POA: Insufficient documentation

## 2023-05-15 DIAGNOSIS — F4310 Post-traumatic stress disorder, unspecified: Secondary | ICD-10-CM | POA: Insufficient documentation

## 2023-05-15 DIAGNOSIS — F22 Delusional disorders: Secondary | ICD-10-CM | POA: Insufficient documentation
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
## 2023-05-15 NOTE — Discharge Instructions (Addendum)
Go to open access hours at the [facility] to establish services:   Walk in hours for medication management Monday- Friday from 8:00 AM to 11:00 AM Recommend arriving by by 7:15 AM.  It is first come first serve.  Please go to 2cd floor   Walk in hours for therapy intake Monday, Wednesday and Thursday only 8:00 AM to 11:00 AM Encouraged to arrive by 7:15 AM. It is first come first serve.   Discharge recommendations:  Please see information for follow-up appointment with psychiatry and therapy. Please follow up with your primary care provider for all medical related needs.    Therapy: We recommend that patient participate in individual therapy to address mental health concerns.   Safety:  The patient should abstain from use of illicit substances/drugs and abuse of any medications. If symptoms worsen or do not continue to improve or if the patient becomes actively suicidal or homicidal then it is recommended that the patient return to the closest hospital emergency department, the [facility], or call 911 for further evaluation and treatment. National Suicide Prevention Lifeline 1-800-SUICIDE or 346-379-0993.   About 988 988 offers 24/7 access to trained crisis counselors who can help people experiencing mental health-related distress. People can call or text 988 or chat 988lifeline.org for themselves or if they are worried about a loved one who may need crisis support.        Discharge recommendations:   Medications: Patient is to take medications as prescribed. The patient or patient's guardian is to contact a medical professional and/or outpatient provider to address any new side effects that develop. The patient or the patient's guardian should update outpatient providers of any new medications and/or medication changes.    Outpatient Follow up: Please review list of outpatient resources for psychiatry and  counseling. Please follow up with your primary care provider for all medical related needs.    Therapy: We recommend that patient participate in individual therapy to address mental health concerns.   Atypical antipsychotics: If you are prescribed an atypical antipsychotic, it is recommended that your height, weight, BMI, blood pressure, fasting lipid panel, and fasting blood sugar be monitored by your outpatient providers.  Safety:   The following safety precautions should be taken:   No sharp objects. This includes scissors, razors, scrapers, and putty knives.   Chemicals should be removed and locked up.   Medications should be removed and locked up.   Weapons should be removed and locked up. This includes firearms, knives and instruments that can be used to cause injury.   The patient should abstain from use of illicit substances/drugs and abuse of any medications.  If symptoms worsen or do not continue to improve or if the patient becomes actively suicidal or homicidal then it is recommended that the patient return to the closest hospital emergency department, the [facility], or call 911 for further evaluation and treatment. National Suicide Prevention Lifeline 1-800-SUICIDE or (712) 347-1275.  About 988 988 offers 24/7 access to trained crisis counselors who can help people experiencing mental health-related distress. People can call or text 988 or chat 988lifeline.org for themselves or if they are worried about a loved one who may need crisis support.    Based on what you have shared, a list of resources for outpatient therapy and psychiatry is provided below to get you started back on treatment.  It is imperative that you follow through with treatment within 5-7  days from the day of discharge to prevent any further risk to your safety or mental well-being.  You are not limited to the list provided.  In case of an urgent crisis, you may contact the  Mobile Crisis Unit with [facility] at 1.212-111-4385.        Outpatient Services for Therapy and Medication Management for [facility] [address], [place], [address] [phone] phone  New Patient Assessment/Therapy Walk-ins Monday and Wednesday: 8am until slots are full. Every 1st and 2nd Friday: 1pm - 5pm  NO ASSESSMENT/THERAPY WALK-INS ON TUESDAYS OR THURSDAYS  New Patient Psychiatry/Medication Management Walk-ins Monday-Friday: 8am-11am  For all walk-ins, we ask that you arrive by 7:30am because patient will be seen in the order of arrival.  Availability is limited; therefore, you may not be seen on the same day that you walk-in.  Our goal is to serve and meet the needs of our community to the best of our ability.   [facility] [address] [place], [place], [address] [phone] phone  [facility] [address] [place], [place], [address] [phone] phone [phone] phone ([address], [address], Anthem/Elevance, [address], [address], [address], [address], Healthy [place], [place], [place], [address], [employer], [place], UHC, [employer], [place], Out of Network)  [employer], [place] [address] [place], [place], [address] [phone] phone ([place], Anthem/Elevance, [employer] Options/Carelon, BCBS, [address], [place], [place], [place], [place], [employer], [place], [place], [place], [facility])  [employer] [address] [place], [place], [address] [phone] phone (Medicaid, ask about other insurance)  [facility] [address], [address] [place], [place], [address] [phone] phone [phone] fax ([address] [place] , [place], [place], [place] Health Choice, UHC, General Electric, Self-Pay)  [facility] [address] [place], [place], [address] [phone] phone ([address]  Anthem/Elevance, [address], [address], [place], [employer], [place], [place], [place], [employer], [place], [place], [place], [facility])  [facility] - 6-8 MONTH WAIT FOR THERAPY; [facility] [address], [address] [place], [place], [address] [phone] phone ([address] AmeriHealth [address] - [place], [address], [place], [place], Friday Health Plans, [address], BCBS Healthy [place], [place], [address], [place], [place], [place], [place], Tricare, UHC, Safeco Corporation, [place])  [facility] [address] [place], [place], [address] [phone] phone  [facility] [address], [address] [place], [place], [address] [phone] phone  [facility] [address] [place], [place], [address] [phone] phone  [facility] - THERAPY ONLY [address], [place], [address] [phone] phone  [facility], [place] [address], [place], [address] [phone] phone  [facility] [address] [place], [place], [address] [phone] phone [phone] fax  [facility] [address] [place], [place], [address] [phone] phone [phone] fax  [facility] [phone] [address] [place], [place], [address] [phone] phone  Jovita Kussmaul [address] [place], [place], [address]  [phone] phone  [facility]  (Adults Only) [address] [place], [place], [address]  [phone] phone [phone] fax

## 2023-05-15 NOTE — ED Provider Notes (Signed)
Behavioral Health Urgent Care Medical Screening Exam  Patient Name: Todd [name] MRN: [id number] Date of Evaluation: 05/15/23 Chief Complaint:  Paranoia  Diagnosis:  Final diagnoses:  Paranoia (HCC)    History of Present illness: Todd [name] is a 47 y.o. malepatient presented to [facility] as a walk in  unaccompanied with complaints of "paranoia"  Todd [name], 47 y.o., male patient seen face to face by this provider and chart reviewed on 05/15/23.  Per chart review patient has a past psychiatric history of MDD, anxiety, PTSD, and polysubstance abuse.  Patient has a history of 1 inpatient psychiatric admission but states that was over 20 years ago.  Patient has taken Zoloft, Seroquel, Atarax, Klonopin, methadone, gabapentin and Depakote in the past.  He currently has no outpatient psychiatric services in place.  He works full-time in [employer].  He denies any current substance use.  On evaluation Todd [name] reports he used to have services in place with Dr. Everlena Cooper with [facility].  States at that time he was prescribed Seroquel and Zoloft and felt that they were effective in stabilizing his mood.  However he stopped taking medications about 6 months ago.  He has noticed some mood instability over the past few months and is requesting today to be restarted back on his medications.  He is also exhibiting some paranoia and feels as though people are watching him.  States his paranoia does not impair him from going to work or performing his ADLs.  He did contact Dr. Terisa Starr office today and they sent him to [facility] to be restarted on medications.  Patient states, "I do not know why they sent me here I am not in crisis".  Explained services on the second floor for [facility]   During evaluation Todd [name] is observed sitting in the assessment room in no acute distress.  He is well-groomed and makes good eye contact.  He has normal speech and behavior.  He is alert/oriented x 4,  cooperative, and attentive.  He appears anxious and states in a jokingly manner, "I did not come here to get locked up".  He denies any current depression but does admit that he feels that his mood is up-and-down.  He denies any issues with appetite or sleep.  He denies any current suicidal ideations.  He does admit that during the holidays he had passive suicidal ideations with no plan, intent, or access to means.  However he is adamant he would never act on those thoughts.  He identifies his for daughters as his protective factors.  He denies any access to firearms/weapons.  He denies HI/AVH. Objectively there is no evidence of psychosis/mania or delusional thinking.  Patient is able to converse coherently, goal directed thoughts, no distractibility, or pre-occupation. Patient answered question appropriately.    Discussed open access hours for medication management and therapy with [facility] on second floor.  There are no available walk-in appointments until 05/17/2022.  Patient agrees to present at that time for medication management.  Patient declined for any collateral to be contacted.  Flowsheet Row ED from 05/15/2023 in [facility] ED from 05/19/2022 in [facility] at [facility] ED from 04/05/2022 in [facility] at [facility] Suite RISK CATEGORY No Risk No Risk No Risk       Psychiatric Specialty Exam  Presentation  General Appearance:Appropriate for Environment; Well Groomed  Eye Contact:Good  Speech:Clear and Coherent; Normal Rate  Speech Volume:Normal  Handedness:Right   Mood and Affect  Mood: Anxious  Affect: Congruent   Thought Process  Thought Processes: Coherent  Descriptions of Associations:Intact  Orientation:Full (Time, Place and Person)  Thought Content:Logical    Hallucinations:None  Ideas of Reference:None  Suicidal Thoughts:No  Homicidal Thoughts:No   Sensorium  Memory: Immediate Good; Recent  Good; Remote Good  Judgment: Good  Insight: Good   Executive Functions  Concentration: Good  Attention Span: Good  Recall: Good  Fund of Knowledge: Good  Language: Good   Psychomotor Activity  Psychomotor Activity: Normal   Assets  Assets: Communication Skills; Desire for Improvement; Financial Resources/Insurance; Housing; Physical Health; Intimacy; Resilience; Social Support   Sleep  Sleep: Good  Number of hours: No data recorded  Physical Exam: Physical Exam Vitals and nursing note reviewed.  Constitutional:      Appearance: Normal appearance.  Eyes:     General:        Right eye: No discharge.        Left eye: No discharge.  Cardiovascular:     Rate and Rhythm: Normal rate.  Pulmonary:     Effort: Pulmonary effort is normal. No respiratory distress.  Musculoskeletal:        General: Normal range of motion.     Cervical back: Normal range of motion.  Skin:    Coloration: Skin is not jaundiced or pale.  Neurological:     Mental Status: He is alert and oriented to person, place, and time.  Psychiatric:        Attention and Perception: Attention and perception normal.        Mood and Affect: Mood is anxious.        Speech: Speech normal.        Behavior: Behavior normal. Behavior is cooperative.        Thought Content: Thought content normal.        Cognition and Memory: Cognition normal.        Judgment: Judgment normal.    Review of Systems  Constitutional: Negative.   HENT: Negative.    Eyes: Negative.   Respiratory: Negative.    Cardiovascular: Negative.   Musculoskeletal: Negative.   Skin: Negative.   Neurological: Negative.   Psychiatric/Behavioral:  The patient is nervous/anxious.    Blood pressure 138/79, pulse 89, temperature 98.7 F (37.1 C), temperature source Oral, resp. rate 20, SpO2 100%. There is no height or weight on file to calculate BMI.  Musculoskeletal: Strength & Muscle Tone: within normal limits Gait &  Station: normal Patient leans: N/A   [facility] MSE Discharge Disposition for Follow up and Recommendations: Based on my evaluation the patient does not appear to have an emergency medical condition and can be discharged with resources and follow up care in outpatient services for Medication Management and Individual Therapy  Discharge patient  Patient agrees to present for open access walk-in hours for [facility] outpatient services.  There are no walk-in available appointments until 05/17/2022.  Patient agrees to present at that time.  Provided other outpatient psychiatric resources outside of [facility] health.   Ardis Hughs, NP 05/15/2023, 12:53 PM

## 2023-05-18 ENCOUNTER — Ambulatory Visit (INDEPENDENT_AMBULATORY_CARE_PROVIDER_SITE_OTHER): Payer: MEDICAID | Admitting: Mental Health

## 2023-05-18 DIAGNOSIS — F332 Major depressive disorder, recurrent severe without psychotic features: Secondary | ICD-10-CM

## 2023-05-18 DIAGNOSIS — F4310 Post-traumatic stress disorder, unspecified: Secondary | ICD-10-CM | POA: Diagnosis not present

## 2023-05-19 ENCOUNTER — Ambulatory Visit (INDEPENDENT_AMBULATORY_CARE_PROVIDER_SITE_OTHER): Payer: MEDICAID | Admitting: Student

## 2023-05-19 ENCOUNTER — Encounter (HOSPITAL_COMMUNITY): Payer: Self-pay | Admitting: Student

## 2023-05-19 VITALS — BP 140/90 | HR 67

## 2023-05-19 DIAGNOSIS — F419 Anxiety disorder, unspecified: Secondary | ICD-10-CM | POA: Diagnosis not present

## 2023-05-19 DIAGNOSIS — F4310 Post-traumatic stress disorder, unspecified: Secondary | ICD-10-CM

## 2023-05-19 DIAGNOSIS — F333 Major depressive disorder, recurrent, severe with psychotic symptoms: Secondary | ICD-10-CM | POA: Diagnosis not present

## 2023-05-19 DIAGNOSIS — F32A Depression, unspecified: Secondary | ICD-10-CM

## 2023-05-19 MED ORDER — QUETIAPINE FUMARATE 100 MG PO TABS
100.0000 mg | ORAL_TABLET | Freq: Every day | ORAL | 0 refills | Status: DC
Start: 2023-05-19 — End: 2023-06-27

## 2023-05-19 MED ORDER — SERTRALINE HCL 50 MG PO TABS
ORAL_TABLET | ORAL | 0 refills | Status: DC
Start: 2023-05-19 — End: 2023-06-27

## 2023-05-19 MED ORDER — HYDROXYZINE HCL 50 MG PO TABS: 50.0000 mg | ORAL_TABLET | Freq: Three times a day (TID) | ORAL | 0 refills | Status: DC | PRN
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
## 2023-05-23 ENCOUNTER — Telehealth (HOSPITAL_COMMUNITY): Payer: Self-pay

## 2023-05-23 ENCOUNTER — Telehealth (HOSPITAL_COMMUNITY): Payer: Self-pay | Admitting: *Deleted

## 2023-05-23 NOTE — Telephone Encounter (Signed)
 Medication management - Prior authorization for patient's prescribed Quetiapine 100 mg tablets submitted online with CoverMyMeds and sent to [facility] for review and pending decision.

## 2023-05-23 NOTE — Telephone Encounter (Signed)
 Fax received for approval of Quetiapine Fumarate 100mg  until 05/22/2024. Called to notify pharmacy.

## 2023-05-24 ENCOUNTER — Other Ambulatory Visit: Payer: Self-pay | Admitting: Physician Assistant

## 2023-05-24 NOTE — Telephone Encounter (Signed)
 Read and acknowledged. Thanks.  -Dr. Hazle Quant

## 2023-05-24 NOTE — Telephone Encounter (Signed)
 Last OV: 09/13/22  Next OV: none  Last Filled: 06/24/22  Quantity: 270 w/ 0 refills

## 2023-06-20 ENCOUNTER — Other Ambulatory Visit: Payer: Self-pay | Admitting: Physician Assistant

## 2023-06-20 NOTE — Telephone Encounter (Signed)
Last OV: 09/13/22  Next OV: none scheduled  Last Filled: 06/24/22  Quantity: 270 w/ 0 refills

## 2023-06-21 ENCOUNTER — Other Ambulatory Visit: Payer: Self-pay | Admitting: Physician Assistant

## 2023-06-22 ENCOUNTER — Encounter (HOSPITAL_COMMUNITY): Payer: MEDICAID | Admitting: Student

## 2023-06-25 NOTE — Progress Notes (Cosign Needed)
 BH MD Outpatient Progress Note  06/27/2023 1:14 PM Todd [name]  MRN:  [id number]  Assessment:  Todd [name] is a 48 y.o. male with a history of anxiety, depression, PTSD, and polysubstance abuse (alcohol, marijuana, cocaine, opiates, and methamphetamines) who is an established patient with [facility] for medication management. He had been previously seen by Dr. Everlena Cooper for management of depression. He also has been on methadone for management of opiate use disorder. Last known psychotropic regiment included quetiapine 300 mg at bedtime, sertraline 100 mg daily, methadone daily, and gabapentin.   Today, 06/27/2023, patient reports significant improvement in symptoms of depression and PTSD.  He denies any safety concerns including passive suicidal ideation.  He denies any acute somatic complaints from initiation of Zoloft and Seroquel.  We reviewed symptom profile to better clarify his diagnosis and patient's symptoms appear more consistent with intermittent explosive disorder rather than bipolar disorder due to lack of significant manic features outside of former substance abuse as well as experiencing anger out of proportion to situation without premeditation.  Will review whether quetiapine is necessary for management of patient's intermittent explosive disorder by decreasing quetiapine by 50 mg and have patient follow-up in 1 month.  Also referred patient to establish care with [facility] due to his current PCP being too far away.  Patient also reported continued to use cannabis at this time so his anxiety and depressive symptoms may not be appropriately treated at this time with his current psychotropic regimen.  Will also set patient up with a lab appointment to obtain metabolic labs   Plan:  # Major Depressive Disorder, severe, recurrent episode w/ psychotic features Past medication trials:  Status of problem: active Interventions: --  Continue sertraline 100 mg daily -- Decrease Seroquel to 50 mg nightly  -Metabolic labs: reordered Lipid Panel, A1c, and ECG  -last labs 06/28/22 ECG Qtc 450, 09/19/22 lipid panel wnl, A1c wnl -- psychotherapy  #Intermittent Explosive Disorder   # Post Traumatic Stress Disorder Past medication trials:  Status of problem: active Interventions: -- sertraline as above --Hydroxyzine 50 mg 3 times daily as needed for anxiety -- psychotherapy  # Opiate Use Disorder, in sustained remission Past medication trials:  Interventions: -- has not used since 2023 -- methadone per methadone clinic  #Stimulant Use Disorder, cocaine, in sustained remission #Alcohol Use Disorder, in sustained remission     Return to care in 4 weeks  Patient was given contact information for behavioral health clinic and was instructed to call 911 for emergencies.    Patient and plan of care will be discussed with the Attending MD, Dr. Josephina Shih, who agrees with the above statement and plan.   Subjective:  Chief Complaint: Medication Management   Interval History:  Patient reports dramatic improvement in depression.  He reports no longer experiencing several crying spells every week and he has not had any passive suicidal ideation for a while.  He reports also speaking with sobriety of his family for social support as well as discovering he has a 19 year old daughter due to [email or web address].  He denies SI/HI/AVH.  He reports sleeping and eating well.  He reports he has not had significant lability recently but this is due to having multiple coping skills as well as seeing a counselor for both substance use as well as Todd [name] here at [facility].  He also reports that he is no longer feeling as hypervigilant as he did before.  He endorsed that he was  still using cannabis a few blunts per day.  He reports that it helped significantly with his anxiety PTSD symptoms.  We discussed the necessity of cannabis use and whether he felt  like he had any dependence on it and he denied feeling he had some form of dependence on it but also admits he has been using it for a very long time.    Visit Diagnosis:    ICD-10-CM   1. Essential hypertension  I10 Ambulatory referral to Internal Medicine    Comprehensive Metabolic Panel (CMET)    AMB Referral [facility] Care Management    2. Dyslipidemia  E78.5 Ambulatory referral to Internal Medicine    AMB Referral [facility] Care Management    3. Healthcare maintenance  Z00.00 HgB A1c    Lipid panel    TSH + free T4    CBC w/Diff/Platelet    AMB Referral [facility] Care Management    VITAMIN D 25 Hydroxy (Vit-D Deficiency, Fractures)    4. PTSD (post-traumatic stress disorder)  F43.10 hydrOXYzine (ATARAX) 50 MG tablet    QUEtiapine (SEROQUEL) 50 MG tablet    sertraline (ZOLOFT) 100 MG tablet    5. Anxiety and depression  F41.9 hydrOXYzine (ATARAX) 50 MG tablet   F32.A QUEtiapine (SEROQUEL) 50 MG tablet    sertraline (ZOLOFT) 100 MG tablet       Past Psychiatric History:  Patient has had prior psychiatric hospitalizations in 2014, 2017, and 2019 and several in other locations in the context of MDD and polysubstance use.  He has been connected with psychiatric providers in the past the last 1 was in [place].  Patient reports 2 suicide attempts when he was 19 and 22.  He was the one at 22 was after overdose and he had to be medically cleared before being transferred to the psychiatric hospital.   Patient has taken clonidine, propranolol, Atarax, gabapentin, Ativan, Xanax, methadone, Zoloft, Cymbalta, Prozac, Paxil (irritable), trazodone (priapism), Seroquel, and zolpidem in the past. Haldol (TD), Abilify, Depakote.    Has used cocaine, cannabis, and opiates in the past.  Past Medical History:  Past Medical History:  Diagnosis Date   Anxiety    Cocaine dependence (HCC) 02/24/2012   Depression    GERD (gastroesophageal reflux disease)    Hepatitis C    2014   Hyperlipidemia     Hypertension    Opiate withdrawal (HCC) 02/24/2012   Pneumonia    Pre-diabetes    PTSD (post-traumatic stress disorder)    Sciatica    Substance abuse (HCC)    IV OPIATES CLEAN FOR 5 MONTHS UPDATED 06/27/22    Past Surgical History:  Procedure Laterality Date   KNEE ARTHROSCOPY WITH MEDIAL MENISECTOMY Right 06/28/2022   Procedure: KNEE ARTHROSCOPY WITH MEDIAL MENISECTOMY;  Surgeon: Teryl Lucy, MD;  Location: [facility];  Service: Orthopedics;  Laterality: Right;   MENSICUS Right    06/28/22   MOUTH SURGERY     TESTICLE TORSION REDUCTION     age 14      Family History:  Family History  Problem Relation Age of Onset   Drug abuse Mother    Melanoma Mother    COPD Father    Hypertension Father    Diabetes Mellitus II Father    Heart disease Father    Depression Father    Anxiety disorder Father    Colon polyps Paternal Uncle    Colon cancer Neg Hx    Crohn's disease Neg Hx    Esophageal cancer Neg Hx  Rectal cancer Neg Hx    Stomach cancer Neg Hx    Ulcerative colitis Neg Hx     Social History:   Social History   Socioeconomic History   Marital status: Single    Spouse name: Not on file   Number of children: Not on file   Years of education: Not on file   Highest education level: Not on file  Occupational History   Not on file  Tobacco Use   Smoking status: Former    Current packs/day: 0.00    Types: Cigarettes    Quit date: 03/08/2021    Years since quitting: 2.3   Smokeless tobacco: Never   Tobacco comments:    Pt declines teaching  Vaping Use   Vaping status: Some Days   Substances: Nicotine, Flavoring  Substance and Sexual Activity   Alcohol use: Not Currently   Drug use: Not Currently    Types: IV, Heroin, Cocaine, Marijuana, Methamphetamines    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)   Sexual activity: Not Currently  Other Topics Concern   Not on file  Social History Narrative   Not on file   Social Drivers of Health    Financial Resource Strain: Medium Risk (05/18/2023)   Overall Financial Resource Strain (CARDIA)    Difficulty of Paying Living Expenses: Somewhat hard  Food Insecurity: Food Insecurity Present (05/18/2023)   Hunger Vital Sign    Worried About Running Out of Food in the Last Year: Sometimes true    Ran Out of Food in the Last Year: Sometimes true  Transportation Needs: Unmet Transportation Needs (05/18/2023)   PRAPARE - Transportation    Lack of Transportation (Medical): No    Lack of Transportation (Non-Medical): Yes  Physical Activity: Sufficiently Active (05/18/2023)   Exercise Vital Sign    Days of Exercise per Week: 4 days    Minutes of Exercise per Session: 60 min  Stress: Stress Concern Present (05/18/2023)   [employer] of Occupational Health - Occupational Stress Questionnaire    Feeling of Stress : Very much  Social Connections: Moderately Isolated (05/18/2023)   Social Connection and Isolation Panel [NHANES]    Frequency of Communication with Friends and Family: More than three times a week    Frequency of Social Gatherings with Friends and Family: Never    Attends Religious Services: Never    Database administrator or Organizations: Yes    Attends Engineer, structural: More than 4 times per year    Marital Status: Never married    Allergies:  Allergies  Allergen Reactions   Penicillins Anaphylaxis and Hives   Valproic Acid Other (See Comments)    "my head turns involuntarily to the right"  Neck rigidity    Haloperidol Lactate     tardive dyskinesia    Current Medications: Current Outpatient Medications  Medication Sig Dispense Refill   acetaminophen (TYLENOL) 325 MG tablet Take 650 mg by mouth every 6 (six) hours as needed for moderate pain.     aspirin EC 81 MG tablet Take 81 mg by mouth daily. Swallow whole.     atorvastatin (LIPITOR) 20 MG tablet Take 1 tablet (20 mg total) by mouth daily. 90 tablet 3   chlorthalidone (HYGROTON) 25 MG tablet Take  0.5 tablets (12.5 mg total) by mouth daily. 15 tablet 2   gabapentin (NEURONTIN) 400 MG capsule Take 1 capsule (400 mg total) by mouth 3 (three) times daily as needed. (Patient taking differently:  Take 400 mg by mouth 3 (three) times daily.) 270 capsule 0   hydrOXYzine (ATARAX) 50 MG tablet Take 1 tablet (50 mg total) by mouth 3 (three) times daily as needed. 90 tablet 0   ibuprofen (ADVIL) 200 MG tablet Take 600-800 mg by mouth every 6 (six) hours as needed for moderate pain.     lisinopril (ZESTRIL) 20 MG tablet TAKE 1 TABLET BY MOUTH EVERY DAY 90 tablet 0   methadone (DOLOPHINE) 10 MG/ML solution Take 140 mg by mouth daily.     ondansetron (ZOFRAN-ODT) 4 MG disintegrating tablet Take 1 tablet (4 mg total) by mouth every 8 (eight) hours as needed for nausea or vomiting. 10 tablet 0   OVER THE COUNTER MEDICATION Take 1 tablet by mouth daily. Vitadone multi-vitamin     QUEtiapine (SEROQUEL) 50 MG tablet Take 1 tablet (50 mg total) by mouth at bedtime. 30 tablet 1   sertraline (ZOLOFT) 100 MG tablet Take 1 tablet (100 mg total) by mouth daily. 30 tablet 0   No current facility-administered medications for this visit.    ROS: Review of Systems   Objective:  Psychiatric Specialty Exam: Blood pressure 112/60.There is no height or weight on file to calculate BMI.  General Appearance: Casual  Eye Contact:  Fair  Speech:  Clear and Coherent and Normal Rate  Volume:  Normal  Mood:  Anxious and Depressed  Affect:  Labile and Tearful  Thought Content: Logical   Suicidal Thoughts:  No  Homicidal Thoughts:  No  Thought Process:  Coherent, Goal Directed, and Linear  Orientation:  Full (Time, Place, and Person)  Judgment:  Intact  Insight:  Fair  Concentration:  Concentration: Fair  Fund of Knowledge: Fair  Language: Fair  Psychomotor Activity:  Normal  Akathisia:  No  AIMS (if indicated): not done  Assets:  Manufacturing systems engineer Desire for Improvement Financial  Resources/Insurance Housing Leisure Time Physical Health Resilience Social Support  ADL's:  Intact  Cognition: WNL    PE: General: well-appearing; no acute distress  Pulm: no increased work of breathing on room air  Strength & Muscle Tone: within normal limits Neuro: no focal neurological deficits observed  Gait & Station: normal    Screenings: AIMS    Flowsheet Row [facility]) [facility]  AIMS Total Score 0 0      AUDIT    Flowsheet Row [facility]) [facility] Ad[facility] ([facility]) [facility] Ad[facility] ([facility]) [facility]  Alcohol Use Disorder Identification Test Final Score (AUDIT) 8 30 31       GAD-7    Flowsheet Row [facility] Visit from [facility]/18/2023 in [facility] Video Visit from [facility]/21/2022 [facility] Office Visit from 06[facility]14/2022 in [facility] [employer] Visit from 04/15/2022 in [facility] [employer] [facility] [place]  Total GAD-7 Score 19 9 7 12       [employer]    Flowsheet Row Office Visit from 05/18/2023 in [facility] Office Visit from 09/13/2022 in [facility] [place] Video Visit [facility] Office Visit from 06[facility]14/2022 in [facility] [employer] Visit from 04/15/2022 in [facility]  [place] Total Score 6 0 1 1 2   PHQ-9 Total Score 25 -- 6 5 5       Flowsheet Row Office Visit from 05/18/2023 in [facility] ED from 05/15/2023 in  [facility] ED from 05/19/2022 in [facility] RISK CATEGORY Low Risk No Risk No Risk       Park Pope, MD 06/27/2023, 1:14 PM

## 2023-06-27 ENCOUNTER — Ambulatory Visit (HOSPITAL_COMMUNITY): Payer: MEDICAID | Admitting: Student

## 2023-06-27 ENCOUNTER — Telehealth: Payer: Self-pay | Admitting: *Deleted

## 2023-06-27 VITALS — BP 112/60

## 2023-06-27 DIAGNOSIS — F419 Anxiety disorder, unspecified: Secondary | ICD-10-CM | POA: Diagnosis not present

## 2023-06-27 DIAGNOSIS — F32A Depression, unspecified: Secondary | ICD-10-CM

## 2023-06-27 DIAGNOSIS — I10 Essential (primary) hypertension: Secondary | ICD-10-CM

## 2023-06-27 DIAGNOSIS — F4310 Post-traumatic stress disorder, unspecified: Secondary | ICD-10-CM | POA: Diagnosis not present

## 2023-06-27 DIAGNOSIS — E785 Hyperlipidemia, unspecified: Secondary | ICD-10-CM

## 2023-06-27 DIAGNOSIS — Z0000 Encounter for general adult medical examination without abnormal findings: Secondary | ICD-10-CM

## 2023-06-27 DIAGNOSIS — F332 Major depressive disorder, recurrent severe without psychotic features: Secondary | ICD-10-CM

## 2023-06-27 MED ORDER — HYDROXYZINE HCL 50 MG PO TABS: 50.0000 mg | ORAL_TABLET | Freq: Three times a day (TID) | ORAL | 0 refills | Status: DC | PRN

## 2023-06-27 MED ORDER — SERTRALINE HCL 100 MG PO TABS: 100.0000 mg | ORAL_TABLET | Freq: Every day | ORAL | 0 refills | Status: AC

## 2023-06-27 MED ORDER — QUETIAPINE FUMARATE 50 MG PO TABS: 50.0000 mg | ORAL_TABLET | Freq: Every day | ORAL | 1 refills | Status: AC

## 2023-06-27 NOTE — Patient Instructions (Signed)
You have been referred to this clinic to establish care [facility] [address] [place],  [place]  [address]  Main: [phone] Fax: [phone]  Sunday:Closed Monday:8:30 AM - 5:30 PM Tuesday:8:30 AM - 5:30 PM Wednesday:8:30 AM - 5:30 PM Thursday:8:30 AM - 5:30 PM Friday:8:30 AM - 5:30 PM Saturday:Closed Closed: [place] Day, Good Friday, Memorial Day, July 4th, Labor Day, Thanksgiving Day, Christmas Day

## 2023-06-27 NOTE — Progress Notes (Signed)
Complex Care Management Note  Care Guide Note 06/27/2023 Name: Fitzpatrick Alberico MRN: [id number] DOB: [birth year]  Orin Eberwein is a 48 y.o. year old male who sees Allwardt, Crist Infante, PA-C for primary care. I reached out to [name] by phone today to offer complex care management services.  Mr. Perlmutter was given information about Complex Care Management services today including:   The Complex Care Management services include support from the care team which includes your Nurse Care Manager, Clinical Social Worker, or Pharmacist.  The Complex Care Management team is here to help remove barriers to the health concerns and goals most important to you. Complex Care Management services are voluntary, and the patient may decline or stop services at any time by request to their care team member.   Complex Care Management Consent Status: Patient agreed to services and verbal consent obtained.   Follow up plan:  Telephone appointment with complex care management team member scheduled for:  06/28/2023  Encounter Outcome:  Patient Scheduled  Burman Nieves, CMA, Care Guide [facility]  [facility], [facility] Guide Direct Dial: [phone]  Fax: [phone] Website: [email or web address]

## 2023-06-28 ENCOUNTER — Encounter: Payer: Self-pay | Admitting: Licensed Clinical Social Worker

## 2023-06-30 ENCOUNTER — Ambulatory Visit (HOSPITAL_COMMUNITY)
Admission: EM | Admit: 2023-06-30 | Discharge: 2023-06-30 | Disposition: A | Discharge status: Home / Self Care | Payer: MEDICAID | Attending: Family Medicine | Admitting: Family Medicine

## 2023-06-30 ENCOUNTER — Encounter (HOSPITAL_COMMUNITY): Payer: Self-pay

## 2023-06-30 ENCOUNTER — Ambulatory Visit: Payer: Self-pay | Admitting: Licensed Clinical Social Worker

## 2023-06-30 DIAGNOSIS — M5430 Sciatica, unspecified side: Secondary | ICD-10-CM

## 2023-06-30 DIAGNOSIS — I10 Essential (primary) hypertension: Secondary | ICD-10-CM | POA: Diagnosis not present

## 2023-06-30 MED ORDER — GABAPENTIN 400 MG PO CAPS: 400.0000 mg | ORAL_CAPSULE | Freq: Three times a day (TID) | ORAL | 2 refills | Status: DC

## 2023-06-30 MED ORDER — LISINOPRIL 20 MG PO TABS: 20.0000 mg | ORAL_TABLET | Freq: Two times a day (BID) | ORAL | 2 refills | Status: DC

## 2023-06-30 NOTE — ED Notes (Signed)
Patient called again regarding discharge paperwork that was not his. Patient stated that he "threw it away."

## 2023-06-30 NOTE — Discharge Instructions (Signed)
Continue gabapentin 400 mg--1 capsule 3 times daily for nerve pain and sciatica  Lisinopril 20 mg--I have sent it in for taking it twice daily as you reported in our visit today.  Of note, it was prescribed for once daily previously.  You can use the QR code/website at the back of the summary paperwork to schedule yourself a new patient appointment with primary care

## 2023-06-30 NOTE — ED Triage Notes (Signed)
Pt needs a refill of his Gabapentin and Lisinopril.

## 2023-06-30 NOTE — Patient Instructions (Signed)
Visit Information  Thank you for taking time to visit with me today. Please don't hesitate to contact me if I can be of assistance to you.   Following are the goals we discussed today:   Goals Addressed             This Visit's Progress    Obtain Supportive Resources-Housing   On track    Activities and task to complete in order to accomplish goals.   Keep all upcoming appointments discussed today Continue with compliance of taking medication prescribed by Doctor Implement healthy coping skills discussed to assist with management of symptoms         Our next appointment is by telephone on 2/28 at 3 PM  Please call the care guide team at [phone] if you need to cancel or reschedule your appointment.   If you are experiencing a Mental Health or Behavioral Health Crisis or need someone to talk to, please call the Suicide and Crisis Lifeline: 988 call 911   Patient verbalizes understanding of instructions and care plan provided today and agrees to view in MyChart. Active MyChart status and patient understanding of how to access instructions and care plan via MyChart confirmed with patient.     [facility]  [facility], [facility] Clinical Social Worker Direct Dial: [phone]  Fax: [phone] Website: [email or web address] 4:01 PM

## 2023-06-30 NOTE — ED Provider Notes (Signed)
[facility]    CSN: [id number] Arrival date & time: 06/30/23  1139      History   Chief Complaint Chief Complaint  Patient presents with   Medication Refill    HPI Todd [name] is a 48 y.o. male.    Medication Refill Here for hypertension and for sciatica.  Takes gabapentin 400 mg 3 times daily and it does help the sciatica.  He takes lisinopril twice daily and his blood pressure today is very good.  No chest pain or headache or edema.  He needs to change primary care providers since the 1 he has now is too far away.  He is allergic to penicillin, valproic acid, and alcohol.  Past Medical History:  Diagnosis Date   Anxiety    Cocaine dependence (HCC) 02/24/2012   Depression    GERD (gastroesophageal reflux disease)    Hepatitis C    2014   Hyperlipidemia    Hypertension    Opiate withdrawal (HCC) 02/24/2012   Pneumonia    Pre-diabetes    PTSD (post-traumatic stress disorder)    Sciatica    Substance abuse (HCC)    IV OPIATES CLEAN FOR 5 MONTHS UPDATED 06/27/22    Patient Active Problem List   Diagnosis Date Noted   Preop examination 06/15/2022   Prediabetes 04/29/2022   Vaping nicotine dependence, non-tobacco product 04/29/2022   Mixed hyperlipidemia 04/29/2022   Chronic hepatitis (HCC) 04/19/2022   Anxiety and depression 04/15/2022   Encounter for annual physical exam 04/15/2022   Acute psychosis (HCC) 03/04/2019   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   Alcohol use disorder, severe, dependence (HCC) 05/20/2018   MDD (major depressive disorder), recurrent episode, severe (HCC) 04/26/2018   AKI (acute kidney injury) (HCC) 09/01/2016   Rhabdomyolysis 09/01/2016   Essential hypertension 09/01/2016   Left flank pain    Major depressive disorder, 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   Polysubstance (including opioids) dependence with physiological  dependence (HCC) 03/10/2016   Bipolar II disorder, most recent episode depressed with rapid cycling (HCC) 10/30/2015   Depression, major, recurrent, moderate (HCC) 10/27/2015   Substance induced mood disorder (HCC) 02/26/2013   PTSD (post-traumatic stress disorder) 02/26/2013   Polysubstance dependence including opioid type drug, episodic abuse (HCC) 02/24/2013   Alcohol abuse 02/24/2013   Cannabis dependence, continuous (HCC) 02/24/2012    Past Surgical History:  Procedure Laterality Date   KNEE ARTHROSCOPY WITH MEDIAL MENISECTOMY Right 06/28/2022   Procedure: KNEE ARTHROSCOPY WITH MEDIAL MENISECTOMY;  Surgeon: Teryl Lucy, MD;  Location: [facility];  Service: Orthopedics;  Laterality: Right;   MENSICUS Right    06/28/22   MOUTH SURGERY     TESTICLE TORSION REDUCTION     age 12       Home Medications    Prior to Admission medications   Medication Sig Start Date End Date Taking? Authorizing Provider  acetaminophen (TYLENOL) 325 MG tablet Take 650 mg by mouth every 6 (six) hours as needed for moderate pain.   Yes [provider]  aspirin EC 81 MG tablet Take 81 mg by mouth daily. Swallow whole.   Yes [provider]  atorvastatin (LIPITOR) 20 MG tablet Take 1 tablet (20 mg total) by mouth daily. 09/21/22  Yes Allwardt, Alyssa M, PA-C  chlorthalidone (HYGROTON) 25 MG tablet Take 0.5 tablets (12.5 mg total) by mouth daily. 02/10/23  Yes Allwardt, Alyssa M, PA-C  hydrOXYzine (ATARAX) 50 MG tablet Take 1 tablet (50  mg total) by mouth 3 (three) times daily as needed. 06/27/23  Yes Park Pope, MD  methadone (DOLOPHINE) 10 MG/ML solution Take 140 mg by mouth daily.   Yes [provider]  propranolol (INDERAL) 10 MG tablet Take by mouth. 09/04/17  Yes [provider]  QUEtiapine (SEROQUEL) 50 MG tablet Take 1 tablet (50 mg total) by mouth at bedtime. 06/27/23 08/26/23 Yes Park Pope, MD  sertraline (ZOLOFT) 100 MG tablet Take 1 tablet (100 mg total) by mouth  daily. 06/27/23 07/27/23 Yes Park Pope, MD  gabapentin (NEURONTIN) 400 MG capsule Take 1 capsule (400 mg total) by mouth 3 (three) times daily. 06/30/23 09/28/23  Zenia Resides, MD  ibuprofen (ADVIL) 200 MG tablet Take 600-800 mg by mouth every 6 (six) hours as needed for moderate pain.    [provider]  lisinopril (ZESTRIL) 20 MG tablet Take 1 tablet (20 mg total) by mouth in the morning and at bedtime. 06/30/23   Zenia Resides, MD  ondansetron (ZOFRAN-ODT) 4 MG disintegrating tablet Take 1 tablet (4 mg total) by mouth every 8 (eight) hours as needed for nausea or vomiting. 06/28/22   Janine Ores K, PA-C  OVER THE COUNTER MEDICATION Take 1 tablet by mouth daily. Vitadone multi-vitamin    [provider]    Family History Family History  Problem Relation Age of Onset   Drug abuse Mother    Melanoma Mother    COPD Father    Hypertension Father    Diabetes Mellitus II Father    Heart disease Father    Depression Father    Anxiety disorder Father    Colon polyps Paternal Uncle    Colon cancer Neg Hx    Crohn's disease Neg Hx    Esophageal cancer Neg Hx    Rectal cancer Neg Hx    Stomach cancer Neg Hx    Ulcerative colitis Neg Hx     Social History Social History   Tobacco Use   Smoking status: Former    Current packs/day: 0.00    Types: Cigarettes    Quit date: 03/08/2021    Years since quitting: 2.3   Smokeless tobacco: Never   Tobacco comments:    Pt declines teaching  Vaping Use   Vaping status: Some Days   Substances: Nicotine, Flavoring  Substance Use Topics   Alcohol use: Not Currently   Drug use: Not Currently    Types: IV, Heroin, Cocaine, Marijuana, Methamphetamines    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)     Allergies   Penicillins, Valproic acid, and Haloperidol lactate   Review of Systems Review of Systems   Physical Exam Triage Vital Signs ED Triage Vitals  Encounter Vitals Group     BP 06/30/23  1217 110/73     Systolic BP Percentile --      Diastolic BP Percentile --      Pulse Rate 06/30/23 1217 80     Resp 06/30/23 1217 18     Temp 06/30/23 1217 98.9 F (37.2 C)     Temp Source 06/30/23 1217 Oral     SpO2 06/30/23 1217 96 %     Weight --      Height --      Head Circumference --      Peak Flow --      Pain Score 06/30/23 1219 0     Pain Loc --      Pain Education --  Exclude from Growth Chart --    No data found.  Updated Vital Signs BP 110/73 (BP Location: Right Arm)   Pulse 80   Temp 98.9 F (37.2 C) (Oral)   Resp 18   SpO2 96%   Visual Acuity Right Eye Distance:   Left Eye Distance:   Bilateral Distance:    Right Eye Near:   Left Eye Near:    Bilateral Near:     Physical Exam Vitals reviewed.  Constitutional:      General: He is not in acute distress.    Appearance: He is not ill-appearing, toxic-appearing or diaphoretic.  HENT:     Mouth/Throat:     Mouth: Mucous membranes are moist.  Eyes:     Extraocular Movements: Extraocular movements intact.     Conjunctiva/sclera: Conjunctivae normal.     Pupils: Pupils are equal, round, and reactive to light.  Cardiovascular:     Rate and Rhythm: Normal rate and regular rhythm.     Heart sounds: No murmur heard. Pulmonary:     Effort: Pulmonary effort is normal.     Breath sounds: Normal breath sounds.  Musculoskeletal:     Cervical back: Neck supple.  Lymphadenopathy:     Cervical: No cervical adenopathy.  Skin:    Coloration: Skin is not pale.  Neurological:     General: No focal deficit present.     Mental Status: He is alert and oriented to person, place, and time.  Psychiatric:        Behavior: Behavior normal.      UC Treatments / Results  Labs (all labs ordered are listed, but only abnormal results are displayed) Labs Reviewed - No data to display  EKG   Radiology No results found.  Procedures Procedures (including critical care time)  Medications Ordered in  UC Medications - No data to display  Initial Impression / Assessment and Plan / UC Course  I have reviewed the triage vital signs and the nursing notes.  Pertinent labs & imaging results that were available during my care of the patient were reviewed by me and considered in my medical decision making (see chart for details).     Gabapentin is filled for a 1 month supply +2 refills and lisinopril is done the same.  Of note he reported to me that he takes his lisinopril 2 times daily, but it looks like it was only prescribed once daily.  Staff will help him at discharge to make a new primary care appointment. Final Clinical Impressions(s) / UC Diagnoses   Final diagnoses:  Sciatica, unspecified laterality  Essential hypertension, benign     Discharge Instructions      Continue gabapentin 400 mg--1 capsule 3 times daily for nerve pain and sciatica  Lisinopril 20 mg--I have sent it in for taking it twice daily as you reported in our visit today.  Of note, it was prescribed for once daily previously.  You can use the QR code/website at the back of the summary paperwork to schedule yourself a new patient appointment with primary care      ED Prescriptions     Medication Sig Dispense Auth. Provider   gabapentin (NEURONTIN) 400 MG capsule Take 1 capsule (400 mg total) by mouth 3 (three) times daily. 90 capsule Caramia Boutin, Janace Aris, MD   lisinopril (ZESTRIL) 20 MG tablet  (Status: Discontinued) Take 1 tablet (20 mg total) by mouth in the morning and at bedtime. 60 tablet Monterrius Cardosa, Janace Aris, MD  lisinopril (ZESTRIL) 20 MG tablet Take 1 tablet (20 mg total) by mouth in the morning and at bedtime. 60 tablet Laray Corbit, Janace Aris, MD      I have reviewed the PDMP during this encounter.   Zenia Resides, MD 06/30/23 1254

## 2023-06-30 NOTE — Patient Outreach (Signed)
  Care Coordination   Initial Visit Note   06/30/2023 Name: Todd [name] MRN: [id number] DOB: 15-Dec-1975  Todd [name] is a 48 y.o. year old male who sees Allwardt, Crist Infante, PA-C for primary care. I spoke with  Todd [name] by phone today.  What matters to the patients health and wellness today?  Support Resources, Housing    Goals Addressed             This Visit's Progress    Obtain Supportive Resources-Housing   On track    Activities and task to complete in order to accomplish goals.   Keep all upcoming appointments discussed today Continue with compliance of taking medication prescribed by Doctor Implement healthy coping skills discussed to assist with management of symptoms         SDOH assessments and interventions completed:  No     Care Coordination Interventions:  Yes, provided  Interventions Today    Flowsheet Row Most Recent Value  Chronic Disease   Chronic disease during today's visit Other  [Housing Instability, MDD, PTSD, Bipolar II Disorder]  General Interventions   General Interventions Discussed/Reviewed General Interventions Discussed, [facility], Doctor Visits  [LCSW discussed local housing resources to assist with housing instability. Pt has contacted [facility] and will f/up with them every 2 weeks]  Doctor Visits Discussed/Reviewed Doctor Visits Discussed  Mental Health Interventions   Mental Health Discussed/Reviewed Mental Health Discussed, Coping Strategies, Anxiety, Depression  [Pt participates in med management with psychiatrist. Has limited support in community]  Nutrition Interventions   Nutrition Discussed/Reviewed Nutrition Discussed  Pharmacy Interventions   Pharmacy Dicussed/Reviewed Pharmacy Topics Discussed, Medication Adherence  [Pt identified barriers with current pharmacy.]  Safety Interventions   Safety Discussed/Reviewed Safety Discussed       Follow up plan: Follow up call scheduled for  2-3 weeks    Encounter Outcome:  Patient Visit Completed   Jenel Lucks, LCSW [facility], [facility] Clinical Social Worker Direct Dial: [phone]  Fax: [phone] Website: [email or web address] 4:00 PM

## 2023-07-10 ENCOUNTER — Other Ambulatory Visit (INDEPENDENT_AMBULATORY_CARE_PROVIDER_SITE_OTHER): Payer: MEDICAID

## 2023-07-10 DIAGNOSIS — Z79899 Other long term (current) drug therapy: Secondary | ICD-10-CM | POA: Diagnosis not present

## 2023-07-10 DIAGNOSIS — F332 Major depressive disorder, recurrent severe without psychotic features: Secondary | ICD-10-CM | POA: Diagnosis not present

## 2023-07-10 DIAGNOSIS — Z0000 Encounter for general adult medical examination without abnormal findings: Secondary | ICD-10-CM

## 2023-07-10 DIAGNOSIS — I10 Essential (primary) hypertension: Secondary | ICD-10-CM

## 2023-07-10 NOTE — Progress Notes (Signed)
 Pt Tolerated venipuncture well with no complaints besides that when being cold its hard to get Pts blood he states and that there is a lot of scar tissue. Told pt that would print out labs incase [facility] says it is a QNS.

## 2023-07-11 ENCOUNTER — Encounter (HOSPITAL_COMMUNITY): Payer: Self-pay

## 2023-07-11 ENCOUNTER — Ambulatory Visit (INDEPENDENT_AMBULATORY_CARE_PROVIDER_SITE_OTHER): Payer: MEDICAID | Admitting: Mental Health

## 2023-07-11 DIAGNOSIS — F332 Major depressive disorder, recurrent severe without psychotic features: Secondary | ICD-10-CM | POA: Diagnosis not present

## 2023-07-11 DIAGNOSIS — F4310 Post-traumatic stress disorder, unspecified: Secondary | ICD-10-CM

## 2023-07-11 NOTE — Progress Notes (Unsigned)
   THERAPIST PROGRESS NOTE  Session Time: 3:04 PM ( 50 MINUTES)  Participation Level: Active  Behavioral Response: CasualAlertDysphoric  Type of Therapy: Individual Therapy  Treatment Goals addressed: STG: [name] will increase stability in community AEB connection to needed resources in community with following up as needed per self report within the next 90 days    STG: [name] will increase level of functioning AEB development of x 3 effective coping skills for moods with ability to process thoughts in balanced manner within the next 90 days.   ProgressTowards Goals: Initial  Interventions: Strength-based and Supportive  Summary: Carsen Leaf is a 48 y.o. male who presents with dx of Major depression recurrent severe and PTSD. Presents for session alert and oriented;mood and affect adequate. Speech clear and coherent; thought process logical. Shares improvement in sxs secondary to medications " I am doing better. Still stressed though." Notes to no longer reside in sober living home and to be living with cousin. Shares no longer working and has had difficulty finding employment. Shares episodes of disagreements with cousin with him having to ask for financial help. Notes to continue to attend methadone clinic and now able to take advantage of 'take homes'. Shares desire to reconcile with previous girlfriend but shares she is hesitant, " I understand."  Notes difficulty wit communication at times and working to not listen to respond. Shares to have also found out to have long lost daughter and for this relationship to be going well. Explored treatment plan with therapist and hares would like to increase independence and stability in community obtaining own home, driver's license, car and steady employment. Notes low motivation and can become overwhelmed. Shares will attempt to present to gym with cousin. Receptive of referral to IPS services and [employer] groups. Agrees to treatment plan and  homework of identifying x 1 goal daily. , denies safety concerns.    Suicidal/Homicidal: Nowithout intent/plan  Therapist Response: Therapist engaged [name] in therapy session, completed check in and assessed for current level of functioning, sxs management and level of stressors. Reviewed intake assessment and engaged in rapport building. Reviewed informed consent and bounds of confidentiality. Explored current natural supports and ability to maintain sobriety.  Provided safe space to share thoughts and feelings, active empathic listening. Engaged in treatment plan and areas of progress [name] would like to see. Educated on IPS services and placed referral. Supported in processing thoughts of relationship with ex and engaging with new found daughter. Reviewed session and provided follow up.   Plan: Return again in  x 6 weeks.  Diagnosis: Major depressive disorder, recurrent severe without psychotic features (HCC)  PTSD (post-traumatic stress disorder)  Collaboration of Care: Other IPS  and [employer]- groups   Patient/Guardian was advised Release of Information must be obtained prior to any record release in order to collaborate their care with an outside provider. Patient/Guardian was advised if they have not already done so to contact the registration department to sign all necessary forms in order for [place] to release information regarding their care.   Consent: Patient/Guardian gives verbal consent for treatment and assignment of benefits for services provided during this visit. Patient/Guardian expressed understanding and agreed to proceed.   Stephan Minister [place], [facility] 07/11/2023

## 2023-07-12 LAB — CBC WITH DIFFERENTIAL/PLATELET
Basophils Absolute: 0.1 10*3/uL (ref 0.0–0.2)
Basos: 1 %
EOS (ABSOLUTE): 0.4 10*3/uL (ref 0.0–0.4)
Eos: 6 %
Hematocrit: 43.9 % (ref 37.5–51.0)
Hemoglobin: 14.1 g/dL (ref 13.0–17.7)
Immature Grans (Abs): 0 10*3/uL (ref 0.0–0.1)
Immature Granulocytes: 0 %
Lymphocytes Absolute: 2 10*3/uL (ref 0.7–3.1)
Lymphs: 30 %
MCH: 28.3 pg (ref 26.6–33.0)
MCHC: 32.1 g/dL (ref 31.5–35.7)
MCV: 88 fL (ref 79–97)
Monocytes Absolute: 0.3 10*3/uL (ref 0.1–0.9)
Monocytes: 5 %
Neutrophils Absolute: 3.7 10*3/uL (ref 1.4–7.0)
Neutrophils: 58 %
Platelets: 300 10*3/uL (ref 150–450)
RBC: 4.98 x10E6/uL (ref 4.14–5.80)
RDW: 12.3 % (ref 11.6–15.4)
WBC: 6.4 10*3/uL (ref 3.4–10.8)

## 2023-07-12 LAB — TSH+FREE T4
Free T4: 0.8 ng/dL — ABNORMAL LOW (ref 0.82–1.77)
TSH: 1.68 u[IU]/mL (ref 0.450–4.500)

## 2023-07-12 LAB — COMPREHENSIVE METABOLIC PANEL
ALT: 21 IU/L (ref 0–44)
AST: 28 IU/L (ref 0–40)
Albumin: 4.3 g/dL (ref 4.1–5.1)
Alkaline Phosphatase: 101 IU/L (ref 44–121)
BUN/Creatinine Ratio: 11 (ref 9–20)
BUN: 13 mg/dL (ref 6–24)
Bilirubin Total: 0.2 mg/dL (ref 0.0–1.2)
CO2: 17 mmol/L — ABNORMAL LOW (ref 20–29)
Calcium: 9.5 mg/dL (ref 8.7–10.2)
Chloride: 103 mmol/L (ref 96–106)
Creatinine, Ser: 1.19 mg/dL (ref 0.76–1.27)
Globulin, Total: 2.8 g/dL (ref 1.5–4.5)
Glucose: 112 mg/dL — ABNORMAL HIGH (ref 70–99)
Potassium: 4.7 mmol/L (ref 3.5–5.2)
Sodium: 140 mmol/L (ref 134–144)
Total Protein: 7.1 g/dL (ref 6.0–8.5)
eGFR: 76 mL/min/{1.73_m2} (ref >59)

## 2023-07-12 LAB — VITAMIN D 25 HYDROXY (VIT D DEFICIENCY, FRACTURES)

## 2023-07-12 LAB — HEMOGLOBIN A1C

## 2023-07-14 ENCOUNTER — Ambulatory Visit: Payer: Self-pay | Admitting: Licensed Clinical Social Worker

## 2023-07-14 NOTE — Patient Outreach (Signed)
 Care Coordination   Follow Up Visit Note   07/14/2023 Name: Todd [name] MRN: [id number] DOB: 1976-04-07  Todd [name] is a 48 y.o. year old male who sees Allwardt, Crist Infante, PA-C for primary care. I spoke with  Todd [name] by phone today.  What matters to the patients health and wellness today?  Symptom Management, Community Resources for Housing    Goals Addressed             This Visit's Progress    Obtain Supportive Resources-Housing   On track    Activities and task to complete in order to accomplish goals.   Keep all upcoming appointments discussed today Continue with compliance of taking medication prescribed by Doctor Implement healthy coping skills discussed to assist with management of symptoms Continue f/up with [facility] by calling every 1-2 weeks (per agency)         SDOH assessments and interventions completed:  No     Care Coordination Interventions:  Yes, provided  Interventions Today    Flowsheet Row Most Recent Value  Chronic Disease   Chronic disease during today's visit Other  [Housing Instability, MDD, PTSD, Bipolar II Disorder]  General Interventions   General Interventions Discussed/Reviewed General Interventions Reviewed, Community Resources, Doctor Visits  Doctor Visits Discussed/Reviewed Doctor Visits Reviewed  Mental Health Interventions   Mental Health Discussed/Reviewed Mental Health Reviewed, Coping Strategies  [Pt is participating in counseling, states counselor may refer him to a CM to strengthen support with SDOH. LCSW will review note to ensure no duplication of services.]       Follow up plan: Follow up call scheduled for 4-6 weeks    Encounter Outcome:  Patient Visit Completed   Jenel Lucks, LCSW [place]  [facility], [facility] Clinical Social Worker Direct Dial: [phone]  Fax: [phone] Website: [email or web address] 3:29 PM

## 2023-07-14 NOTE — Patient Instructions (Signed)
 Visit Information  Thank you for taking time to visit with me today. Please don't hesitate to contact me if I can be of assistance to you.   Following are the goals we discussed today:   Goals Addressed             This Visit's Progress    Obtain Supportive Resources-Housing   On track    Activities and task to complete in order to accomplish goals.   Keep all upcoming appointments discussed today Continue with compliance of taking medication prescribed by Doctor Implement healthy coping skills discussed to assist with management of symptoms Continue f/up with [facility] by calling every 1-2 weeks (per agency)         Our next appointment is by telephone on 4/11 at 3 PM  Please call the care guide team at [phone] if you need to cancel or reschedule your appointment.   If you are experiencing a Mental Health or Behavioral Health Crisis or need someone to talk to, please call the Suicide and Crisis Lifeline: 988 call 911   Patient verbalizes understanding of instructions and care plan provided today and agrees to view in MyChart. Active MyChart status and patient understanding of how to access instructions and care plan via MyChart confirmed with patient.     Windy Fast [facility]  [facility], [facility] Clinical Social Worker Direct Dial: [phone]  Fax: [phone] Website: [email or web address] 3:30 PM

## 2023-08-05 NOTE — Progress Notes (Deleted)
 BH MD Outpatient Progress Note  08/05/2023 1:59 PM Todd [name]  MRN:  [id number]  Assessment:  Todd [name] is a 48 y.o. male with a history of anxiety, depression, PTSD, and polysubstance abuse (alcohol, marijuana, cocaine, opiates, and methamphetamines) who is an established patient with [facility] for medication management. He had been previously seen by Dr. Everlena Cooper for management of depression. He also has been on methadone for management of opiate use disorder. Last known psychotropic regiment included quetiapine 300 mg at bedtime, sertraline 100 mg daily, methadone daily, and gabapentin.   Today, 08/05/2023, patient reports significant improvement in symptoms of depression and PTSD.  He denies any safety concerns including passive suicidal ideation.  He denies any acute somatic complaints from initiation of Zoloft and Seroquel.  We reviewed symptom profile to better clarify his diagnosis and patient's symptoms appear more consistent with intermittent explosive disorder rather than bipolar disorder due to lack of significant manic features outside of former substance abuse as well as experiencing anger out of proportion to situation without premeditation.  Will review whether quetiapine is necessary for management of patient's intermittent explosive disorder by decreasing quetiapine by 50 mg and have patient follow-up in 1 month.  Also referred patient to establish care with [facility] wellness due to his current PCP being too far away.  Patient also reported continued to use cannabis at this time so his anxiety and depressive symptoms may not be appropriately treated at this time with his current psychotropic regimen.  Will also set patient up with a lab appointment to obtain metabolic labs   Plan:  # Major Depressive Disorder, recurrent episode # Intermittent Explosive Disorder Past medication trials:  Status of problem:  improving Interventions: -- Continue sertraline 100 mg daily -- Decrease Seroquel to 50 mg nightly  -Metabolic labs: reordered Lipid Panel, A1c, and ECG  -last labs 06/28/22 ECG Qtc 450, 09/19/22 lipid panel wnl, A1c wnl -- psychotherapy  # Post Traumatic Stress Disorder Past medication trials:  Status of problem: active Interventions: -- sertraline as above --Hydroxyzine 50 mg 3 times daily as needed for anxiety -- psychotherapy  # Opiate Use Disorder, in sustained remission Past medication trials:  Interventions: -- has not used since 2023 -- methadone per methadone clinic  #Stimulant Use Disorder, cocaine, in sustained remission #Alcohol Use Disorder, in sustained remission     Return to care in 4 weeks  Patient was given contact information for behavioral health clinic and was instructed to call 911 for emergencies.    Patient and plan of care will be discussed with the Attending MD, Dr. Josephina Shih, who agrees with the above statement and plan.   Subjective:  Chief Complaint: Medication Management   Interval History:  Patient reports dramatic improvement in depression.  He reports no longer experiencing several crying spells every week and he has not had any passive suicidal ideation for a while.  He reports also speaking with sobriety of his family for social support as well as discovering he has a 33 year old daughter due to [email or web address].  He denies SI/HI/AVH.  He reports sleeping and eating well.  He reports he has not had significant lability recently but this is due to having multiple coping skills as well as seeing a counselor for both substance use as well as Cicero Duck here at [facility].  He also reports that he is no longer feeling as hypervigilant as he did before.  He endorsed that he was still using cannabis a few  blunts per day.  He reports that it helped significantly with his anxiety PTSD symptoms.  We discussed the necessity of cannabis use and whether he felt like he  had any dependence on it and he denied feeling he had some form of dependence on it but also admits he has been using it for a very long time.    Visit Diagnosis:  No diagnosis found.    Past Psychiatric History:  Patient has had prior psychiatric hospitalizations in 2014, 2017, and 2019 and several in other locations in the context of MDD and polysubstance use.  He has been connected with psychiatric providers in the past the last 1 was in [place].  Patient reports 2 suicide attempts when he was 19 and 22.  He was the one at 22 was after overdose and he had to be medically cleared before being transferred to the psychiatric hospital.   Patient has taken clonidine, propranolol, Atarax, gabapentin, Ativan, Xanax, methadone, Zoloft, Cymbalta, Prozac, Paxil (irritable), trazodone (priapism), Seroquel, and zolpidem in the past. Haldol (TD), Abilify, Depakote.    Has used cocaine, cannabis, and opiates in the past.  Past Medical History:  Past Medical History:  Diagnosis Date   Anxiety    Cocaine dependence (HCC) 02/24/2012   Depression    GERD (gastroesophageal reflux disease)    Hepatitis C    2014   Hyperlipidemia    Hypertension    Opiate withdrawal (HCC) 02/24/2012   Pneumonia    Pre-diabetes    PTSD (post-traumatic stress disorder)    Sciatica    Substance abuse (HCC)    IV OPIATES CLEAN FOR 5 MONTHS UPDATED 06/27/22    Past Surgical History:  Procedure Laterality Date   KNEE ARTHROSCOPY WITH MEDIAL MENISECTOMY Right 06/28/2022   Procedure: KNEE ARTHROSCOPY WITH MEDIAL MENISECTOMY;  Surgeon: Teryl Lucy, MD;  Location: [facility];  Service: Orthopedics;  Laterality: Right;   MENSICUS Right    06/28/22   MOUTH SURGERY     TESTICLE TORSION REDUCTION     age 21      Family History:  Family History  Problem Relation Age of Onset   Drug abuse Mother    Melanoma Mother    COPD Father    Hypertension Father    Diabetes Mellitus II Father    Heart disease Father     Depression Father    Anxiety disorder Father    Colon polyps Paternal Uncle    Colon cancer Neg Hx    Crohn's disease Neg Hx    Esophageal cancer Neg Hx    Rectal cancer Neg Hx    Stomach cancer Neg Hx    Ulcerative colitis Neg Hx     Social History:   Social History   Socioeconomic History   Marital status: Single    Spouse name: Not on file   Number of children: Not on file   Years of education: Not on file   Highest education level: Not on file  Occupational History   Not on file  Tobacco Use   Smoking status: Former    Current packs/day: 0.00    Types: Cigarettes    Quit date: 03/08/2021    Years since quitting: 2.4   Smokeless tobacco: Never   Tobacco comments:    Pt declines teaching  Vaping Use   Vaping status: Some Days   Substances: Nicotine, Flavoring  Substance and Sexual Activity   Alcohol use: Not Currently   Drug use: Not Currently  Types: IV, Heroin, Cocaine, Marijuana, Methamphetamines    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)   Sexual activity: Not Currently  Other Topics Concern   Not on file  Social History Narrative   Not on file   Social Drivers of Health   Financial Resource Strain: Medium Risk (05/18/2023)   Overall Financial Resource Strain (CARDIA)    Difficulty of Paying Living Expenses: Somewhat hard  Food Insecurity: Food Insecurity Present (05/18/2023)   Hunger Vital Sign    Worried About Running Out of Food in the Last Year: Sometimes true    Ran Out of Food in the Last Year: Sometimes true  Transportation Needs: Unmet Transportation Needs (05/18/2023)   PRAPARE - Transportation    Lack of Transportation (Medical): No    Lack of Transportation (Non-Medical): Yes  Physical Activity: Sufficiently Active (05/18/2023)   Exercise Vital Sign    Days of Exercise per Week: 4 days    Minutes of Exercise per Session: 60 min  Stress: Stress Concern Present (05/18/2023)   [employer] of Occupational Health -  Occupational Stress Questionnaire    Feeling of Stress : Very much  Social Connections: Moderately Isolated (05/18/2023)   Social Connection and Isolation Panel [NHANES]    Frequency of Communication with Friends and Family: More than three times a week    Frequency of Social Gatherings with Friends and Family: Never    Attends Religious Services: Never    Database administrator or Organizations: Yes    Attends Engineer, structural: More than 4 times per year    Marital Status: Never married    Allergies:  Allergies  Allergen Reactions   Penicillins Anaphylaxis and Hives   Valproic Acid Other (See Comments)    "my head turns involuntarily to the right"  Neck rigidity    Haloperidol Lactate     tardive dyskinesia    Current Medications: Current Outpatient Medications  Medication Sig Dispense Refill   acetaminophen (TYLENOL) 325 MG tablet Take 650 mg by mouth every 6 (six) hours as needed for moderate pain.     aspirin EC 81 MG tablet Take 81 mg by mouth daily. Swallow whole.     atorvastatin (LIPITOR) 20 MG tablet Take 1 tablet (20 mg total) by mouth daily. 90 tablet 3   chlorthalidone (HYGROTON) 25 MG tablet Take 0.5 tablets (12.5 mg total) by mouth daily. 15 tablet 2   gabapentin (NEURONTIN) 400 MG capsule Take 1 capsule (400 mg total) by mouth 3 (three) times daily. 90 capsule 2   hydrOXYzine (ATARAX) 50 MG tablet Take 1 tablet (50 mg total) by mouth 3 (three) times daily as needed. 90 tablet 0   ibuprofen (ADVIL) 200 MG tablet Take 600-800 mg by mouth every 6 (six) hours as needed for moderate pain.     lisinopril (ZESTRIL) 20 MG tablet Take 1 tablet (20 mg total) by mouth in the morning and at bedtime. 60 tablet 2   methadone (DOLOPHINE) 10 MG/ML solution Take 140 mg by mouth daily.     ondansetron (ZOFRAN-ODT) 4 MG disintegrating tablet Take 1 tablet (4 mg total) by mouth every 8 (eight) hours as needed for nausea or vomiting. 10 tablet 0   OVER THE COUNTER  MEDICATION Take 1 tablet by mouth daily. Vitadone multi-vitamin     propranolol (INDERAL) 10 MG tablet Take by mouth.     QUEtiapine (SEROQUEL) 50 MG tablet Take 1 tablet (50 mg total) by mouth at bedtime.  30 tablet 1   sertraline (ZOLOFT) 100 MG tablet Take 1 tablet (100 mg total) by mouth daily. 30 tablet 0   No current facility-administered medications for this visit.    ROS: Review of Systems   Objective:  Psychiatric Specialty Exam: There were no vitals taken for this visit.There is no height or weight on file to calculate BMI.  General Appearance: Casual  Eye Contact:  Fair  Speech:  Clear and Coherent and Normal Rate  Volume:  Normal  Mood:  Anxious and Depressed  Affect:  Labile and Tearful  Thought Content: Logical   Suicidal Thoughts:  No  Homicidal Thoughts:  No  Thought Process:  Coherent, Goal Directed, and Linear  Orientation:  Full (Time, Place, and Person)  Judgment:  Intact  Insight:  Fair  Concentration:  Concentration: Fair  Fund of Knowledge: Fair  Language: Fair  Psychomotor Activity:  Normal  Akathisia:  No  AIMS (if indicated): not done  Assets:  Manufacturing systems engineer Desire for Improvement Financial Resources/Insurance Housing Leisure Time Physical Health Resilience Social Support  ADL's:  Intact  Cognition: WNL    PE: General: well-appearing; no acute distress  Pulm: no increased work of breathing on room air  Strength & Muscle Tone: within normal limits Neuro: no focal neurological deficits observed  Gait & Station: normal    Screenings: AIMS    Flowsheet Row [facility]) [facility]  AIMS Total Score 0 0      AUDIT    Flowsheet Row [facility]) [facility] ([facility]  Alcohol Use Disorder Identification Test Final Score (AUDIT) 8 30 31       GAD-7    Flowsheet Row Office Visit from 05/18/2023 in [facility] Video Visit from [facility]/21[facility]2022 [facility] Office Visit from 06/14/2022 in [facility] [employer] Visit from 04/15/2022 in [facility] [place]  Total GAD-7 Score 19 9 7 12       [employer]    Flowsheet Row Office Visit from 05/18/2023 in [facility] Office Visit from 09[facility]13/2022 in [facility] [place] Video Visit from [facility] Visit from 06/14/2022 in [facility] [employer] Visit from 04/15/2022 in [facility] [place]  PHQ-2 Total Score 6 0 1 1 2   PHQ-9 Total Score 25 -- 6 5 5       Flowsheet Row ED from 06/30/2023 in [facility] Visit from 05/18/2023 in [facility] ED from 05/15/2023 in [facility]  C-SSRS RISK CATEGORY No Risk Low Risk No Risk       Park Pope, MD 08/05/2023, 1:59 PM

## 2023-08-08 ENCOUNTER — Encounter (HOSPITAL_COMMUNITY): Payer: MEDICAID | Admitting: Student

## 2023-08-08 ENCOUNTER — Encounter (HOSPITAL_COMMUNITY): Payer: Self-pay

## 2023-08-25 ENCOUNTER — Encounter: Payer: MEDICAID | Admitting: Licensed Clinical Social Worker

## 2023-08-29 ENCOUNTER — Other Ambulatory Visit: Payer: Self-pay | Admitting: Physician Assistant

## 2023-09-04 ENCOUNTER — Other Ambulatory Visit: Payer: MEDICAID | Admitting: Licensed Clinical Social Worker

## 2023-09-04 DIAGNOSIS — F4310 Post-traumatic stress disorder, unspecified: Secondary | ICD-10-CM

## 2023-09-04 DIAGNOSIS — F332 Major depressive disorder, recurrent severe without psychotic features: Secondary | ICD-10-CM

## 2023-09-04 DIAGNOSIS — F3181 Bipolar II disorder: Secondary | ICD-10-CM

## 2023-09-06 ENCOUNTER — Ambulatory Visit (HOSPITAL_COMMUNITY): Payer: MEDICAID | Admitting: Mental Health

## 2023-09-06 NOTE — Patient Outreach (Signed)
 Complex Care Management   Visit Note  09/04/2023  Name:  Todd [name] MRN: [id number] DOB: [birth year]  Situation: Referral received for Complex Care Management related to [facility]/Behavioral Health diagnosis PTSD, Bipolar, GAD, MDD and SDOH Barriers:  Housing Pt is homeless  I obtained verbal consent from Patient.  Visit completed with pt  on the phone  Background:   Past Medical History:  Diagnosis Date   Anxiety    Cocaine dependence (HCC) 02/24/2012   Depression    GERD (gastroesophageal reflux disease)    Hepatitis C    2014   Hyperlipidemia    Hypertension    Opiate withdrawal (HCC) 02/24/2012   Pneumonia    Pre-diabetes    PTSD (post-traumatic stress disorder)    Sciatica    Substance abuse (HCC)    IV OPIATES CLEAN FOR 5 MONTHS UPDATED 06/27/22    Assessment: Patient Reported Symptoms:  Cognitive Cognitive Status: Alert and oriented to person, place, and time, Normal speech and language skills      Neurological Neurological Review of Symptoms: No symptoms reported    HEENT HEENT Symptoms Reported: No symptoms reported      Cardiovascular Cardiovascular Symptoms Reported: No symptoms reported    Respiratory Respiratory Symptoms Reported: No symptoms reported    Endocrine Patient reports the following symptoms related to hypoglycemia or hyperglycemia : No symptoms reported    Gastrointestinal Gastrointestinal Symptoms Reported: No symptoms reported      Genitourinary Genitourinary Symptoms Reported: No symptoms reported    Integumentary Integumentary Symptoms Reported: No symptoms reported    Musculoskeletal Musculoskelatal Symptoms Reviewed: No symptoms reported        Psychosocial Psychosocial Symptoms Reported: No symptoms reported Behavioral Health Conditions: Anxiety, Depression, Bipolar affective disorder, Post traumatic stress Behavioral Management Strategies: Counseling, Medication therapy Major Change/Loss/Stressor/Fears (CP): Medical condition,  self, Resources, Environment Techniques to [place] with Loss/Stress/Change: Counseling, Medication Do you feel physically threatened by others?: No      05/18/2023    9:11 AM  Depression screen PHQ 2/9  Decreased Interest   Down, Depressed, Hopeless   PHQ - 2 Score   Altered sleeping   Tired, decreased energy   Change in appetite   Feeling bad or failure about yourself    Trouble concentrating   Moving slowly or fidgety/restless   Suicidal thoughts   PHQ-9 Score   Difficult doing work/chores      Information is confidential and restricted. Go to Review Flowsheets to unlock data.    There were no vitals filed for this visit.  Medications Reviewed Today     Reviewed by Adriana Albany, LCSW (Social Worker) on 09/06/23 at 1351  Med List Status: <None>   Medication Order Taking? Sig Documenting Provider Last Dose Status Informant  acetaminophen  (TYLENOL ) 325 MG tablet [id number] No Take 650 mg by mouth every 6 (six) hours as needed for moderate pain. [provider] 06/30/2023 Active Self  aspirin EC 81 MG tablet [id number] No Take 81 mg by mouth daily. Swallow whole. [provider] 06/30/2023 Active Self           Med Note Dennice Fishman A   Mon Jun 27, 2022  8:42 AM) On hold for procedure   atorvastatin  (LIPITOR) 20 MG tablet [id number] No Take 1 tablet (20 mg total) by mouth daily. Allwardt, Deleta Felix, PA-C 06/30/2023 Active   chlorthalidone  (HYGROTON ) 25 MG tablet [id number]  TAKE 1/2 TABLET BY MOUTH EVERY DAY Allwardt, Alyssa M, PA-C  Active   gabapentin  (NEURONTIN )  400 MG capsule [id number]  Take 1 capsule (400 mg total) by mouth 3 (three) times daily. Ann Keto, MD  Active   hydrOXYzine  (ATARAX ) 50 MG tablet [id number] No Take 1 tablet (50 mg total) by mouth 3 (three) times daily as needed. Augusta Blizzard, MD Past Month Active   ibuprofen  (ADVIL ) 200 MG tablet [id number] No Take 600-800 mg by mouth every 6 (six) hours as needed for moderate pain. [provider] Taking Active Self  lisinopril  (ZESTRIL ) 20 MG tablet [id number]  Take 1 tablet (20 mg total) by mouth in the morning and at bedtime. Ann Keto, MD  Active   methadone (DOLOPHINE) 10 MG/ML solution [id number] No Take 140 mg by mouth daily. [provider] 06/30/2023 Active Self           Med Note Marlaine Silos Jun 27, 2022  8:47 AM) [facility] [phone]. Dose confirmed   ondansetron  (ZOFRAN -ODT) 4 MG disintegrating tablet [id number] No Take 1 tablet (4 mg total) by mouth every 8 (eight) hours as needed for nausea or vomiting. Brown, Blaine K, PA-C Taking Active   OVER THE COUNTER MEDICATION [id number] No Take 1 tablet by mouth daily. Vitadone multi-vitamin [provider] Taking Active Self           Med Note Nicoletta Barrier, Mason Sole   Tue Sep 13, 2022  8:35 AM)    propranolol  (INDERAL ) 10 MG tablet [id number]  Take by mouth. [provider]  Active   QUEtiapine  (SEROQUEL ) 50 MG tablet [id number] No Take 1 tablet (50 mg total) by mouth at bedtime. Augusta Blizzard, MD Past Month Expired 08/26/23 2359   sertraline  (ZOLOFT ) 100 MG tablet [id number] No Take 1 tablet (100 mg total) by mouth daily. Augusta Blizzard, MD Past Month Expired 07/27/23 2359             Recommendation:   Continue utilizing strategies discussed to assist with symptom management  Follow Up Plan:   Telephone follow-up in 1 month  Alease Hunter, LCSW [place]  [facility], [facility] Clinical Social Worker Direct Dial: [phone]  Fax: [phone] Website: [email or web address] 2:01 PM

## 2023-09-06 NOTE — Patient Instructions (Signed)
 Visit Information  Thank you for taking time to visit with me today. Please don't hesitate to contact me if I can be of assistance to you before our next scheduled appointment.  Our next appointment is by telephone on 5/19 at 1:30 PM Please call the care guide team at [phone] if you need to cancel or reschedule your appointment.   Following is a copy of your care plan:   Goals Addressed             This Visit's Progress    LCSW VBCI Social Work Care Plan   On track    Problems:   Disease Management support and education needs related to Bipolar Disorder, PTSD, and Anxiety and MDD and Financial Strain  and Housing   CSW Clinical Goal(s):   Over the next 30 days the Patient will attend all scheduled medical appointments as evidenced by patient report and care team review of appointment completion in electronic MEDICAL RECORD NUMBER  demonstrate a reduction in symptoms related to Bipolar Disorder PTSD MDD and GAD  explore community resource options for unmet needs related to Housing .  Interventions:  Social Determinants of Health in Patient with Bipolar Disorder PTSD MDD/GAD : SDOH assessments completed: Financial Strain  and Housing  Evaluation of current treatment plan related to unmet needs Housing resources: Mental Health:  Evaluation of current treatment plan related to Bipolar Disorder, PTSD, and MDD/GAD Active listening / Reflection utilized Financial risk analyst / information provided Emotional Support Provided Participation in counseling encourage : Patient participates in counseling and med management at [facility] Reviewed mental health medications and discussed importance of compliance: Patient reports compliance Solution-Focued Strategies employed: Contact [employer] for additional support services  Patient Goals/Self-Care Activities:  Continue taking your medication as prescribed.   Continue with therapy  . Increase coping skills and healthy habits  Plan:    Telephone follow up appointment with care management team member scheduled for:  2-4 weeks     COMPLETED: Obtain Supportive Resources-Housing       Activities and task to complete in order to accomplish goals.   Keep all upcoming appointments discussed today Continue with compliance of taking medication prescribed by Doctor Implement healthy coping skills discussed to assist with management of symptoms Continue f/up with [facility] by calling every 1-2 weeks (per agency)         Please call the Suicide and Crisis Lifeline: 988 call 911 if you are experiencing a Mental Health or Behavioral Health Crisis or need someone to talk to.  Patient verbalizes understanding of instructions and care plan provided today and agrees to view in MyChart. Active MyChart status and patient understanding of how to access instructions and care plan via MyChart confirmed with patient.     Arlis Bent [facility]  [facility], [facility] Clinical Social Worker Direct Dial: [phone]  Fax: [phone] Website: [email or web address] 2:02 PM

## 2023-09-08 ENCOUNTER — Telehealth: Payer: Self-pay | Admitting: *Deleted

## 2023-09-08 NOTE — Progress Notes (Signed)
 Complex Care Management Note Care Guide Note  09/08/2023 Name: Todd [name] MRN: [id number] DOB: Dec 09, 1975   Complex Care Management Outreach Attempts: An unsuccessful telephone outreach was attempted today to offer the patient information about available complex care management services.  Follow Up Plan:  Additional outreach attempts will be made to offer the patient complex care management information and services.   Encounter Outcome:  No Answer [name] Health Care Guide  Direct Dial:[phone] Fax:[phone] Website: [email or web address]

## 2023-09-11 ENCOUNTER — Encounter: Payer: MEDICAID | Admitting: Family

## 2023-09-11 ENCOUNTER — Telehealth: Payer: Self-pay | Admitting: *Deleted

## 2023-09-11 NOTE — Progress Notes (Signed)
 Complex Care Management Note Care Guide Note  09/11/2023 Name: Todd [name] MRN: [id number] DOB: November 03, 1975   Complex Care Management Outreach Attempts: An unsuccessful telephone outreach was attempted today to offer the patient information about available complex care management services.  Follow Up Plan:  Additional outreach attempts will be made to offer the patient complex care management information and services.   Encounter Outcome:  No Answer  [name] HealthPopulation Health Care Guide  Direct Dial:[phone] Fax:[phone] Website: [email or web address]

## 2023-09-11 NOTE — Progress Notes (Signed)
 Erroneous encounter-disregard

## 2023-09-12 ENCOUNTER — Telehealth: Payer: Self-pay | Admitting: *Deleted

## 2023-09-12 NOTE — Progress Notes (Signed)
 Complex Care Management Note Care Guide Note  09/12/2023 Name: Todd [name] MRN: [id number] DOB: Mar 24, 1976  Todd [name] is a 48 y.o. year old male who is a primary care patient of Allwardt, Alyssa M, PA-C . The community resource team was consulted for assistance with Transportation Needs , Food Insecurity, and homelesss  SDOH screenings and interventions completed:  Yes  Social Drivers of Health From This Encounter   Food Insecurity: Food Insecurity Present (09/12/2023)   Hunger Vital Sign    Worried About Running Out of Food in the Last Year: Sometimes true    Ran Out of Food in the Last Year: Never true    SDOH Interventions Today    Flowsheet Row Most Recent Value  SDOH Interventions   Food Insecurity Interventions Community Resources Provided  Housing Interventions Community Resources Provided  [told patient to maintain relationship with partners ending homelessness and also to reach out to the tiny home community ,]  Transportation Interventions Payor Benefit  [has trillium for medicaid tranportation and would like some bus passes sent to the [employer]]  Utilities Interventions Intervention Not Indicated  [patient no need for utilities]        Care guide performed the following interventions: Patient provided with information about care guide support team and interviewed to confirm resource needs.  Follow Up Plan:  No further follow up planned at this time. The patient has been provided with needed resources.  Encounter Outcome:  Patient Visit Completed  [name]  [facility] Guide  Direct Dial:[phone] Fax:[phone] Website: [email or web address]

## 2023-10-02 ENCOUNTER — Other Ambulatory Visit: Payer: MEDICAID | Admitting: Licensed Clinical Social Worker

## 2023-10-04 NOTE — Patient Instructions (Signed)
 Visit Information  Thank you for taking time to visit with me today. Please don't hesitate to contact me if I can be of assistance to you before our next scheduled appointment.  Closing From: Complex Care Management.  Please call the care guide team at [phone] if you need to cancel, schedule, or reschedule an appointment.   Please call the Suicide and Crisis Lifeline: 988 go to [facility] [address], [place] [phone]) call 911 if you are experiencing a Mental Health or Behavioral Health Crisis or need someone to talk to.  Alease Hunter, LCSW [place]  [facility], [facility] Clinical Social Worker Direct Dial: [phone]  Fax: [phone] Website: [email or web address] 9:24 AM

## 2023-10-04 NOTE — Patient Outreach (Signed)
 Complex Care Management   Visit Note  10/02/2023  Name:  Todd [name] MRN: [id number] DOB: 05/20/1975  Situation: Referral received for Complex Care Management related to Mental/Behavioral Health diagnosis Anxiety, Bipolar Disorder, MDD I obtained verbal consent from Patient.  Visit completed with pt  on the phone  Background:   Past Medical History:  Diagnosis Date   Anxiety    Cocaine dependence (HCC) 02/24/2012   Depression    GERD (gastroesophageal reflux disease)    Hepatitis C    2014   Hyperlipidemia    Hypertension    Opiate withdrawal (HCC) 02/24/2012   Pneumonia    Pre-diabetes    PTSD (post-traumatic stress disorder)    Sciatica    Substance abuse (HCC)    IV OPIATES CLEAN FOR 5 MONTHS UPDATED 06/27/22    Assessment: Patient Reported Symptoms:  Cognitive Cognitive Status: Alert and oriented to person, place, and time, Normal speech and language skills Cognitive/Intellectual Conditions Management [RPT]: None reported or documented in medical history or problem list      Neurological Neurological Review of Symptoms: Not assessed    HEENT HEENT Symptoms Reported: Not assessed      Cardiovascular Cardiovascular Symptoms Reported: Not assessed    Respiratory Respiratory Symptoms Reported: Not assesed    Endocrine Patient reports the following symptoms related to hypoglycemia or hyperglycemia : Not assessed    Gastrointestinal Gastrointestinal Symptoms Reported: Not assessed      Genitourinary Genitourinary Symptoms Reported: Not assessed    Integumentary Integumentary Symptoms Reported: Not assessed    Musculoskeletal Musculoskelatal Symptoms Reviewed: Not assessed        Psychosocial Psychosocial Symptoms Reported: No symptoms reported Behavioral Health Conditions: Anxiety, Bipolar affective disorder, Post traumatic stress Behavioral Management Strategies: Community resources, Coping strategies, Complementary therapy(ies), Support system Major  Change/Loss/Stressor/Fears (CP): Medical condition, self, Environment        05/18/2023    9:11 AM  Depression screen PHQ 2/9  Decreased Interest   Down, Depressed, Hopeless   PHQ - 2 Score   Altered sleeping   Tired, decreased energy   Change in appetite   Feeling bad or failure about yourself    Trouble concentrating   Moving slowly or fidgety/restless   Suicidal thoughts   PHQ-9 Score   Difficult doing work/chores      Information is confidential and restricted. Go to Review Flowsheets to unlock data.    There were no vitals filed for this visit.  Medications Reviewed Today     Reviewed by Adriana Albany, LCSW (Social Worker) on 10/04/23 at [phone]  Med List Status: <None>   Medication Order Taking? Sig Documenting Provider Last Dose Status Informant  acetaminophen  (TYLENOL ) 325 MG tablet [id number] No Take 650 mg by mouth every 6 (six) hours as needed for moderate pain. [provider] 06/30/2023 Active Self  aspirin EC 81 MG tablet [id number] No Take 81 mg by mouth daily. Swallow whole. [provider] 06/30/2023 Active Self           Med Note Dennice Fishman A   Mon Jun 27, 2022  8:42 AM) On hold for procedure   atorvastatin  (LIPITOR) 20 MG tablet [id number] No Take 1 tablet (20 mg total) by mouth daily. Allwardt, Deleta Felix, PA-C 06/30/2023 Active   chlorthalidone  (HYGROTON ) 25 MG tablet [id number]  TAKE 1/2 TABLET BY MOUTH EVERY DAY Allwardt, Alyssa M, PA-C  Active   gabapentin  (NEURONTIN ) 400 MG capsule [id number]  Take 1 capsule (400 mg total) by mouth 3 (  three) times daily. Ann Keto, MD  Expired 09/28/23 2359   hydrOXYzine  (ATARAX ) 50 MG tablet [id number] No Take 1 tablet (50 mg total) by mouth 3 (three) times daily as needed. Augusta Blizzard, MD Past Month Active   ibuprofen  (ADVIL ) 200 MG tablet [id number] No Take 600-800 mg by mouth every 6 (six) hours as needed for moderate pain. [provider] Taking Active Self  lisinopril  (ZESTRIL ) 20 MG  tablet [id number]  Take 1 tablet (20 mg total) by mouth in the morning and at bedtime. Ann Keto, MD  Active   methadone (DOLOPHINE) 10 MG/ML solution [id number] No Take 140 mg by mouth daily. [provider] 06/30/2023 Active Self           Med Note Marlaine Silos Jun 27, 2022  8:47 AM) [facility] [place] [phone]. Dose confirmed   ondansetron  (ZOFRAN -ODT) 4 MG disintegrating tablet [id number] No Take 1 tablet (4 mg total) by mouth every 8 (eight) hours as needed for nausea or vomiting. Brown, Blaine K, PA-C Taking Active   OVER THE COUNTER MEDICATION [id number] No Take 1 tablet by mouth daily. Vitadone multi-vitamin [provider] Taking Active Self           Med Note Nicoletta Barrier, Mason Sole   Tue Sep 13, 2022  8:35 AM)    propranolol  (INDERAL ) 10 MG tablet [id number]  Take by mouth. [provider]  Active   QUEtiapine  (SEROQUEL ) 50 MG tablet [id number] No Take 1 tablet (50 mg total) by mouth at bedtime. Augusta Blizzard, MD Past Month Expired 08/26/23 2359   sertraline  (ZOLOFT ) 100 MG tablet [id number] No Take 1 tablet (100 mg total) by mouth daily. Augusta Blizzard, MD Past Month Expired 07/27/23 2359             Recommendation:   PCP Follow-up Follow up with Psychiatrist/Counselor  Follow Up Plan:   Closing From:  Complex Care Management  Alease Hunter, LCSW [place]  [facility], [facility] Clinical Social Worker Direct Dial: [phone]  Fax: [phone] Website: [email or web address] 9:24 AM

## 2023-10-17 ENCOUNTER — Telehealth (HOSPITAL_COMMUNITY): Payer: Self-pay

## 2023-10-17 NOTE — Telephone Encounter (Signed)
 Medication refill - fax from pt's [facility] requesting a new Quetiapine  50 mg order, last provided 06/27/23 + 1 refill and last filled 08/29/23. Patient no showed appt 08/08/23 and has not rescheduled.

## 2023-10-31 ENCOUNTER — Ambulatory Visit (HOSPITAL_COMMUNITY)
Admission: RE | Admit: 2023-10-31 | Discharge: 2023-10-31 | Disposition: A | Discharge status: Home / Self Care | Payer: MEDICAID | Source: Ambulatory Visit | Attending: Emergency Medicine | Admitting: Emergency Medicine

## 2023-10-31 ENCOUNTER — Encounter (HOSPITAL_COMMUNITY): Payer: Self-pay

## 2023-10-31 VITALS — BP 128/87 | HR 56 | Temp 99.0°F | Resp 16

## 2023-10-31 DIAGNOSIS — K047 Periapical abscess without sinus: Secondary | ICD-10-CM

## 2023-10-31 MED ORDER — IBUPROFEN 600 MG PO TABS: 600.0000 mg | ORAL_TABLET | Freq: Four times a day (QID) | ORAL | 0 refills | Status: DC | PRN

## 2023-10-31 MED ORDER — CLINDAMYCIN HCL 150 MG PO CAPS: 450.0000 mg | ORAL_CAPSULE | Freq: Three times a day (TID) | ORAL | 0 refills | Status: AC

## 2023-10-31 MED ORDER — NYSTATIN 100000 UNIT/ML MT SUSP: 5.0000 mL | Freq: Three times a day (TID) | OROMUCOSAL | 0 refills | Status: DC | PRN

## 2023-10-31 MED ORDER — CHLORHEXIDINE GLUCONATE 0.12 % MT SOLN: 15.0000 mL | Freq: Two times a day (BID) | OROMUCOSAL | 0 refills | Status: DC

## 2023-10-31 NOTE — ED Provider Notes (Signed)
 [facility]    CSN: [id number] Arrival date & time: 10/31/23  1414      History   Chief Complaint Chief Complaint  Patient presents with   Dental Pain    HPI Todd [name] is a 48 y.o. male.   Patient presents with upper right-sided dental pain and abscess x 1 week.  Patient reports history of dental abscesses and believes this is the same.  Patient states that he does not currently have a dentist.  Patient states that he is still trying to get his insurance in order and will seek a dentist when he is able to do this.  Patient denies taking anything for pain.  Patient states that he is a recovering polysubstance abuser and has been clean for 3 years and does not really want anything for pain due to this.  The history is provided by the patient and medical records.  Dental Pain   Past Medical History:  Diagnosis Date   Anxiety    Cocaine dependence (HCC) 02/24/2012   Depression    GERD (gastroesophageal reflux disease)    Hepatitis C    2014   Hyperlipidemia    Hypertension    Opiate withdrawal (HCC) 02/24/2012   Pneumonia    Pre-diabetes    PTSD (post-traumatic stress disorder)    Sciatica    Substance abuse (HCC)    IV OPIATES CLEAN FOR 5 MONTHS UPDATED 06/27/22    Patient Active Problem List   Diagnosis Date Noted   Preop examination 06/15/2022   Prediabetes 04/29/2022   Vaping nicotine  dependence, non-tobacco product 04/29/2022   Mixed hyperlipidemia 04/29/2022   Chronic hepatitis (HCC) 04/19/2022   Anxiety and depression 04/15/2022   Encounter for annual physical exam 04/15/2022   Acute psychosis (HCC) 03/04/2019   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   Alcohol use disorder, severe, dependence (HCC) 05/20/2018   MDD (major depressive disorder), recurrent episode, severe (HCC) 04/26/2018   AKI (acute kidney injury) (HCC) 09/01/2016   Rhabdomyolysis 09/01/2016   Essential  hypertension 09/01/2016   Left flank pain    Major depressive disorder, 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   Polysubstance (including opioids) dependence with physiological dependence (HCC) 03/10/2016   Bipolar II disorder, most recent episode depressed with rapid cycling (HCC) 10/30/2015   Depression, major, recurrent, moderate (HCC) 10/27/2015   Substance induced mood disorder (HCC) 02/26/2013   PTSD (post-traumatic stress disorder) 02/26/2013   Polysubstance dependence including opioid type drug, episodic abuse (HCC) 02/24/2013   Alcohol abuse 02/24/2013   Cannabis dependence, continuous (HCC) 02/24/2012    Past Surgical History:  Procedure Laterality Date   KNEE ARTHROSCOPY WITH MEDIAL MENISECTOMY Right 06/28/2022   Procedure: KNEE ARTHROSCOPY WITH MEDIAL MENISECTOMY;  Surgeon: Osa Blase, MD;  Location: [facility];  Service: Orthopedics;  Laterality: Right;   MENSICUS Right    06/28/22   MOUTH SURGERY     TESTICLE TORSION REDUCTION     age 34       Home Medications    Prior to Admission medications   Medication Sig Start Date End Date Taking? Authorizing Provider  chlorhexidine (PERIDEX) 0.12 % solution Use as directed 15 mLs in the mouth or throat 2 (two) times daily. 10/31/23  Yes Rosevelt Constable, Desiderio Dolata A, NP  clindamycin (CLEOCIN) 150 MG capsule Take 3 capsules (450 mg total) by mouth in the morning, at noon, and at bedtime for 7 days. 10/31/23 11/07/23 Yes Karon Packer, NP  ibuprofen  (  ADVIL ) 600 MG tablet Take 1 tablet (600 mg total) by mouth every 6 (six) hours as needed. 10/31/23  Yes Levora Reas A, NP  magic mouthwash (nystatin, lidocaine , diphenhydrAMINE, alum & mag hydroxide) suspension Swish and spit 5 mLs 3 (three) times daily as needed for mouth pain. 10/31/23  Yes Levora Reas A, NP  acetaminophen  (TYLENOL ) 325 MG tablet Take 650 mg by mouth every 6 (six) hours as needed for moderate pain.    [provider]  aspirin EC 81 MG  tablet Take 81 mg by mouth daily. Swallow whole.    [provider]  atorvastatin  (LIPITOR) 20 MG tablet Take 1 tablet (20 mg total) by mouth daily. 09/21/22   Allwardt, Alyssa M, PA-C  chlorthalidone  (HYGROTON ) 25 MG tablet TAKE 1/2 TABLET BY MOUTH EVERY DAY 08/29/23   Allwardt, Alyssa M, PA-C  gabapentin  (NEURONTIN ) 400 MG capsule Take 1 capsule (400 mg total) by mouth 3 (three) times daily. 06/30/23 09/28/23  Ann Keto, MD  hydrOXYzine  (ATARAX ) 50 MG tablet Take 1 tablet (50 mg total) by mouth 3 (three) times daily as needed. 06/27/23   Augusta Blizzard, MD  lisinopril  (ZESTRIL ) 20 MG tablet Take 1 tablet (20 mg total) by mouth in the morning and at bedtime. 06/30/23   Banister, Pamela K, MD  methadone (DOLOPHINE) 10 MG/ML solution Take 140 mg by mouth daily.    [provider]  ondansetron  (ZOFRAN -ODT) 4 MG disintegrating tablet Take 1 tablet (4 mg total) by mouth every 8 (eight) hours as needed for nausea or vomiting. 06/28/22   Brown, Blaine K, PA-C  OVER THE COUNTER MEDICATION Take 1 tablet by mouth daily. Vitadone multi-vitamin    [provider]  propranolol  (INDERAL ) 10 MG tablet Take by mouth. 09/04/17   [provider]  QUEtiapine  (SEROQUEL ) 50 MG tablet Take 1 tablet (50 mg total) by mouth at bedtime. 06/27/23 08/26/23  Augusta Blizzard, MD  sertraline  (ZOLOFT ) 100 MG tablet Take 1 tablet (100 mg total) by mouth daily. 06/27/23 07/27/23  Augusta Blizzard, MD    Family History Family History  Problem Relation Age of Onset   Drug abuse Mother    Melanoma Mother    COPD Father    Hypertension Father    Diabetes Mellitus II Father    Heart disease Father    Depression Father    Anxiety disorder Father    Colon polyps Paternal Uncle    Colon cancer Neg Hx    Crohn's disease Neg Hx    Esophageal cancer Neg Hx    Rectal cancer Neg Hx    Stomach cancer Neg Hx    Ulcerative colitis Neg Hx     Social History Social History   Tobacco Use   Smoking status: Former     Current packs/day: 0.00    Types: Cigarettes    Quit date: 03/08/2021    Years since quitting: 2.6   Smokeless tobacco: Never   Tobacco comments:    Pt declines teaching  Vaping Use   Vaping status: Some Days   Substances: Nicotine , Flavoring  Substance Use Topics   Alcohol use: Not Currently   Drug use: Not Currently    Types: IV, Heroin, Cocaine, Marijuana, Methamphetamines    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)     Allergies   Penicillins, Valproic acid, and Haloperidol lactate   Review of Systems Review of Systems  Per HPI  Physical Exam Triage Vital Signs ED Triage Vitals  Encounter Vitals Group     BP 10/31/23 1448 128/87     Girls Systolic BP Percentile --      Girls Diastolic BP Percentile --      Boys Systolic BP Percentile --      Boys Diastolic BP Percentile --      Pulse Rate 10/31/23 1448 (!) 56     Resp 10/31/23 1448 16     Temp 10/31/23 1448 99 F (37.2 C)     Temp Source 10/31/23 1448 Oral     SpO2 10/31/23 1448 95 %     Weight --      Height --      Head Circumference --      Peak Flow --      Pain Score 10/31/23 1447 3     Pain Loc --      Pain Education --      Exclude from Growth Chart --    No data found.  Updated Vital Signs BP 128/87 (BP Location: Left Arm)   Pulse (!) 56   Temp 99 F (37.2 C) (Oral)   Resp 16   SpO2 95%   Visual Acuity Right Eye Distance:   Left Eye Distance:   Bilateral Distance:    Right Eye Near:   Left Eye Near:    Bilateral Near:     Physical Exam Vitals and nursing note reviewed.  Constitutional:      General: He is awake. He is not in acute distress.    Appearance: Normal appearance. He is well-developed and well-groomed. He is not ill-appearing.  HENT:     Mouth/Throat:     Dentition: Dental tenderness, dental caries and dental abscesses present.     Comments: Approximately 2 cm in diameter fluctuant tender abscess noted to the upper right front gumline.  Multiple  missing teeth and dental caries noted throughout.  Skin:    General: Skin is warm and dry.   Neurological:     Mental Status: He is alert.   Psychiatric:        Behavior: Behavior is cooperative.      UC Treatments / Results  Labs (all labs ordered are listed, but only abnormal results are displayed) Labs Reviewed - No data to display  EKG   Radiology No results found.  Procedures Procedures (including critical care time)  Medications Ordered in UC Medications - No data to display  Initial Impression / Assessment and Plan / UC Course  I have reviewed the triage vital signs and the nursing notes.  Pertinent labs & imaging results that were available during my care of the patient were reviewed by me and considered in my medical decision making (see chart for details).     Patient is overall well-appearing.  Vitals are stable.  Upon assessment there is an approximately 2 cm in diameter fluctuant tender abscess noted to the right upper right front gumline.  Prescribed clindamycin for dental abscess coverage due to penicillin allergy.  Prescribed Peridex mouthwash for additional infection coverage.  Prescribed Magic mouthwash as needed for mouth pain.  Prescribed ibuprofen  as needed for pain.  Given dental resources to follow-up with.  Discussed follow-up and return precautions. Final Clinical Impressions(s) / UC Diagnoses   Final diagnoses:  Dental abscess     Discharge Instructions      Start taking 3 capsules of clindamycin at one time 3 times daily for 7 days for dental abscess coverage. Use Peridex mouthwash twice daily for additional  infection coverage. You can use Magic mouthwash by swishing and spitting 3 times daily for additional mouth pain relief.  Do not swallow this. You can take 600 mg of ibuprofen  every 6-8 hours as needed for pain. Otherwise you can take 650 mg of Tylenol  every 6-8 hours as needed for breakthrough pain. I have attached some dental  resources that you can follow-up with. You can also try calling urgent tooth: Address: [address] [place], [place], [place] [address] Phone: [phone]     ED Prescriptions     Medication Sig Dispense Auth. Provider   clindamycin (CLEOCIN) 150 MG capsule Take 3 capsules (450 mg total) by mouth in the morning, at noon, and at bedtime for 7 days. 63 capsule Rosevelt Constable, Demani Weyrauch A, NP   chlorhexidine (PERIDEX) 0.12 % solution Use as directed 15 mLs in the mouth or throat 2 (two) times daily. 120 mL Levora Reas A, NP   magic mouthwash (nystatin, lidocaine , diphenhydrAMINE, alum & mag hydroxide) suspension Swish and spit 5 mLs 3 (three) times daily as needed for mouth pain. 180 mL Levora Reas A, NP   ibuprofen  (ADVIL ) 600 MG tablet Take 1 tablet (600 mg total) by mouth every 6 (six) hours as needed. 30 tablet Levora Reas A, NP      PDMP not reviewed this encounter.   Levora Reas A, NP 10/31/23 [phone]

## 2023-10-31 NOTE — Discharge Instructions (Addendum)
 Start taking 3 capsules of clindamycin at one time 3 times daily for 7 days for dental abscess coverage. Use Peridex mouthwash twice daily for additional infection coverage. You can use Magic mouthwash by swishing and spitting 3 times daily for additional mouth pain relief.  Do not swallow this. You can take 600 mg of ibuprofen  every 6-8 hours as needed for pain. Otherwise you can take 650 mg of Tylenol  every 6-8 hours as needed for breakthrough pain. I have attached some dental resources that you can follow-up with. You can also try calling urgent tooth: Address: [address] [place], [place], [place] [address] Phone: [phone]

## 2023-10-31 NOTE — ED Triage Notes (Signed)
 Pt states right sided dental pain for the past week.

## 2023-11-24 ENCOUNTER — Other Ambulatory Visit: Payer: Self-pay | Admitting: Physician Assistant

## 2023-11-24 DIAGNOSIS — F4310 Post-traumatic stress disorder, unspecified: Secondary | ICD-10-CM

## 2023-11-24 DIAGNOSIS — F419 Anxiety disorder, unspecified: Secondary | ICD-10-CM

## 2023-12-11 ENCOUNTER — Other Ambulatory Visit: Payer: Self-pay | Admitting: Physician Assistant

## 2023-12-11 DIAGNOSIS — F4310 Post-traumatic stress disorder, unspecified: Secondary | ICD-10-CM

## 2023-12-11 DIAGNOSIS — F32A Depression, unspecified: Secondary | ICD-10-CM

## 2024-01-27 IMAGING — DX DG CHEST 2V
2 series · 2 of 2 positions shown · non-contrast
Comparison: None

CLINICAL DATA: Body aches, fever, cough and congestion for 3 days,
positive for [id number] and RSV exposure

EXAM:
CHEST - 2 VIEW

[chest pa]
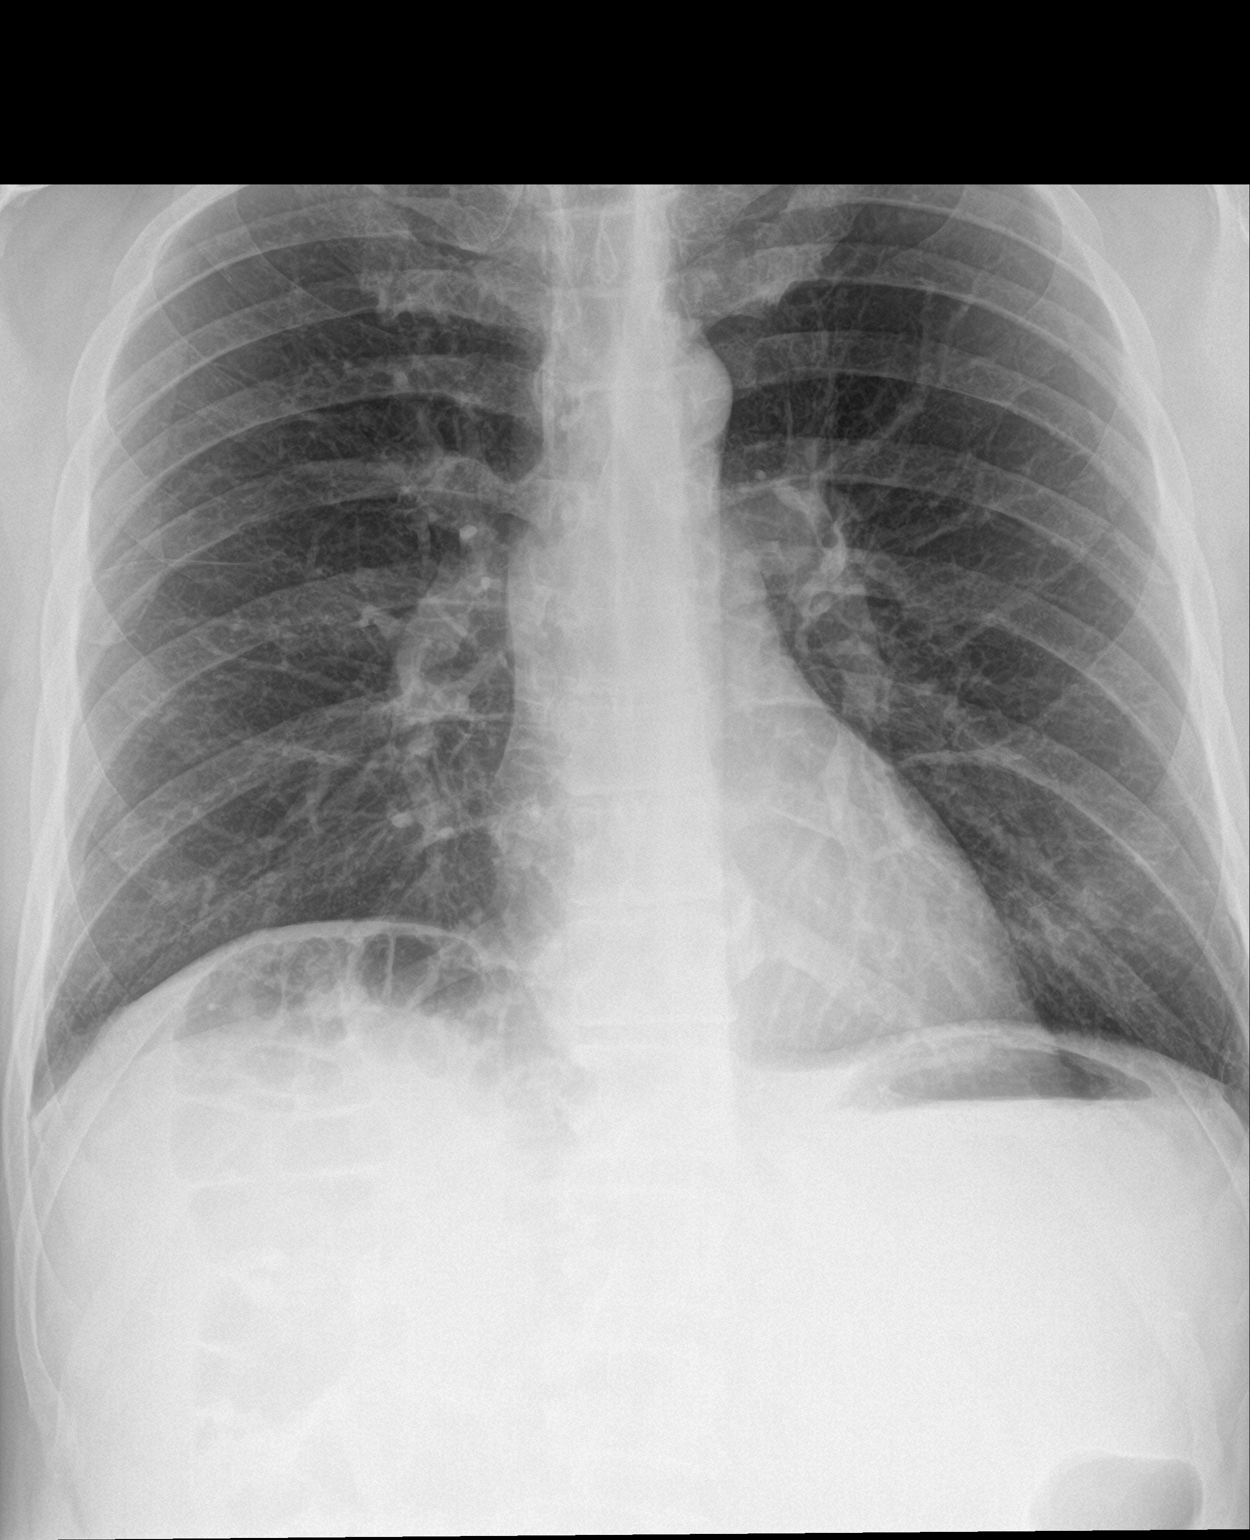

[chest lat]
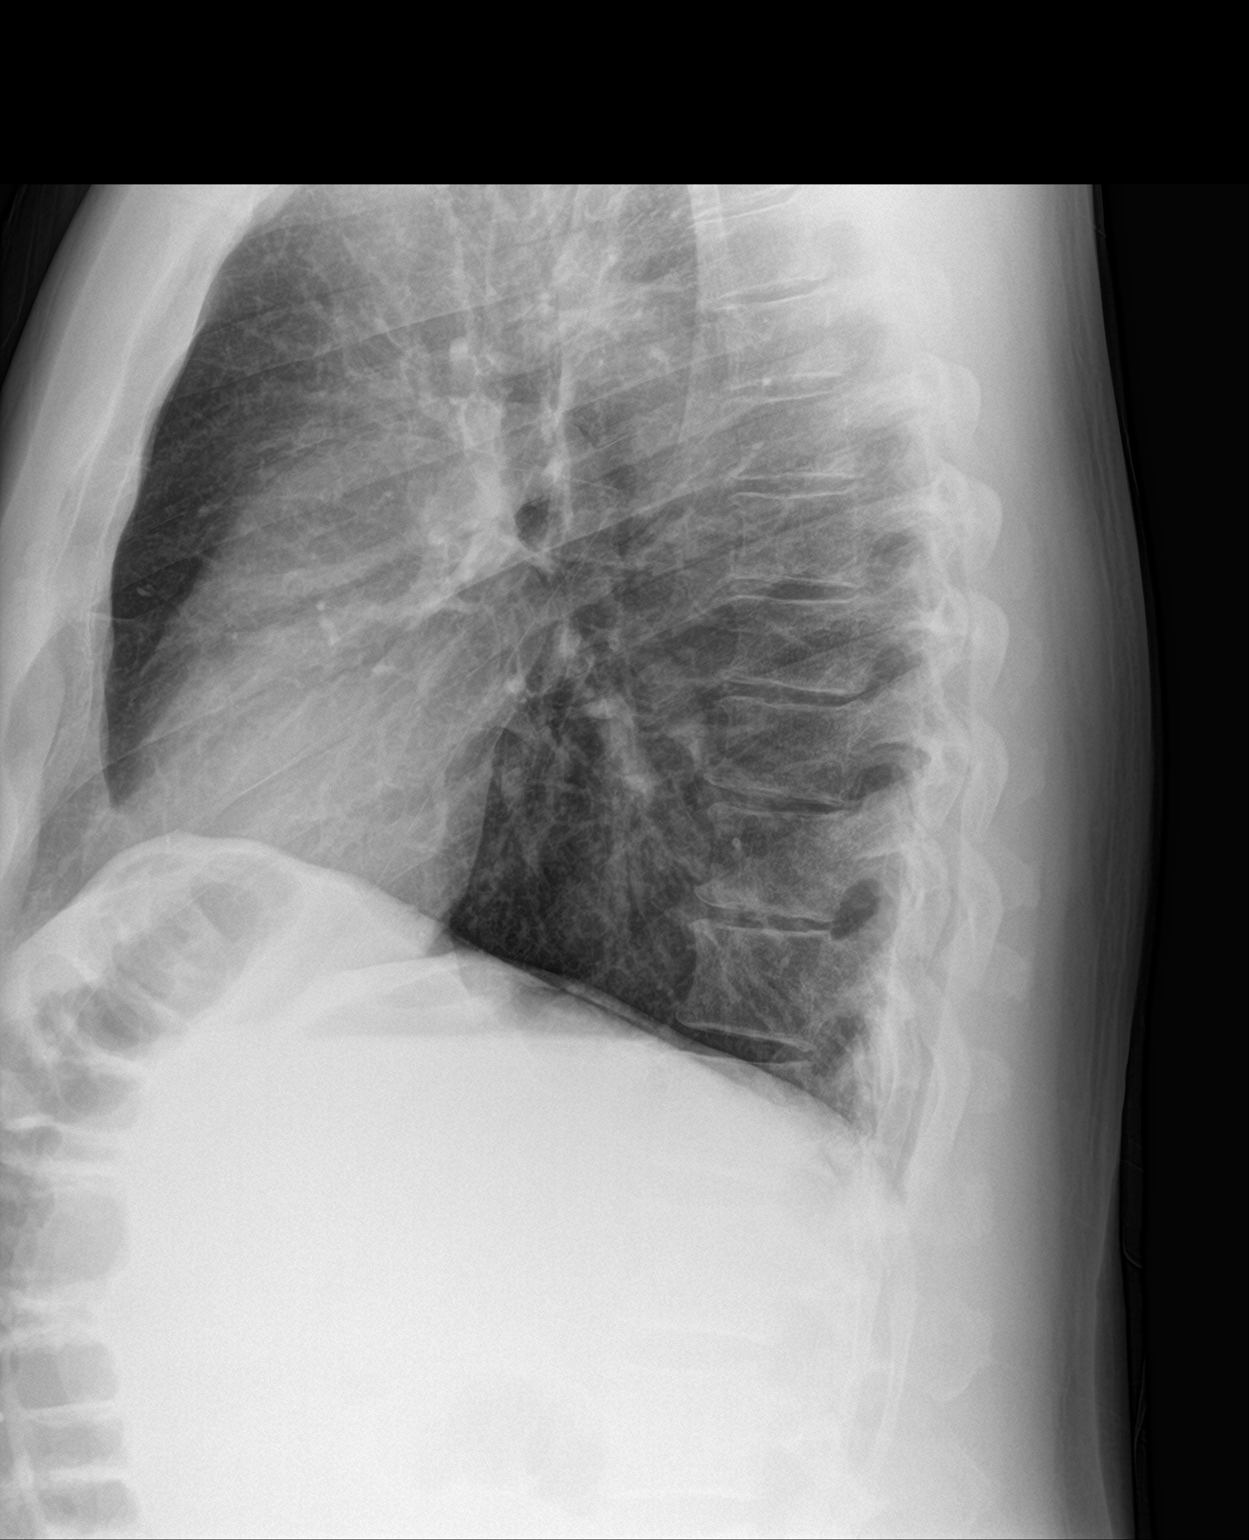

[2 of 2 positions shown; findings below may reference images not displayed]

FINDINGS: Normal heart size, mediastinal contours, and pulmonary vascularity.

Lungs clear.

No pulmonary infiltrate, pleural effusion, or pneumothorax.

Lower lateral LEFT costal margin excluded.

No acute osseous findings.
IMPRESSION: No acute abnormalities.

## 2024-02-14 ENCOUNTER — Telehealth (HOSPITAL_COMMUNITY): Payer: Self-pay

## 2024-02-14 NOTE — Telephone Encounter (Signed)
 Pt's chart was accessed due to pt being on a backlogged list of patients needing assistance with PCP placement.  Per chart review, pt has since been seen by PCP. No further action taken at this time.

## 2024-03-11 ENCOUNTER — Other Ambulatory Visit: Payer: Self-pay | Admitting: Physician Assistant

## 2024-03-11 DIAGNOSIS — F419 Anxiety disorder, unspecified: Secondary | ICD-10-CM

## 2024-03-11 DIAGNOSIS — F4310 Post-traumatic stress disorder, unspecified: Secondary | ICD-10-CM

## 2024-05-03 ENCOUNTER — Encounter (HOSPITAL_COMMUNITY): Payer: Self-pay

## 2024-05-03 ENCOUNTER — Ambulatory Visit (HOSPITAL_COMMUNITY)
Admission: EM | Admit: 2024-05-03 | Discharge: 2024-05-03 | Disposition: A | Discharge status: Home / Self Care | Payer: MEDICAID | Attending: Emergency Medicine | Admitting: Emergency Medicine

## 2024-05-03 DIAGNOSIS — Z760 Encounter for issue of repeat prescription: Secondary | ICD-10-CM | POA: Diagnosis not present

## 2024-05-03 DIAGNOSIS — F4310 Post-traumatic stress disorder, unspecified: Secondary | ICD-10-CM

## 2024-05-03 DIAGNOSIS — I10 Essential (primary) hypertension: Secondary | ICD-10-CM

## 2024-05-03 DIAGNOSIS — F419 Anxiety disorder, unspecified: Secondary | ICD-10-CM | POA: Diagnosis not present

## 2024-05-03 DIAGNOSIS — Z20822 Contact with and (suspected) exposure to covid-19: Secondary | ICD-10-CM

## 2024-05-03 DIAGNOSIS — F32A Depression, unspecified: Secondary | ICD-10-CM

## 2024-05-03 LAB — POC SOFIA SARS ANTIGEN FIA: SARS Coronavirus 2 Ag: NEGATIVE

## 2024-05-03 MED ORDER — GABAPENTIN 400 MG PO CAPS: 400.0000 mg | ORAL_CAPSULE | Freq: Three times a day (TID) | ORAL | 0 refills | Status: AC

## 2024-05-03 MED ORDER — HYDROXYZINE HCL 50 MG PO TABS: 50.0000 mg | ORAL_TABLET | Freq: Three times a day (TID) | ORAL | 0 refills | Status: AC | PRN

## 2024-05-03 MED ORDER — LISINOPRIL 20 MG PO TABS: 20.0000 mg | ORAL_TABLET | Freq: Two times a day (BID) | ORAL | 0 refills | Status: AC

## 2024-05-03 MED ORDER — SERTRALINE HCL 100 MG PO TABS: 100.0000 mg | ORAL_TABLET | Freq: Every day | ORAL | 0 refills | Status: AC

## 2024-05-03 NOTE — ED Provider Notes (Signed)
 " [facility]    CSN: [id number] Arrival date & time: 05/03/24  1215      History   Chief Complaint No chief complaint on file.   HPI Todd [name] is a 48 y.o. male.   Patient presents for medication refill of Zoloft , gabapentin , lisinopril , Vistaril , and chlorthalidone .  Patient was last prescribed Zoloft  without any refills in February of this year and has not been taking it since he ran out of this.  Patient reports that he normally takes gabapentin  3 times daily for chronic sciatica and ran out of this a couple months ago. Patient also takes hydroxyzine  as needed for anxiety and to help him get some sleep.  Per chart review patient has requested multiple refills from his PCP for Zoloft  over the last few months but this has been denied because it is recommended that patient follow-up in person in order to receive this refill.  Upon discussing this with patient patient reports that he was seen by an outside provider in the [place] area and was prescribed 100 mg Zoloft  once daily for 30 days with 1 additional refill and reports that he picked this prescription up on 11/25.  Patient states that he has now been out without this medication for 3 days.  Patient reports that this provider had suggested that he take 150 mg versus 100 mg and therefore patient began to take 1 tablet and a half a tablet each time that he took this which is why he ran out of the medication early.  Patient also reports that he normally takes lisinopril  and chlorthalidone  for his blood pressure.  Patient was last prescribed these earlier this year as well.  It was recommended that he follow-up with PCP prior to receiving any additional refills of the chlorthalidone .  Patient is also requesting COVID testing.  Patient states that he has had mild sore throat and some mild abdominal discomfort. Patient reports that his roommate recently tested positive for COVID and wants to be sure that he has not  contracted this as well.  The history is provided by the patient and medical records.    Past Medical History:  Diagnosis Date   Anxiety    Cocaine dependence (HCC) 02/24/2012   Depression    GERD (gastroesophageal reflux disease)    Hepatitis C    2014   Hyperlipidemia    Hypertension    Opiate withdrawal (HCC) 02/24/2012   Pneumonia    Pre-diabetes    PTSD (post-traumatic stress disorder)    Sciatica    Substance abuse (HCC)    IV OPIATES CLEAN FOR 5 MONTHS UPDATED 06/27/22    Patient Active Problem List   Diagnosis Date Noted   Preop examination 06/15/2022   Prediabetes 04/29/2022   Vaping nicotine  dependence, non-tobacco product 04/29/2022   Mixed hyperlipidemia 04/29/2022   Chronic hepatitis (HCC) 04/19/2022   Anxiety and depression 04/15/2022   Encounter for annual physical exam 04/15/2022   Acute psychosis (HCC) 03/04/2019   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   Alcohol use disorder, severe, dependence (HCC) 05/20/2018   MDD (major depressive disorder), recurrent episode, severe (HCC) 04/26/2018   AKI (acute kidney injury) 09/01/2016   Rhabdomyolysis 09/01/2016   Essential hypertension 09/01/2016   Left flank pain    Major depressive disorder, 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   Polysubstance (including opioids) dependence with physiological dependence (HCC) 03/10/2016   Bipolar II disorder, most recent episode depressed with rapid  cycling (HCC) 10/30/2015   Depression, major, recurrent, moderate (HCC) 10/27/2015   Substance induced mood disorder (HCC) 02/26/2013   PTSD (post-traumatic stress disorder) 02/26/2013   Polysubstance dependence including opioid type drug, episodic abuse (HCC) 02/24/2013   Alcohol abuse 02/24/2013   Cannabis dependence, continuous (HCC) 02/24/2012    Past Surgical History:  Procedure Laterality Date   KNEE ARTHROSCOPY WITH MEDIAL  MENISECTOMY Right 06/28/2022   Procedure: KNEE ARTHROSCOPY WITH MEDIAL MENISECTOMY;  Surgeon: Josefina Chew, MD;  Location: [facility];  Service: Orthopedics;  Laterality: Right;   MENSICUS Right    06/28/22   MOUTH SURGERY     TESTICLE TORSION REDUCTION     age 40       Home Medications    Prior to Admission medications  Medication Sig Start Date End Date Taking? Authorizing Provider  sertraline  (ZOLOFT ) 100 MG tablet Take 1 tablet (100 mg total) by mouth daily. 05/03/24  Yes Johnie Flaming A, NP  acetaminophen  (TYLENOL ) 325 MG tablet Take 650 mg by mouth [name] 6 (six) hours as needed for moderate pain.    [provider]  aspirin EC 81 MG tablet Take 81 mg by mouth daily. Swallow whole.    [provider]  atorvastatin  (LIPITOR) 20 MG tablet Take 1 tablet (20 mg total) by mouth daily. Patient not taking: Reported on 05/03/2024 09/21/22   Allwardt, Alyssa M, PA-C  chlorthalidone  (HYGROTON ) 25 MG tablet TAKE 1/2 TABLET BY MOUTH [name] Patient not taking: Reported on 05/03/2024 08/29/23   Allwardt, Alyssa M, PA-C  gabapentin  (NEURONTIN ) 400 MG capsule Take 1 capsule (400 mg total) by mouth 3 (three) times daily. 05/03/24 06/02/24  Johnie Flaming A, NP  hydrOXYzine  (ATARAX ) 50 MG tablet Take 1 tablet (50 mg total) by mouth 3 (three) times daily as needed. 05/03/24 06/02/24  Johnie Flaming A, NP  lisinopril  (ZESTRIL ) 20 MG tablet Take 1 tablet (20 mg total) by mouth in the morning and at bedtime. 05/03/24   Johnie Flaming A, NP  methadone (DOLOPHINE) 10 MG/ML solution Take 140 mg by mouth daily.    [provider]  OVER THE COUNTER MEDICATION Take 1 tablet by mouth daily. Vitadone multi-vitamin    [provider]  propranolol  (INDERAL ) 10 MG tablet Take by mouth. 09/04/17   [provider]  QUEtiapine  (SEROQUEL ) 50 MG tablet Take 1 tablet (50 mg total) by mouth at bedtime. Patient not taking: Reported on 05/03/2024 06/27/23 08/26/23  Lynnette Barter,  MD    Family History Family History  Problem Relation Age of Onset   Drug abuse Mother    Melanoma Mother    COPD Father    Hypertension Father    Diabetes Mellitus II Father    Heart disease Father    Depression Father    Anxiety disorder Father    Colon polyps Paternal Uncle    Colon cancer Neg Hx    Crohn's disease Neg Hx    Esophageal cancer Neg Hx    Rectal cancer Neg Hx    Stomach cancer Neg Hx    Ulcerative colitis Neg Hx     Social History Social History[1]   Allergies   Penicillins, Valproic acid, and Haloperidol lactate   Review of Systems Review of Systems  Per HPI  Physical Exam Triage Vital Signs ED Triage Vitals  Encounter Vitals Group     BP 05/03/24 1317 (!) 138/103     Girls Systolic BP Percentile --      Girls Diastolic BP  Percentile --      Boys Systolic BP Percentile --      Boys Diastolic BP Percentile --      Pulse Rate 05/03/24 1317 60     Resp 05/03/24 1317 16     Temp 05/03/24 1317 98.3 F (36.8 C)     Temp Source 05/03/24 1317 Oral     SpO2 05/03/24 1317 96 %     Weight --      Height --      Head Circumference --      Peak Flow --      Pain Score 05/03/24 1319 4     Pain Loc --      Pain Education --      Exclude from Growth Chart --    No data found.  Updated Vital Signs BP (!) 138/103 (BP Location: Right Arm)   Pulse 60   Temp 98.3 F (36.8 C) (Oral)   Resp 16   SpO2 96%   Visual Acuity Right Eye Distance:   Left Eye Distance:   Bilateral Distance:    Right Eye Near:   Left Eye Near:    Bilateral Near:     Physical Exam Vitals and nursing note reviewed.  Constitutional:      General: He is awake. He is not in acute distress.    Appearance: Normal appearance. He is well-developed and well-groomed. He is not ill-appearing.  Cardiovascular:     Rate and Rhythm: Normal rate and regular rhythm.  Pulmonary:     Effort: Pulmonary effort is normal.     Breath sounds: Normal breath sounds.  Abdominal:      General: Abdomen is flat. Bowel sounds are normal. There is no distension.     Palpations: Abdomen is soft. There is no mass.     Tenderness: There is no abdominal tenderness. There is no guarding or rebound.     Hernia: No hernia is present.  Skin:    General: Skin is warm and dry.  Neurological:     General: No focal deficit present.     Mental Status: He is alert and oriented to person, place, and time. Mental status is at baseline.  Psychiatric:        Behavior: Behavior is cooperative.      UC Treatments / Results  Labs (all labs ordered are listed, but only abnormal results are displayed) Labs Reviewed  POC SOFIA SARS ANTIGEN FIA    EKG   Radiology No results found.  Procedures Procedures (including critical care time)  Medications Ordered in UC Medications - No data to display  Initial Impression / Assessment and Plan / UC Course  I have reviewed the triage vital signs and the nursing notes.  Pertinent labs & imaging results that were available during my care of the patient were reviewed by me and considered in my medical decision making (see chart for details).     Patient is overall well-appearing.  Vitals are stable.  Blood pressure is elevated at 138/103 today.  Patient reports that he did run out of his blood pressure medication a little while ago.  COVID testing negative.  Recommended Tylenol  as needed for pain.  Refilled lisinopril , hydroxyzine , and gabapentin .  Also given additional refill for Zoloft  and discussed the importance of taking this daily as prescribed versus how it was suggested.  Patient is understanding and agreeable to this.  Discussed importance of getting established with a primary care provider to provide additional refills  of these medications.  Given information for the behavioral urgent care as well.  Discussed follow-up and return precautions. Final Clinical Impressions(s) / UC Diagnoses   Final diagnoses:  Exposure to COVID-19  virus  Medication refill  Primary hypertension  Anxiety  Depression, unspecified depression type     Discharge Instructions      Your COVID test was negative today.  I believe your symptoms are likely related to a viral illness.  You can take 500 to 1000 mg of Tylenol  [name] 6-8 hours as needed for sore throat.  Make sure you are staying hydrated getting plenty of rest.  I have refilled your lisinopril  as previously prescribed that you will take twice daily for your blood pressure. I have also refilled your prescription for hydroxyzine  to take [name] 8 hours as needed for anxiety. I have also refilled your prescription for gabapentin  that you can take 3 times daily for your chronic sciatica.  I have also provided you additional refill on your Zoloft .  Please be sure to only take 1 tablet of this once daily.  Follow-up with primary care provider for any additional refills of this.  You can follow-up with the behavioral health urgent care as they are open 24/7 if you would like to see about getting restarted on this medication or any other medications for your history of depression anxiety.  If you need to schedule an appointment with a new primary care provider I have attached a family medicine doctor in the area that you can call to schedule an appointment with if needed. Otherwise you can follow-up with your previous primary care provider that you are seeing.     ED Prescriptions     Medication Sig Dispense Auth. Provider   lisinopril  (ZESTRIL ) 20 MG tablet Take 1 tablet (20 mg total) by mouth in the morning and at bedtime. 60 tablet Johnie, Purnell Daigle A, NP   gabapentin  (NEURONTIN ) 400 MG capsule Take 1 capsule (400 mg total) by mouth 3 (three) times daily. 90 capsule Johnie, Kvion Shapley A, NP   hydrOXYzine  (ATARAX ) 50 MG tablet Take 1 tablet (50 mg total) by mouth 3 (three) times daily as needed. 90 tablet Johnie, Katrese Shell A, NP   sertraline  (ZOLOFT ) 100 MG tablet Take 1 tablet (100 mg  total) by mouth daily. 30 tablet Johnie Flaming A, NP      PDMP not reviewed this encounter.    [1]  Social History Tobacco Use   Smoking status: Former    Current packs/day: 0.00    Average packs/day: 1.0 packs/day    Types: Cigarettes    Quit date: 03/08/2021    Years since quitting: 3.1   Smokeless tobacco: Never   Tobacco comments:    Pt declines teaching  Vaping Use   Vaping status: Some Days   Substances: Nicotine , Flavoring  Substance Use Topics   Alcohol use: Not Currently   Drug use: Not Currently    Types: IV, Heroin, Cocaine, Marijuana, Methamphetamines    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)     Johnie Flaming A, NP 05/03/24 1429  "

## 2024-05-03 NOTE — Discharge Instructions (Addendum)
 Your COVID test was negative today.  I believe your symptoms are likely related to a viral illness.  You can take 500 to 1000 mg of Tylenol  every 6-8 hours as needed for sore throat.  Make sure you are staying hydrated getting plenty of rest.  I have refilled your lisinopril  as previously prescribed that you will take twice daily for your blood pressure. I have also refilled your prescription for hydroxyzine  to take every 8 hours as needed for anxiety. I have also refilled your prescription for gabapentin  that you can take 3 times daily for your chronic sciatica.  I have also provided you additional refill on your Zoloft .  Please be sure to only take 1 tablet of this once daily.  Follow-up with primary care provider for any additional refills of this.  You can follow-up with the behavioral health urgent care as they are open 24/7 if you would like to see about getting restarted on this medication or any other medications for your history of depression anxiety.  If you need to schedule an appointment with a new primary care provider I have attached a family medicine doctor in the area that you can call to schedule an appointment with if needed. Otherwise you can follow-up with your previous primary care provider that you are seeing.

## 2024-05-03 NOTE — ED Triage Notes (Signed)
 Patient is asking for a refill of Zoloft , Gabapentin , Lisinopril , Zoloft , Vistaril , and hydrochlorothiazide.  Patient is also requesting a Covid test because his room mate tested Covid positive. Patient states he has a sosre throat and a little bit of abdominal pain.
